# Patient Record
Sex: Male | Born: 1950 | Race: Black or African American | Hispanic: No | Smoking: Never smoker
Health system: Southern US, Community
[De-identification: ages and names within clinical notes are randomized; demographics above are authoritative.]

## PROBLEM LIST (undated history)

## (undated) DIAGNOSIS — M545 Low back pain, unspecified: Secondary | ICD-10-CM

## (undated) DIAGNOSIS — Z1211 Encounter for screening for malignant neoplasm of colon: Secondary | ICD-10-CM

## (undated) DIAGNOSIS — T7840XA Allergy, unspecified, initial encounter: Secondary | ICD-10-CM

## (undated) DIAGNOSIS — N183 Chronic kidney disease, stage 3 unspecified: Secondary | ICD-10-CM

## (undated) DIAGNOSIS — C182 Malignant neoplasm of ascending colon: Secondary | ICD-10-CM

## (undated) DIAGNOSIS — I639 Cerebral infarction, unspecified: Secondary | ICD-10-CM

## (undated) DIAGNOSIS — M169 Osteoarthritis of hip, unspecified: Secondary | ICD-10-CM

## (undated) DIAGNOSIS — I10 Essential (primary) hypertension: Secondary | ICD-10-CM

## (undated) DIAGNOSIS — A6000 Herpesviral infection of urogenital system, unspecified: Secondary | ICD-10-CM

## (undated) DIAGNOSIS — I129 Hypertensive chronic kidney disease with stage 1 through stage 4 chronic kidney disease, or unspecified chronic kidney disease: Secondary | ICD-10-CM

## (undated) DIAGNOSIS — E785 Hyperlipidemia, unspecified: Secondary | ICD-10-CM

## (undated) DIAGNOSIS — R1031 Right lower quadrant pain: Secondary | ICD-10-CM

## (undated) DIAGNOSIS — M1990 Unspecified osteoarthritis, unspecified site: Secondary | ICD-10-CM

## (undated) DIAGNOSIS — G8929 Other chronic pain: Secondary | ICD-10-CM

## (undated) HISTORY — DX: Low back pain: M54.5

## (undated) HISTORY — DX: Herpesviral infection of urogenital system, unspecified: A60.00

## (undated) HISTORY — DX: Chronic kidney disease, stage 3 unspecified: N18.30

## (undated) HISTORY — DX: Right lower quadrant pain: R10.31

## (undated) HISTORY — DX: Unspecified osteoarthritis, unspecified site: M19.90

## (undated) HISTORY — PX: COLONOSCOPY: SHX174

## (undated) HISTORY — DX: Hyperlipidemia, unspecified: E78.5

## (undated) HISTORY — DX: Allergy, unspecified, initial encounter: T78.40XA

## (undated) HISTORY — DX: Encounter for screening for malignant neoplasm of colon: Z12.11

## (undated) HISTORY — DX: Low back pain, unspecified: M54.50

## (undated) HISTORY — DX: Other chronic pain: G89.29

## (undated) HISTORY — DX: Essential (primary) hypertension: I10

## (undated) HISTORY — DX: Malignant neoplasm of ascending colon: C18.2

## (undated) HISTORY — DX: Hypertensive chronic kidney disease with stage 1 through stage 4 chronic kidney disease, or unspecified chronic kidney disease: I12.9

## (undated) HISTORY — DX: Osteoarthritis of hip, unspecified: M16.9

---

## 1993-08-28 DIAGNOSIS — K219 Gastro-esophageal reflux disease without esophagitis: Secondary | ICD-10-CM

## 1993-08-28 HISTORY — DX: Gastro-esophageal reflux disease without esophagitis: K21.9

## 2007-06-05 ENCOUNTER — Ambulatory Visit: Payer: Self-pay | Admitting: Gastroenterology

## 2007-06-19 ENCOUNTER — Ambulatory Visit: Payer: Self-pay | Admitting: General Surgery

## 2007-06-26 ENCOUNTER — Inpatient Hospital Stay: Payer: Self-pay | Admitting: General Surgery

## 2007-06-26 HISTORY — PX: COLON SURGERY: SHX602

## 2007-08-02 ENCOUNTER — Emergency Department: Payer: Self-pay | Admitting: Emergency Medicine

## 2009-08-28 DIAGNOSIS — Z1211 Encounter for screening for malignant neoplasm of colon: Secondary | ICD-10-CM

## 2009-08-28 DIAGNOSIS — C182 Malignant neoplasm of ascending colon: Secondary | ICD-10-CM

## 2009-08-28 HISTORY — DX: Malignant neoplasm of ascending colon: C18.2

## 2009-08-28 HISTORY — DX: Encounter for screening for malignant neoplasm of colon: Z12.11

## 2010-05-17 ENCOUNTER — Ambulatory Visit: Payer: Self-pay | Admitting: General Surgery

## 2010-05-20 LAB — PATHOLOGY REPORT

## 2011-02-03 ENCOUNTER — Ambulatory Visit: Payer: Self-pay | Admitting: Family Medicine

## 2013-03-10 ENCOUNTER — Encounter: Payer: Self-pay | Admitting: *Deleted

## 2013-03-10 DIAGNOSIS — C182 Malignant neoplasm of ascending colon: Secondary | ICD-10-CM | POA: Insufficient documentation

## 2014-01-06 ENCOUNTER — Ambulatory Visit: Payer: Self-pay | Admitting: Family Medicine

## 2015-01-29 ENCOUNTER — Other Ambulatory Visit: Payer: Self-pay | Admitting: Family Medicine

## 2015-02-20 ENCOUNTER — Other Ambulatory Visit: Payer: Self-pay | Admitting: Family Medicine

## 2015-02-23 ENCOUNTER — Other Ambulatory Visit: Payer: Self-pay | Admitting: Family Medicine

## 2015-03-09 ENCOUNTER — Telehealth: Payer: Self-pay | Admitting: Family Medicine

## 2015-03-09 NOTE — Telephone Encounter (Signed)
Pt is needing RX for his RamIpril (BLOOD PRESSURE). He said that he did have a prescription put accidentally thrown  it away. Pharm is [facility] [place]

## 2015-03-09 NOTE — Telephone Encounter (Signed)
[facility] to [facility]/15 with 1 refill. Spoke with pharmacist and she said that he has a refill on it. Please inform pt,thank you.

## 2015-03-09 NOTE — Telephone Encounter (Signed)
PT NOTIFIED OF MESSAGE BUT SAID SINCE IT WOULD BE TO EARLY TO REFILL DID NOT KNOW IF HE COULD OR NOT. GOING TO PHARM

## 2015-05-03 ENCOUNTER — Other Ambulatory Visit: Payer: Self-pay | Admitting: Family Medicine

## 2015-05-05 ENCOUNTER — Other Ambulatory Visit: Payer: Self-pay | Admitting: Family Medicine

## 2015-05-12 ENCOUNTER — Telehealth: Payer: Self-pay | Admitting: Family Medicine

## 2015-05-12 NOTE — Telephone Encounter (Signed)
Last seen: 12-11-14 and new appointment is for next week. He will be completely out of Temazepam by the weekend and is requesting that a refill be sent to [facility].

## 2015-05-13 ENCOUNTER — Telehealth: Payer: Self-pay | Admitting: Emergency Medicine

## 2015-05-13 MED ORDER — TEMAZEPAM 30 MG PO CAPS: ORAL_CAPSULE | ORAL | Status: DC

## 2015-05-13 NOTE — Telephone Encounter (Signed)
Script faxed to pharmacy for restoril

## 2015-05-17 ENCOUNTER — Encounter: Payer: Self-pay | Admitting: Family Medicine

## 2015-05-20 ENCOUNTER — Encounter: Payer: Self-pay | Admitting: Family Medicine

## 2015-06-08 ENCOUNTER — Encounter: Payer: Self-pay | Admitting: General Surgery

## 2015-06-09 ENCOUNTER — Encounter: Payer: Self-pay | Admitting: General Surgery

## 2015-06-09 ENCOUNTER — Ambulatory Visit (INDEPENDENT_AMBULATORY_CARE_PROVIDER_SITE_OTHER): Payer: Commercial Indemnity | Admitting: General Surgery

## 2015-06-09 VITALS — BP 121/68 | HR 62 | Resp 12 | Ht 71.0 in | Wt 189.0 lb

## 2015-06-09 DIAGNOSIS — Z85038 Personal history of other malignant neoplasm of large intestine: Secondary | ICD-10-CM | POA: Diagnosis not present

## 2015-06-09 MED ORDER — POLYETHYLENE GLYCOL 3350 17 GM/SCOOP PO POWD: ORAL | Status: DC

## 2015-06-09 NOTE — Patient Instructions (Addendum)
Colonoscopy A colonoscopy is an exam to look at the entire large intestine (colon). This exam can help find problems such as tumors, polyps, inflammation, and areas of bleeding. The exam takes about 1 hour.  LET [facility] CARE PROVIDER KNOW ABOUT:   Any allergies you have.  All medicines you are taking, including vitamins, herbs, eye drops, creams, and over-the-counter medicines.  Previous problems you or members of your family have had with the use of anesthetics.  Any blood disorders you have.  Previous surgeries you have had.  Medical conditions you have. RISKS AND COMPLICATIONS  Generally, this is a safe procedure. However, as with any procedure, complications can occur. Possible complications include:  Bleeding.  Tearing or rupture of the colon wall.  Reaction to medicines given during the exam.  Infection (rare). BEFORE THE PROCEDURE   Ask your health care provider about changing or stopping your regular medicines.  You may be prescribed an oral bowel prep. This involves drinking a large amount of medicated liquid, starting the day before your procedure. The liquid will cause you to have multiple loose stools until your stool is almost clear or light green. This cleans out your colon in preparation for the procedure.  Do not eat or drink anything else once you have started the bowel prep, unless your health care provider tells you it is safe to do so.  Arrange for someone to drive you home after the procedure. PROCEDURE   You will be given medicine to help you relax (sedative).  You will lie on your side with your knees bent.  A long, flexible tube with a light and camera on the end (colonoscope) will be inserted through the rectum and into the colon. The camera sends video back to a computer screen as it moves through the colon. The colonoscope also releases carbon dioxide gas to inflate the colon. This helps your health care provider see the area better.  During  the exam, your health care provider may take a small tissue sample (biopsy) to be examined under a microscope if any abnormalities are found.  The exam is finished when the entire colon has been viewed. AFTER THE PROCEDURE   Do not drive for 24 hours after the exam.  You may have a small amount of blood in your stool.  You may pass moderate amounts of gas and have mild abdominal cramping or bloating. This is caused by the gas used to inflate your colon during the exam.  Ask when your test results will be ready and how you will get your results. Make sure you get your test results.   This information is not intended to replace advice given to you by your health care provider. Make sure you discuss any questions you have with your health care provider.   Document Released: 08/11/2000 Document Revised: 06/04/2013 Document Reviewed: 04/21/2013 Elsevier Interactive Patient Education [employer].  Patient has been scheduled for a colonoscopy on 08-04-15 at [facility].

## 2015-06-09 NOTE — Progress Notes (Signed)
Patient ID: Sundiata Ferrick, male   DOB: 17-Mar-1951, 64 y.o.   MRN: [id number]  Chief Complaint  Patient presents with  . Other    colonoscopy    HPI Kingson Lohmeyer is a 64 y.o. male here today for a evaluation of a colonoscopy. Last colonoscopy was done in 05/17/2010. He states he has been having some right side pain for about 2 weeks started 05/23/15, the pain stopped last week. Nothing triggered the pain, he noticed when he pushed on it was tender. The patient does notice after eating spicy foods or choloclate he will have acid reflux. He moves his bowels daily, he reports once every 6 weeks he will have 2 or more loose stools. This pattern has been present for a couple of years without progression.  HPI  Past Medical History  Diagnosis Date  . Hypertension   . Arthritis   . Abdominal pain, right lower quadrant   . Malignant neoplasm of ascending colon ([place]) 2011  . Special screening for malignant neoplasms, colon 2011  . Degenerative joint disease (DJD) of hip     Past Surgical History  Procedure Laterality Date  . Colonoscopy  0175,1025  . Colon surgery  06/26/2007    right hemicolectomy-T2N0 carcinoma of right colon. The tumor was 2.6 cm,low grade with zero of 14 nodes positive. No evidence of venous or lymphatic invasion    Family History  Problem Relation Age of Onset  . Cancer Brother     cancer of the thymus  . Cancer Maternal Aunt     breast cancer    Social History Social History  Substance Use Topics  . Smoking status: Never Smoker   . Smokeless tobacco: None  . Alcohol Use: 0.6 - 1.2 oz/week    1-2 Standard drinks or equivalent per week     Comment: drinks rarely    Allergies  Allergen Reactions  . Ciprofloxacin     Tendon damage    Current Outpatient Prescriptions  Medication Sig Dispense Refill  . amLODipine-benazepril (LOTREL) 10-20 MG capsule Take 1 capsule by mouth daily.    . ramipril (ALTACE) 10 MG capsule TAKE ONE CAPSULE BY MOUTH  TWICE A DAY 90 capsule 1  . temazepam (RESTORIL) 30 MG capsule TAKE 1 TABLET BY MOUTH AT BEDTIME 30 capsule 0  . valACYclovir (VALTREX) 1000 MG tablet Take 1,000 mg by mouth daily.  1  . polyethylene glycol powder (GLYCOLAX/MIRALAX) powder 255 grams one bottle for colonoscopy prep 255 g 0   No current facility-administered medications for this visit.    Review of Systems Review of Systems  Constitutional: Negative.   Respiratory: Negative.   Cardiovascular: Negative.   Gastrointestinal: Negative.     Blood pressure 121/68, pulse 62, resp. rate 12, height 5\' 11"  (1.803 m), weight 189 lb (85.73 kg).  Physical Exam Physical Exam  Constitutional: He is oriented to person, place, and time. He appears well-developed and well-nourished.  HENT:  Mouth/Throat: Oropharynx is clear and moist.  Eyes: Conjunctivae are normal. No scleral icterus.  Cardiovascular: Normal rate and regular rhythm.   Pulmonary/Chest: Effort normal and breath sounds normal.  Abdominal: Soft. Bowel sounds are normal. There is tenderness (minimal right lower quadrant).  Thrombosed vein right anterior abdominal wall.  Neurological: He is alert and oriented to person, place, and time.  Skin: Skin is warm and dry.    Data Reviewed 2011 colonoscopy.  Assessment    Candidate for follow-up colonoscopy. History intermittent diarrhea.    Plan  Biopsies of the colon will be obtained to assess for colitis, although it's difficult to explain 6 week intervals between episodes.     Colonoscopy with possible biopsy/polypectomy prn: Information regarding the procedure, including its potential risks and complications (including but not limited to perforation of the bowel, which may require emergency surgery to repair, and bleeding) was verbally given to the patient. Educational information regarding lower intestinal endoscopy was given to the patient. Written instructions for how to complete the bowel prep using Miralax  were provided. The importance of drinking ample fluids to avoid dehydration as a result of the prep emphasized.  Patient has been scheduled for a colonoscopy on 08-04-15 at [facility].   PCP:  Mamie Laurel 06/10/2015, 6:17 AM

## 2015-06-10 ENCOUNTER — Encounter: Payer: Self-pay | Admitting: General Surgery

## 2015-06-10 DIAGNOSIS — Z85038 Personal history of other malignant neoplasm of large intestine: Secondary | ICD-10-CM | POA: Insufficient documentation

## 2015-06-11 ENCOUNTER — Other Ambulatory Visit: Payer: Self-pay | Admitting: Family Medicine

## 2015-06-15 ENCOUNTER — Other Ambulatory Visit: Payer: Self-pay | Admitting: Family Medicine

## 2015-06-16 NOTE — Telephone Encounter (Signed)
Left patient a voice message to return call to schedule appointment

## 2015-06-18 ENCOUNTER — Ambulatory Visit (INDEPENDENT_AMBULATORY_CARE_PROVIDER_SITE_OTHER): Payer: Managed Care, Other (non HMO) | Admitting: Family Medicine

## 2015-06-18 ENCOUNTER — Encounter: Payer: Self-pay | Admitting: Family Medicine

## 2015-06-18 VITALS — BP 118/68 | HR 62 | Temp 97.8°F | Resp 16 | Ht 71.0 in | Wt 189.1 lb

## 2015-06-18 DIAGNOSIS — Z1211 Encounter for screening for malignant neoplasm of colon: Secondary | ICD-10-CM | POA: Diagnosis not present

## 2015-06-18 DIAGNOSIS — Z23 Encounter for immunization: Secondary | ICD-10-CM | POA: Diagnosis not present

## 2015-06-18 DIAGNOSIS — Z0000 Encounter for general adult medical examination without abnormal findings: Secondary | ICD-10-CM

## 2015-06-18 MED ORDER — TEMAZEPAM 30 MG PO CAPS: 30.0000 mg | ORAL_CAPSULE | Freq: Every day | ORAL | Status: DC

## 2015-06-18 NOTE — Patient Instructions (Signed)
Irritable Bowel Syndrome, Adult Irritable bowel syndrome (IBS) is not one specific disease. It is a group of symptoms that affects the organs responsible for digestion (gastrointestinal or GI tract).  To regulate how your GI tract works, your body sends signals back and forth between your intestines and your brain. If you have IBS, there may be a problem with these signals. As a result, your GI tract does not function normally. Your intestines may become more sensitive and overreact to certain things. This is especially true when you eat certain foods or when you are under stress.  There are four types of IBS. These may be determined based on the consistency of your stool:   IBS with diarrhea.   IBS with constipation.   Mixed IBS.   Unsubtyped IBS.  It is important to know which type of IBS you have. Some treatments are more likely to be helpful for certain types of IBS.  CAUSES  The exact cause of IBS is not known. RISK FACTORS You may have a higher risk of IBS if:  You are a woman.  You are younger than 63 years old.  You have a family history of IBS.  You have mental health problems.  You have had bacterial infection of your GI tract. SIGNS AND SYMPTOMS  Symptoms of IBS vary from person to person. The main symptom is abdominal pain or discomfort. Additional symptoms usually include one or more of the following:   Diarrhea, constipation, or both.   Abdominal swelling or bloating.   Feeling full or sick after eating a small or regular-size meal.   Frequent gas.   Mucus in the stool.   A feeling of having more stool left after a bowel movement.  Symptoms tend to come and go. They may be associated with stress, psychiatric conditions, or nothing at all.  DIAGNOSIS  There is no specific test to diagnose IBS. Your health care provider will make a diagnosis based on a physical exam, medical history, and your symptoms. You may have other tests to rule out other  conditions that may be causing your symptoms. These may include:   Blood tests.   X-rays.   CT scan.  Endoscopy and colonoscopy. This is a test in which your GI tract is viewed with a long, thin, flexible tube. TREATMENT There is no cure for IBS, but treatment can help relieve symptoms. IBS treatment often includes:   Changes to your diet, such as:  Eating more fiber.  Avoiding foods that cause symptoms.  Drinking more water.  Eating regular, medium-sized portioned meals.  Medicines. These may include:  Fiber supplements if you have constipation.  Medicine to control diarrhea (antidiarrheal medicines).  Medicine to help control muscle spasms in your GI tract (antispasmodic medicines).  Medicines to help with any mental health issues, such as antidepressants or tranquilizers.  Therapy.  Talk therapy may help with anxiety, depression, or other mental health issues that can make IBS symptoms worse.  Stress reduction.  Managing your stress can help keep symptoms under control. HOME CARE INSTRUCTIONS   Take medicines only as directed by your health care provider.  Eat a healthy diet.  Avoid foods and drinks with added sugar.  Include more whole grains, fruits, and vegetables gradually into your diet. This may be especially helpful if you have IBS with constipation.  Avoid any foods and drinks that make your symptoms worse. These may include dairy products and caffeinated or carbonated drinks.  Do not eat large meals.    Drink enough fluid to keep your urine clear or pale yellow.  Exercise regularly. Ask your health care provider for recommendations of good activities for you.  Keep all follow-up visits as directed by your health care provider. This is important. SEEK MEDICAL CARE IF:   You have constant pain.  You have trouble or pain with swallowing.  You have worsening diarrhea. SEEK IMMEDIATE MEDICAL CARE IF:   You have severe and worsening abdominal  pain.   You have diarrhea and:   You have a rash, stiff neck, or severe headache.   You are irritable, sleepy, or difficult to awaken.   You are weak, dizzy, or extremely thirsty.   You have bright red blood in your stool or you have black tarry stools.   You have unusual abdominal swelling that is painful.   You vomit continuously.   You vomit blood (hematemesis).   You have both abdominal pain and a fever.    This information is not intended to replace advice given to you by your health care provider. Make sure you discuss any questions you have with your health care provider.   Document Released: 08/14/2005 Document Revised: 09/04/2014 Document Reviewed: 05/01/2014 Elsevier Interactive Patient Education 2016 Elsevier Inc.  

## 2015-06-18 NOTE — Addendum Note (Signed)
Addended by: Ashok Norris on: 06/18/2015 10:24 AM   Modules accepted: Orders, SmartSet

## 2015-06-18 NOTE — Progress Notes (Signed)
Name: Frederick Nelson   MRN: [id number]    DOB: 1951/01/07   Date:06/18/2015       Progress Note  Subjective  Chief Complaint  Chief Complaint  Patient presents with  . Annual Exam    HPI   Patient here for  annualH&P.    Baseline problems are stable.  Past Medical History  Diagnosis Date  . Hypertension   . Arthritis   . Abdominal pain, right lower quadrant   . Malignant neoplasm of ascending colon ([place]) 2011  . Special screening for malignant neoplasms, colon 2011  . Degenerative joint disease (DJD) of hip     Social History  Substance Use Topics  . Smoking status: Never Smoker   . Smokeless tobacco: Not on file  . Alcohol Use: 0.6 - 1.2 oz/week    1-2 Standard drinks or equivalent per week     Comment: drinks rarely     Current outpatient prescriptions:  .  amLODipine (NORVASC) 5 MG tablet, TAKE 1 TABLET BY MOUTH DAILY, Disp: 30 tablet, Rfl: 7 .  temazepam (RESTORIL) 30 MG capsule, TAKE 1 CAPSULE BY MOUTH AT BEDTIME, Disp: 30 capsule, Rfl: 0 .  valACYclovir (VALTREX) 1000 MG tablet, TAKE 1 TABLET BY MOUTH EVERY DAY, Disp: 90 tablet, Rfl: 0 .  amLODipine-benazepril (LOTREL) 10-20 MG capsule, Take 1 capsule by mouth daily., Disp: , Rfl:  .  polyethylene glycol powder (GLYCOLAX/MIRALAX) powder, 255 grams one bottle for colonoscopy prep, Disp: 255 g, Rfl: 0 .  ramipril (ALTACE) 10 MG capsule, TAKE ONE CAPSULE BY MOUTH TWICE A DAY, Disp: 90 capsule, Rfl: 1  Allergies  Allergen Reactions  . Ciprofloxacin     Tendon damage    Review of Systems  Constitutional: Negative for fever, chills and weight loss.  HENT: Negative for congestion, hearing loss, sore throat and tinnitus.   Eyes: Negative for blurred vision, double vision and redness.  Respiratory: Negative for cough, hemoptysis and shortness of breath.   Cardiovascular: Negative for chest pain, palpitations, orthopnea, claudication and leg swelling.  Gastrointestinal: Negative for heartburn, nausea,  vomiting, diarrhea, constipation and blood in stool.  Genitourinary: Negative for dysuria, urgency, frequency and hematuria.       ED  Musculoskeletal: Negative for myalgias, back pain, joint pain, falls and neck pain.  Skin: Negative for itching.  Neurological: Negative for dizziness, tingling, tremors, focal weakness, seizures, loss of consciousness, weakness and headaches.  Endo/Heme/Allergies: Does not bruise/bleed easily.  Psychiatric/Behavioral: Negative for depression and substance abuse. The patient has insomnia. The patient is not nervous/anxious.      Objective  Filed Vitals:   06/18/15 0831  BP: 118/68  Pulse: 62  Temp: 97.8 F (36.6 C)  Resp: 16  Height: 5\' 11"  (1.803 m)  Weight: 189 lb 1 oz (85.758 kg)  SpO2: 99%     Physical Exam  Constitutional: He is oriented to person, place, and time and well-developed, well-nourished, and in no distress.  HENT:  Head: Normocephalic.  Eyes: EOM are normal. Pupils are equal, round, and reactive to light.  Neck: Normal range of motion. Neck supple. No thyromegaly present.  Cardiovascular: Normal rate, regular rhythm and normal heart sounds.   No murmur heard. Pulmonary/Chest: Effort normal and breath sounds normal. No respiratory distress. He has no wheezes.  Abdominal: Soft. Bowel sounds are normal.  Genitourinary: Rectum normal, prostate normal and penis normal. Guaiac negative stool. No discharge found.  Musculoskeletal: Normal range of motion. He exhibits no edema.  Lymphadenopathy:  He has no cervical adenopathy.  Neurological: He is alert and oriented to person, place, and time. No cranial nerve deficit. Gait normal. Coordination normal.  Skin: Skin is warm and dry. No rash noted.  Psychiatric: Affect and judgment normal.      Assessment & Plan  1. Annual physical exam  - Comprehensive Metabolic Panel (CMET) - CBC - Lipid Profile - PSA - TSH  2. Need for influenza vaccination Given - Flu Vaccine QUAD  36+ mos PF IM (Fluarix & Fluzone Quad PF)

## 2015-06-19 LAB — CBC
Hematocrit: 44.5 % (ref 37.5–51.0)
Hemoglobin: 15.1 g/dL (ref 12.6–17.7)
MCH: 31.6 pg (ref 26.6–33.0)
MCHC: 33.9 g/dL (ref 31.5–35.7)
MCV: 93 fL (ref 79–97)
PLATELETS: 168 10*3/uL (ref 150–379)
RBC: 4.78 x10E6/uL (ref 4.14–5.80)
RDW: 13 % (ref 12.3–15.4)
WBC: 4.3 10*3/uL (ref 3.4–10.8)

## 2015-06-19 LAB — COMPREHENSIVE METABOLIC PANEL
A/G RATIO: 1.8 (ref 1.1–2.5)
ALBUMIN: 4.4 g/dL (ref 3.6–4.8)
ALK PHOS: 60 IU/L (ref 39–117)
ALT: 20 IU/L (ref 0–44)
AST: 20 IU/L (ref 0–40)
BILIRUBIN TOTAL: 0.5 mg/dL (ref 0.0–1.2)
BUN / CREAT RATIO: 11 (ref 10–22)
BUN: 17 mg/dL (ref 8–27)
CHLORIDE: 103 mmol/L (ref 97–106)
CO2: 26 mmol/L (ref 18–29)
CREATININE: 1.56 mg/dL — AB (ref 0.76–1.27)
Calcium: 9.3 mg/dL (ref 8.6–10.2)
GFR calc Af Amer: 54 mL/min/{1.73_m2} — ABNORMAL LOW (ref >59)
GFR calc non Af Amer: 47 mL/min/{1.73_m2} — ABNORMAL LOW (ref >59)
GLOBULIN, TOTAL: 2.4 g/dL (ref 1.5–4.5)
Glucose: 90 mg/dL (ref 65–99)
POTASSIUM: 4.7 mmol/L (ref 3.5–5.2)
SODIUM: 142 mmol/L (ref 136–144)
Total Protein: 6.8 g/dL (ref 6.0–8.5)

## 2015-06-19 LAB — LIPID PANEL
CHOLESTEROL TOTAL: 173 mg/dL (ref 100–199)
Chol/HDL Ratio: 2.8 ratio units (ref 0.0–5.0)
HDL: 62 mg/dL (ref >39)
LDL Calculated: 99 mg/dL (ref 0–99)
Triglycerides: 59 mg/dL (ref 0–149)
VLDL Cholesterol Cal: 12 mg/dL (ref 5–40)

## 2015-06-19 LAB — TSH: TSH: 1.91 u[IU]/mL (ref 0.450–4.500)

## 2015-06-19 LAB — PSA: PROSTATE SPECIFIC AG, SERUM: 1.1 ng/mL (ref 0.0–4.0)

## 2015-07-09 ENCOUNTER — Other Ambulatory Visit: Payer: Self-pay | Admitting: Family Medicine

## 2015-07-10 ENCOUNTER — Other Ambulatory Visit: Payer: Self-pay | Admitting: Family Medicine

## 2015-07-28 ENCOUNTER — Telehealth: Payer: Self-pay | Admitting: *Deleted

## 2015-07-28 NOTE — Telephone Encounter (Signed)
Patient was contacted today to confirm medications. Medication list was updated accordingly per patient.  He states he has yet to pick up Miralax but was instructed to do so.   We will proceed with colonoscopy as scheduled for 08-04-15 at [facility].  Patient was instructed to call the office if he has further questions.

## 2015-08-03 ENCOUNTER — Encounter: Payer: Self-pay | Admitting: *Deleted

## 2015-08-04 ENCOUNTER — Encounter: Payer: Self-pay | Admitting: Anesthesiology

## 2015-08-04 ENCOUNTER — Encounter
Admission: RE | Disposition: A | Discharge status: Home / Self Care | Payer: Self-pay | Source: Ambulatory Visit | Attending: General Surgery

## 2015-08-04 ENCOUNTER — Ambulatory Visit: Payer: Managed Care, Other (non HMO) | Admitting: Anesthesiology

## 2015-08-04 ENCOUNTER — Ambulatory Visit
Admission: RE | Admit: 2015-08-04 | Discharge: 2015-08-04 | Disposition: A | Discharge status: Home / Self Care | Payer: Managed Care, Other (non HMO) | Source: Ambulatory Visit | Attending: General Surgery | Admitting: General Surgery

## 2015-08-04 DIAGNOSIS — M1990 Unspecified osteoarthritis, unspecified site: Secondary | ICD-10-CM | POA: Diagnosis not present

## 2015-08-04 DIAGNOSIS — I10 Essential (primary) hypertension: Secondary | ICD-10-CM | POA: Diagnosis not present

## 2015-08-04 DIAGNOSIS — Z79899 Other long term (current) drug therapy: Secondary | ICD-10-CM | POA: Diagnosis not present

## 2015-08-04 DIAGNOSIS — Z85038 Personal history of other malignant neoplasm of large intestine: Secondary | ICD-10-CM | POA: Insufficient documentation

## 2015-08-04 DIAGNOSIS — M1610 Unilateral primary osteoarthritis, unspecified hip: Secondary | ICD-10-CM | POA: Diagnosis not present

## 2015-08-04 DIAGNOSIS — Z9049 Acquired absence of other specified parts of digestive tract: Secondary | ICD-10-CM | POA: Insufficient documentation

## 2015-08-04 DIAGNOSIS — Z881 Allergy status to other antibiotic agents status: Secondary | ICD-10-CM | POA: Diagnosis not present

## 2015-08-04 HISTORY — PX: COLONOSCOPY WITH PROPOFOL: SHX5780

## 2015-08-04 SURGERY — COLONOSCOPY WITH PROPOFOL
Anesthesia: General

## 2015-08-04 MED ORDER — SODIUM CHLORIDE 0.9 % IV SOLN
INTRAVENOUS | Status: DC
  Administered 2015-08-04: 10:00:00 via INTRAVENOUS

## 2015-08-04 MED ORDER — FENTANYL CITRATE (PF) 100 MCG/2ML IJ SOLN
INTRAMUSCULAR | Status: DC | PRN
  Administered 2015-08-04: 50 ug via INTRAVENOUS

## 2015-08-04 MED ORDER — MIDAZOLAM HCL 2 MG/2ML IJ SOLN
INTRAMUSCULAR | Status: DC | PRN
  Administered 2015-08-04: 1 mg via INTRAVENOUS

## 2015-08-04 MED ORDER — PROPOFOL 500 MG/50ML IV EMUL
INTRAVENOUS | Status: DC | PRN
  Administered 2015-08-04: 120 ug/kg/min via INTRAVENOUS

## 2015-08-04 NOTE — Anesthesia Procedure Notes (Signed)
Performed by: COOK-MARTIN, Shizue Kaseman Pre-anesthesia Checklist: Patient identified, Emergency Drugs available, Suction available, Patient being monitored and Timeout performed Patient Re-evaluated:Patient Re-evaluated prior to inductionOxygen Delivery Method: Nasal cannula Preoxygenation: Pre-oxygenation with 100% oxygen Intubation Type: IV induction Placement Confirmation: positive ETCO2 and CO2 detector       

## 2015-08-04 NOTE — H&P (Signed)
Frederick Nelson [id number] [birth year]     HPI: Prior right hemicolectomy for cancer. Followup exam.   Prescriptions prior to admission  Medication Sig Dispense Refill Last Dose  . amLODipine (NORVASC) 5 MG tablet TAKE 1 TABLET BY MOUTH DAILY 30 tablet 7 08/04/2015 at 0530  . ramipril (ALTACE) 10 MG capsule TAKE ONE CAPSULE BY MOUTH TWICE A DAY 90 capsule 0 08/04/2015 at [place] time  . CIALIS 20 MG tablet TAKE 1 TABLET BY MOUTH AS NEEDED 90 tablet 0   . polyethylene glycol powder (GLYCOLAX/MIRALAX) powder 255 grams one bottle for colonoscopy prep 255 g 0   . temazepam (RESTORIL) 30 MG capsule Take 1 capsule (30 mg total) by mouth at bedtime. 30 capsule 5   . valACYclovir (VALTREX) 1000 MG tablet TAKE 1 TABLET BY MOUTH EVERY DAY 90 tablet 0 Taking   Allergies  Allergen Reactions  . Ciprofloxacin     Tendon damage   Past Medical History  Diagnosis Date  . Hypertension   . Arthritis   . Abdominal pain, right lower quadrant   . Malignant neoplasm of ascending colon ([place]) 2011  . Special screening for malignant neoplasms, colon 2011  . Degenerative joint disease (DJD) of hip    Past Surgical History  Procedure Laterality Date  . Colonoscopy  [id number]  . Colon surgery  06/26/2007    right hemicolectomy-T2N0 carcinoma of right colon. The tumor was 2.6 cm,low grade with zero of 14 nodes positive. No evidence of venous or lymphatic invasion   Social History   Social History  . Marital Status: Divorced    Spouse Name: N/A  . Number of Children: N/A  . Years of Education: N/A   Occupational History  . Not on file.   Social History Main Topics  . Smoking status: Never Smoker   . Smokeless tobacco: Never Used  . Alcohol Use: 0.6 - 1.2 oz/week    1-2 Standard drinks or equivalent per week     Comment: drinks rarely  . Drug Use: No  . Sexual Activity: Not on file   Other Topics Concern  . Not on file   Social History Narrative   Social History   Social History  Narrative     ROS: Negative.     PE: HEENT: Negative. Lungs: Clear. Cardio: RR. [name] 08/04/2015   Assessment/Plan:  Proceed with planned endoscopy.

## 2015-08-04 NOTE — Op Note (Signed)
[facility] Patient Name: Frederick Nelson Procedure Date: 08/04/2015 10:26 AM MRN: [id number] Account #: [id number] Date of Birth: 11-27-1950 Admit Type: Outpatient Age: 64 Room: [facility] -  ENDO ROOM 1 Gender: Male Note Status: Finalized Procedure:         Colonoscopy Indications:       High risk colon cancer surveillance: Personal history of                     colon cancer Providers:         [name], MD Referring MD:      Ashok Norris, MD (Referring MD) Medicines:         Monitored Anesthesia Care Complications:     No immediate complications. Procedure:         Pre-Anesthesia Assessment:                    - Prior to the procedure, a History and Physical was                     performed, and patient medications, allergies and                     sensitivities were reviewed. The patient's tolerance of                     previous anesthesia was reviewed.                    - The risks and benefits of the procedure and the sedation                     options and risks were discussed with the patient. All                     questions were answered and informed consent was obtained.                    After obtaining informed consent, the colonoscope was                     passed under direct vision. Throughout the procedure, the                     patient's blood pressure, pulse, and oxygen saturations                     were monitored continuously. The Olympus CF-H180AL                     colonoscope ( S#: [id number] ) was introduced through the                     anus and advanced to the the ileocolonic anastomosis. The                     colonoscopy was performed without difficulty. The patient                     tolerated the procedure well. The quality of the bowel                     preparation was excellent. Findings:      The entire examined colon appeared normal on direct and retroflexion  views. Impression:         - The entire examined colon is normal on direct and                     retroflexion views.                    - No specimens collected. Recommendation:    - Repeat colonoscopy in 5 years for surveillance. Procedure Code(s): --- Professional ---                    [phone], Colonoscopy, flexible; diagnostic, including                     collection of specimen(s) by brushing or washing, when                     performed (separate procedure) Diagnosis Code(s): --- Professional ---                    [id number], Personal history of other malignant neoplasm of                     large intestine CPT copyright 2014 American Medical Association. All rights reserved. The codes documented in this report are preliminary and upon coder review may  be revised to meet current compliance requirements. [name], MD 08/04/2015 10:53:48 AM This report has been signed electronically. Number of Addenda: 0 Note Initiated On: 08/04/2015 10:26 AM Scope Withdrawal Time: 0 hours 7 minutes 22 seconds  Total Procedure Duration: 0 hours 11 minutes 28 seconds       [facility]

## 2015-08-04 NOTE — Anesthesia Postprocedure Evaluation (Signed)
Anesthesia Post Note  Patient: Frederick Nelson  Procedure(s) Performed: Procedure(s) (LRB): COLONOSCOPY WITH PROPOFOL (N/A)  Patient location during evaluation: Endoscopy Anesthesia Type: General Level of consciousness: awake and alert Pain management: pain level controlled Vital Signs Assessment: post-procedure vital signs reviewed and stable Respiratory status: spontaneous breathing, nonlabored ventilation, respiratory function stable and patient connected to nasal cannula oxygen Cardiovascular status: blood pressure returned to baseline and stable Postop Assessment: no signs of nausea or vomiting Anesthetic complications: no    Last Vitals:  Filed Vitals:   08/04/15 1115 08/04/15 1125  BP: 140/74 147/92  Pulse: 55 50  Temp:    Resp: 13 15    Last Pain: There were no vitals filed for this visit.               Precious Haws Roshana Shuffield

## 2015-08-04 NOTE — Anesthesia Preprocedure Evaluation (Signed)
Anesthesia Evaluation  Patient identified by MRN, date of birth, ID band Patient awake    Reviewed: Allergy & Precautions, H&P , NPO status , Patient's Chart, lab work & pertinent test results  History of Anesthesia Complications Negative for: history of anesthetic complications  Airway Mallampati: III  TM Distance: >3 FB Neck ROM: full    Dental no notable dental hx. (+) Teeth Intact   Pulmonary neg pulmonary ROS, neg shortness of breath,    Pulmonary exam normal breath sounds clear to auscultation       Cardiovascular Exercise Tolerance: Good hypertension, (-) angina(-) Past MI and (-) DOE Normal cardiovascular exam Rhythm:regular Rate:Normal     Neuro/Psych negative neurological ROS  negative psych ROS   GI/Hepatic Neg liver ROS, GERD  Controlled,  Endo/Other  negative endocrine ROS  Renal/GU negative Renal ROS  negative genitourinary   Musculoskeletal   Abdominal   Peds  Hematology negative hematology ROS (+)   Anesthesia Other Findings Past Medical History:   Hypertension                                                 Arthritis                                                    Abdominal pain, right lower quadrant                         Malignant neoplasm of ascending colon ([place])     2011         Special screening for malignant neoplasms, col* 2011         Degenerative joint disease (DJD) of hip                     Past Surgical History:   COLONOSCOPY                                      2008,2011    COLON SURGERY                                    06/26/2007     Comment:right hemicolectomy-T2N0 carcinoma of right               colon. The tumor was 2.6 cm,low grade with zero              of 14 nodes positive. No evidence of venous or               lymphatic invasion     Reproductive/Obstetrics negative OB ROS                             Anesthesia Physical Anesthesia  Plan  ASA: III  Anesthesia Plan: General   Post-op Pain Management:    Induction:   Airway Management Planned:   Additional Equipment:   Intra-op Plan:   Post-operative Plan:   Informed Consent: I have reviewed the patients  History and Physical, chart, labs and discussed the procedure including the risks, benefits and alternatives for the proposed anesthesia with the patient or authorized representative who has indicated his/her understanding and acceptance.   Dental Advisory Given  Plan Discussed with: Anesthesiologist, CRNA and Surgeon  Anesthesia Plan Comments:         Anesthesia Quick Evaluation

## 2015-08-04 NOTE — Transfer of Care (Signed)
Immediate Anesthesia Transfer of Care Note  Patient: Frederick Nelson  Procedure(s) Performed: Procedure(s): COLONOSCOPY WITH PROPOFOL (N/A)  Patient Location: PACU  Anesthesia Type:General  Level of Consciousness: awake and sedated  Airway & Oxygen Therapy: Patient Spontanous Breathing and Patient connected to nasal cannula oxygen  Post-op Assessment: Report given to RN and Post -op Vital signs reviewed and stable  Post vital signs: Reviewed and stable  Last Vitals:  Filed Vitals:   08/04/15 1012  BP: 130/79  Pulse: 53  Temp: 36 C  Resp: 18    Complications: No apparent anesthesia complications

## 2015-08-15 ENCOUNTER — Other Ambulatory Visit: Payer: Self-pay | Admitting: Family Medicine

## 2015-09-27 ENCOUNTER — Other Ambulatory Visit: Payer: Self-pay | Admitting: Family Medicine

## 2015-09-28 NOTE — Telephone Encounter (Signed)
Patient requesting refill. 

## 2015-10-16 ENCOUNTER — Other Ambulatory Visit: Payer: Self-pay | Admitting: Family Medicine

## 2015-10-21 ENCOUNTER — Other Ambulatory Visit: Payer: Self-pay

## 2015-10-21 MED ORDER — RAMIPRIL 10 MG PO CAPS: 10.0000 mg | ORAL_CAPSULE | Freq: Two times a day (BID) | ORAL | Status: DC

## 2015-12-20 ENCOUNTER — Ambulatory Visit: Payer: Managed Care, Other (non HMO) | Admitting: Family Medicine

## 2015-12-27 ENCOUNTER — Other Ambulatory Visit: Payer: Self-pay | Admitting: Family Medicine

## 2015-12-27 ENCOUNTER — Ambulatory Visit: Payer: Managed Care, Other (non HMO) | Admitting: Family Medicine

## 2015-12-28 ENCOUNTER — Other Ambulatory Visit: Payer: Self-pay | Admitting: Family Medicine

## 2016-01-02 ENCOUNTER — Other Ambulatory Visit: Payer: Self-pay | Admitting: Family Medicine

## 2016-01-05 ENCOUNTER — Telehealth: Payer: Self-pay | Admitting: Family Medicine

## 2016-01-05 NOTE — Telephone Encounter (Signed)
Dan from [facility] requesting refill on Temazepam. Patient is completely out.

## 2016-01-15 ENCOUNTER — Other Ambulatory Visit: Payer: Self-pay | Admitting: Family Medicine

## 2016-01-26 ENCOUNTER — Encounter: Payer: Self-pay | Admitting: Family Medicine

## 2016-01-26 ENCOUNTER — Ambulatory Visit (INDEPENDENT_AMBULATORY_CARE_PROVIDER_SITE_OTHER): Payer: Managed Care, Other (non HMO) | Admitting: Family Medicine

## 2016-01-26 VITALS — BP 140/76 | HR 60 | Temp 98.1°F | Resp 16 | Ht 71.0 in | Wt 186.3 lb

## 2016-01-26 DIAGNOSIS — N183 Chronic kidney disease, stage 3 unspecified: Secondary | ICD-10-CM | POA: Insufficient documentation

## 2016-01-26 DIAGNOSIS — I10 Essential (primary) hypertension: Secondary | ICD-10-CM | POA: Diagnosis not present

## 2016-01-26 DIAGNOSIS — G4700 Insomnia, unspecified: Secondary | ICD-10-CM

## 2016-01-26 DIAGNOSIS — R600 Localized edema: Secondary | ICD-10-CM | POA: Diagnosis not present

## 2016-01-26 DIAGNOSIS — F5104 Psychophysiologic insomnia: Secondary | ICD-10-CM | POA: Insufficient documentation

## 2016-01-26 DIAGNOSIS — I129 Hypertensive chronic kidney disease with stage 1 through stage 4 chronic kidney disease, or unspecified chronic kidney disease: Secondary | ICD-10-CM | POA: Insufficient documentation

## 2016-01-26 MED ORDER — RAMIPRIL 10 MG PO CAPS: 10.0000 mg | ORAL_CAPSULE | Freq: Two times a day (BID) | ORAL | Status: DC

## 2016-01-26 MED ORDER — TEMAZEPAM 30 MG PO CAPS: 30.0000 mg | ORAL_CAPSULE | Freq: Every day | ORAL | Status: DC

## 2016-01-26 NOTE — Progress Notes (Signed)
Name: Frederick Nelson   MRN: [id number]    DOB: Sep 07, 1950   Date:01/26/2016       Progress Note  Subjective  Chief Complaint  Chief Complaint  Patient presents with  . Hypertension    medication refill follow up  . Insomnia    med ication refill    Hypertension This is a chronic problem. The problem is unchanged. The problem is controlled. Pertinent negatives include no chest pain, headaches or palpitations. Past treatments include calcium channel blockers and ACE inhibitors.  Insomnia Primary symptoms: sleep disturbance, difficulty falling asleep, frequent awakening.  The problem occurs nightly. The symptoms are aggravated by anxiety and work stress. Past treatments include medication. Typical bedtime:  11-12 P.M..  How long after going to bed to you fall asleep: 30-60 minutes.   PMH includes: hypertension, work related stressors.     Past Medical History  Diagnosis Date  . Hypertension   . Arthritis   . Abdominal pain, right lower quadrant   . Malignant neoplasm of ascending colon ([place]) 2011  . Special screening for malignant neoplasms, colon 2011  . Degenerative joint disease (DJD) of hip     Past Surgical History  Procedure Laterality Date  . Colonoscopy  [id number]  . Colon surgery  06/26/2007    right hemicolectomy-T2N0 carcinoma of right colon. The tumor was 2.6 cm,low grade with zero of 14 nodes positive. No evidence of venous or lymphatic invasion  . Colonoscopy with propofol N/A 08/04/2015    Procedure: COLONOSCOPY WITH PROPOFOL;  Surgeon: [name], MD;  Location: [facility] ENDOSCOPY;  Service: Endoscopy;  Laterality: N/A;    Family History  Problem Relation Age of Onset  . Cancer Brother     cancer of the thymus  . Cancer Maternal Aunt     breast cancer    Social History   Social History  . Marital Status: Divorced    Spouse Name: N/A  . Number of Children: N/A  . Years of Education: N/A   Occupational History  . Not on file.   Social History  Main Topics  . Smoking status: Never Smoker   . Smokeless tobacco: Never Used  . Alcohol Use: 0.6 - 1.2 oz/week    1-2 Standard drinks or equivalent per week     Comment: drinks rarely  . Drug Use: No  . Sexual Activity: Not on file   Other Topics Concern  . Not on file   Social History Narrative     Current outpatient prescriptions:  .  amLODipine (NORVASC) 5 MG tablet, TAKE 1 TABLET BY MOUTH DAILY, Disp: 30 tablet, Rfl: 7 .  CIALIS 20 MG tablet, TAKE 1 TABLET BY MOUTH AS NEEDED, Disp: 90 tablet, Rfl: 0 .  ramipril (ALTACE) 10 MG capsule, Take 1 capsule (10 mg total) by mouth 2 (two) times daily., Disp: 180 capsule, Rfl: 0 .  temazepam (RESTORIL) 30 MG capsule, TAKE ONE CAPSULE BY MOUTH AT BEDTIME, Disp: 30 capsule, Rfl: 0 .  valACYclovir (VALTREX) 1000 MG tablet, TAKE 1 TABLET BY MOUTH EVERY DAY, Disp: 90 tablet, Rfl: 0  Allergies  Allergen Reactions  . Ciprofloxacin     Tendon damage     Review of Systems  Cardiovascular: Negative for chest pain and palpitations.  Neurological: Negative for headaches.  Psychiatric/Behavioral: Positive for sleep disturbance. The patient has insomnia.      Objective  Filed Vitals:   01/26/16 1009  BP: 140/76  Pulse: 60  Temp: 98.1 F (36.7 C)  TempSrc: Oral  Resp: 16  Height: 5\' 11"  (1.803 m)  Weight: 186 lb 4.8 oz (84.505 kg)  SpO2: 98%    Physical Exam  Constitutional: He is oriented to person, place, and time and well-developed, well-nourished, and in no distress.  HENT:  Head: Normocephalic and atraumatic.  Cardiovascular: Normal rate and regular rhythm.   Pulmonary/Chest: Effort normal and breath sounds normal.  Musculoskeletal:       Right ankle: He exhibits swelling.       Left ankle: He exhibits swelling.  1+ pitting edema bilateral LE  Neurological: He is alert and oriented to person, place, and time.  Psychiatric: Mood, memory, affect and judgment normal.  Nursing note and vitals  reviewed.      Assessment & Plan  1. Essential hypertension Pressures stable and at goal, continue present therapy - ramipril (ALTACE) 10 MG capsule; Take 1 capsule (10 mg total) by mouth 2 (two) times daily.  Dispense: 180 capsule; Refill: 0  2. Chronic insomnia Refill for temazepam is provided, continue as directed. - temazepam (RESTORIL) 30 MG capsule; Take 1 capsule (30 mg total) by mouth at bedtime.  Dispense: 30 capsule; Refill: 2  3. Bilateral leg edema Bilateral lower extremity swelling could be a side effect of amlodipine as patient reportedly took double the usual dose of amlodipine last night. We'll obtain CMP for assessment of liver and kidney function  - Comprehensive Metabolic Panel (CMET)   Aryianna Earwood [facility] 01/26/2016 10:23 AM

## 2016-01-27 LAB — COMPREHENSIVE METABOLIC PANEL
ALK PHOS: 71 IU/L (ref 39–117)
ALT: 20 IU/L (ref 0–44)
AST: 23 IU/L (ref 0–40)
Albumin/Globulin Ratio: 1.5 (ref 1.2–2.2)
Albumin: 4.8 g/dL (ref 3.6–4.8)
BUN/Creatinine Ratio: 13 (ref 10–24)
BUN: 19 mg/dL (ref 8–27)
Bilirubin Total: 0.5 mg/dL (ref 0.0–1.2)
CO2: 24 mmol/L (ref 18–29)
CREATININE: 1.49 mg/dL — AB (ref 0.76–1.27)
Calcium: 10 mg/dL (ref 8.6–10.2)
Chloride: 101 mmol/L (ref 96–106)
GFR calc Af Amer: 57 mL/min/{1.73_m2} — ABNORMAL LOW (ref >59)
GFR calc non Af Amer: 49 mL/min/{1.73_m2} — ABNORMAL LOW (ref >59)
GLUCOSE: 91 mg/dL (ref 65–99)
Globulin, Total: 3.2 g/dL (ref 1.5–4.5)
Potassium: 4.6 mmol/L (ref 3.5–5.2)
Sodium: 142 mmol/L (ref 134–144)
Total Protein: 8 g/dL (ref 6.0–8.5)

## 2016-02-08 ENCOUNTER — Other Ambulatory Visit: Payer: Self-pay | Admitting: Family Medicine

## 2016-02-11 NOTE — Telephone Encounter (Signed)
Patient verbally informed prescription has been sent to pharmacy.

## 2016-02-11 NOTE — Telephone Encounter (Signed)
Last seen on 01-26-16 and the amlodipine was not refilled at last visit. Please send refill to [facility]. Only have a couple of days left.

## 2016-03-27 ENCOUNTER — Other Ambulatory Visit: Payer: Self-pay | Admitting: Family Medicine

## 2016-04-03 ENCOUNTER — Telehealth: Payer: Self-pay | Admitting: Family Medicine

## 2016-04-03 NOTE — Telephone Encounter (Signed)
Patient was prescribed temazepam 30 day supply with 2 refills on 01/26/2016. Please schedule for an appointment for medication refill.

## 2016-04-04 NOTE — Telephone Encounter (Signed)
PT NOTIFIED AND SAID THAT HE FOUND OUT HE HAS A REFILL AND WILL KEEP HIS APPT FOR 04-27-16

## 2016-04-22 ENCOUNTER — Other Ambulatory Visit: Payer: Self-pay | Admitting: Family Medicine

## 2016-04-22 DIAGNOSIS — I10 Essential (primary) hypertension: Secondary | ICD-10-CM

## 2016-04-27 ENCOUNTER — Encounter: Payer: Self-pay | Admitting: Family Medicine

## 2016-04-27 ENCOUNTER — Ambulatory Visit
Admission: RE | Admit: 2016-04-27 | Discharge: 2016-04-27 | Disposition: A | Discharge status: Home / Self Care | Payer: Managed Care, Other (non HMO) | Source: Ambulatory Visit | Attending: Family Medicine | Admitting: Family Medicine

## 2016-04-27 ENCOUNTER — Ambulatory Visit (INDEPENDENT_AMBULATORY_CARE_PROVIDER_SITE_OTHER): Payer: Managed Care, Other (non HMO) | Admitting: Family Medicine

## 2016-04-27 VITALS — BP 134/69 | HR 60 | Temp 98.6°F | Resp 17 | Ht 71.0 in | Wt 190.8 lb

## 2016-04-27 DIAGNOSIS — M160 Bilateral primary osteoarthritis of hip: Secondary | ICD-10-CM | POA: Diagnosis not present

## 2016-04-27 DIAGNOSIS — M169 Osteoarthritis of hip, unspecified: Secondary | ICD-10-CM | POA: Insufficient documentation

## 2016-04-27 DIAGNOSIS — F5104 Psychophysiologic insomnia: Secondary | ICD-10-CM

## 2016-04-27 DIAGNOSIS — H538 Other visual disturbances: Secondary | ICD-10-CM | POA: Diagnosis not present

## 2016-04-27 DIAGNOSIS — I10 Essential (primary) hypertension: Secondary | ICD-10-CM

## 2016-04-27 DIAGNOSIS — G4700 Insomnia, unspecified: Secondary | ICD-10-CM

## 2016-04-27 MED ORDER — RAMIPRIL 10 MG PO CAPS: 10.0000 mg | ORAL_CAPSULE | Freq: Two times a day (BID) | ORAL | 1 refills | Status: DC

## 2016-04-27 MED ORDER — AMLODIPINE BESYLATE 5 MG PO TABS: 5.0000 mg | ORAL_TABLET | Freq: Every day | ORAL | 1 refills | Status: DC

## 2016-04-27 MED ORDER — CYCLOBENZAPRINE HCL 5 MG PO TABS: 5.0000 mg | ORAL_TABLET | Freq: Three times a day (TID) | ORAL | 0 refills | Status: DC | PRN

## 2016-04-27 MED ORDER — MELOXICAM 7.5 MG PO TABS: 7.5000 mg | ORAL_TABLET | Freq: Every day | ORAL | 0 refills | Status: DC

## 2016-04-27 NOTE — Progress Notes (Signed)
Name: Frederick Nelson   MRN: [id number]    DOB: 1950/12/07   Date:04/27/2016       Progress Note  Subjective  Chief Complaint  Chief Complaint  Patient presents with  . Follow-up    3 mo  . Medication Refill    Hypertension  This is a chronic problem. The problem is unchanged. The problem is controlled. Associated symptoms include blurred vision (in last 2 weeks, he had two incidents of blurred vision, resolved spontaneously, ) and headaches. Pertinent negatives include no chest pain or palpitations. Past treatments include ACE inhibitors and calcium channel blockers. There is no history of kidney disease, CAD/MI or CVA.   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 He starts getting pain in the right buttock area with any strenuous activity, which then progresses to lower back area. In the past, he has been prescribed Flexeril and Meloxicam to use as needed and he is requesting a refill today.   Past Medical History:  Diagnosis Date  . Abdominal pain, right lower quadrant   . Arthritis   . Degenerative joint disease (DJD) of hip   . Hypertension   . Malignant neoplasm of ascending colon ([place]) 2011  . Special screening for malignant neoplasms, colon 2011    Past Surgical History:  Procedure Laterality Date  . COLON SURGERY  06/26/2007   right hemicolectomy-T2N0 carcinoma of right colon. The tumor was 2.6 cm,low grade with zero of 14 nodes positive. No evidence of venous or lymphatic invasion  . COLONOSCOPY  [id number]  . COLONOSCOPY WITH PROPOFOL N/A 08/04/2015   Procedure: COLONOSCOPY WITH PROPOFOL;  Surgeon: [name], MD;  Location: [facility] ENDOSCOPY;  Service: Endoscopy;  Laterality: N/A;    Family History  Problem Relation Age of Onset  . Cancer Brother     cancer of the thymus  . Cancer Maternal Aunt     breast cancer    Social History   Social History  . Marital status: Divorced     Spouse name: N/A  . Number of children: N/A  . Years of education: N/A   Occupational History  . Not on file.   Social History Main Topics  . Smoking status: Never Smoker  . Smokeless tobacco: Never Used  . Alcohol use 0.6 - 1.2 oz/week    1 - 2 Standard drinks or equivalent per week     Comment: drinks rarely  . Drug use: No  . Sexual activity: Not on file   Other Topics Concern  . Not on file   Social History Narrative  . No narrative on file     Current Outpatient Prescriptions:  .  amLODipine (NORVASC) 5 MG tablet, TAKE 1 TABLET BY MOUTH DAILY, Disp: 30 tablet, Rfl: 7 .  CIALIS 20 MG tablet, TAKE 1 TABLET BY MOUTH AS NEEDED, Disp: 90 tablet, Rfl: 0 .  ramipril (ALTACE) 10 MG capsule, Take 1 capsule (10 mg total) by mouth 2 (two) times daily., Disp: 180 capsule, Rfl: 0 .  temazepam (RESTORIL) 30 MG capsule, Take 1 capsule (30 mg total) by mouth at bedtime., Disp: 30 capsule, Rfl: 2 .  valACYclovir (VALTREX) 1000 MG tablet, TAKE 1 TABLET BY MOUTH EVERY DAY, Disp: 90 tablet, Rfl: 0  Allergies  Allergen Reactions  . Ciprofloxacin     Tendon damage    Review of Systems  Eyes: Positive for blurred vision (in last 2 weeks,  he had two incidents of blurred vision, resolved spontaneously, ).  Cardiovascular: Negative for chest pain, palpitations and leg swelling.  Neurological: Positive for headaches.    Objective  Vitals:   04/27/16 0939  BP: 134/69  Pulse: 60  Resp: 17  Temp: 98.6 F (37 C)  TempSrc: Oral  SpO2: 98%  Weight: 190 lb 12.8 oz (86.5 kg)  Height: 5\' 11"  (1.803 m)    Physical Exam  Constitutional: He is oriented to person, place, and time and well-developed, well-nourished, and in no distress.  Eyes: Conjunctivae are normal. Pupils are equal, round, and reactive to light.  Cardiovascular: Normal rate, regular rhythm, S1 normal and S2 normal.   No murmur heard. Pulmonary/Chest: Breath sounds normal. He has no wheezes. He has no rhonchi.   Abdominal: Soft. There is no tenderness.  Musculoskeletal:       Right hip: Normal. He exhibits normal strength, no tenderness and no deformity.       Left hip: Normal. He exhibits no tenderness and no swelling.       Right ankle: He exhibits no swelling.       Left ankle: He exhibits no swelling.       Lumbar back: Normal. He exhibits no tenderness, no pain and no spasm.  Neurological: He is alert and oriented to person, place, and time.  Psychiatric: Mood, memory, affect and judgment normal.  Nursing note and vitals reviewed.   Assessment & Plan  1. Essential hypertension  - ramipril (ALTACE) 10 MG capsule; Take 1 capsule (10 mg total) by mouth 2 (two) times daily.  Dispense: 180 capsule; Refill: 1 - amLODipine (NORVASC) 5 MG tablet; Take 1 tablet (5 mg total) by mouth daily.  Dispense: 90 tablet; Refill: 1  2. Blurred vision, bilateral Recommended that he follow up with an ophthalmologist for a detailed eye exam  3. Primary osteoarthritis of both hips No recent x-rays available, we will obtain an updated x-rays of both hips, prescription for meloxicam and cyclobenzaprine provided for use on an as-needed basis - cyclobenzaprine (FLEXERIL) 5 MG tablet; Take 1 tablet (5 mg total) by mouth 3 (three) times daily as needed for muscle spasms.  Dispense: 90 tablet; Refill: 0 - meloxicam (MOBIC) 7.5 MG tablet; Take 1 tablet (7.5 mg total) by mouth daily.  Dispense: 30 tablet; Refill: 0 - DG HIPS BILAT W OR W/O PELVIS 3-4 VIEWS; Future  4. Chronic insomnia Patient wants to discontinue taking temazepam and he will make an appointment to talk about tapering off and replacing with a different sleep agent.   Sharvil Hoey [facility] 04/27/2016 10:01 AM

## 2016-05-04 ENCOUNTER — Other Ambulatory Visit: Payer: Self-pay | Admitting: Family Medicine

## 2016-05-04 DIAGNOSIS — F5104 Psychophysiologic insomnia: Secondary | ICD-10-CM

## 2016-05-11 ENCOUNTER — Ambulatory Visit (INDEPENDENT_AMBULATORY_CARE_PROVIDER_SITE_OTHER): Payer: Managed Care, Other (non HMO) | Admitting: Family Medicine

## 2016-05-11 ENCOUNTER — Encounter: Payer: Self-pay | Admitting: Family Medicine

## 2016-05-11 VITALS — BP 131/63 | HR 65 | Temp 98.2°F | Resp 15 | Ht 71.0 in | Wt 196.5 lb

## 2016-05-11 DIAGNOSIS — Z23 Encounter for immunization: Secondary | ICD-10-CM

## 2016-05-11 DIAGNOSIS — Z0000 Encounter for general adult medical examination without abnormal findings: Secondary | ICD-10-CM | POA: Diagnosis not present

## 2016-05-11 LAB — LIPID PANEL
CHOL/HDL RATIO: 2.7 ratio (ref <=5.0)
Cholesterol: 178 mg/dL (ref 125–200)
HDL: 65 mg/dL (ref >=40)
LDL CALC: 93 mg/dL (ref <130)
TRIGLYCERIDES: 99 mg/dL (ref <150)
VLDL: 20 mg/dL (ref <30)

## 2016-05-11 LAB — COMPREHENSIVE METABOLIC PANEL
ALT: 19 U/L (ref 9–46)
AST: 18 U/L (ref 10–35)
Albumin: 4.3 g/dL (ref 3.6–5.1)
Alkaline Phosphatase: 58 U/L (ref 40–115)
BUN: 21 mg/dL (ref 7–25)
CHLORIDE: 108 mmol/L (ref 98–110)
CO2: 25 mmol/L (ref 20–31)
CREATININE: 1.55 mg/dL — AB (ref 0.70–1.25)
Calcium: 9.4 mg/dL (ref 8.6–10.3)
Glucose, Bld: 79 mg/dL (ref 65–99)
POTASSIUM: 4.8 mmol/L (ref 3.5–5.3)
SODIUM: 144 mmol/L (ref 135–146)
TOTAL PROTEIN: 6.9 g/dL (ref 6.1–8.1)
Total Bilirubin: 0.4 mg/dL (ref 0.2–1.2)

## 2016-05-11 LAB — TSH: TSH: 1.68 m[IU]/L (ref 0.40–4.50)

## 2016-05-11 LAB — PSA: PSA: 1 ng/mL (ref <=4.0)

## 2016-05-11 NOTE — Progress Notes (Signed)
Name: Frederick Nelson   MRN: [id number]    DOB: [birth year]   Date:05/11/2016       Progress Note  Subjective  Chief Complaint  Chief Complaint  Patient presents with  . Annual Exam    CPE    HPI  Pt. Presents for a Complete Physical Exam. He is doing well Has history of colon cancer and last colonoscopy in December 2016, repeat in 2021. PSA done in October 2016 was 1.1, no personal history of prostate cancer.  Past Medical History:  Diagnosis Date  . Abdominal pain, right lower quadrant   . Arthritis   . Degenerative joint disease (DJD) of hip   . Hypertension   . Malignant neoplasm of ascending colon ([place]) 2011  . Special screening for malignant neoplasms, colon 2011    Past Surgical History:  Procedure Laterality Date  . COLON SURGERY  06/26/2007   right hemicolectomy-T2N0 carcinoma of right colon. The tumor was 2.6 cm,low grade with zero of 14 nodes positive. No evidence of venous or lymphatic invasion  . COLONOSCOPY  [id number]  . COLONOSCOPY WITH PROPOFOL N/A 08/04/2015   Procedure: COLONOSCOPY WITH PROPOFOL;  Surgeon: [name], MD;  Location: [facility] ENDOSCOPY;  Service: Endoscopy;  Laterality: N/A;    Family History  Problem Relation Age of Onset  . Cancer Brother     cancer of the thymus  . Cancer Maternal Aunt     breast cancer    Social History   Social History  . Marital status: Divorced    Spouse name: N/A  . Number of children: N/A  . Years of education: N/A   Occupational History  . Not on file.   Social History Main Topics  . Smoking status: Never Smoker  . Smokeless tobacco: Never Used  . Alcohol use 0.6 - 1.2 oz/week    1 - 2 Standard drinks or equivalent per week     Comment: drinks rarely  . Drug use: No  . Sexual activity: Not on file   Other Topics Concern  . Not on file   Social History Narrative  . No narrative on file     Current Outpatient Prescriptions:  .  amLODipine (NORVASC) 5 MG tablet, Take 1 tablet (5 mg  total) by mouth daily., Disp: 90 tablet, Rfl: 1 .  CIALIS 20 MG tablet, TAKE 1 TABLET BY MOUTH AS NEEDED, Disp: 90 tablet, Rfl: 0 .  cyclobenzaprine (FLEXERIL) 5 MG tablet, Take 1 tablet (5 mg total) by mouth 3 (three) times daily as needed for muscle spasms., Disp: 90 tablet, Rfl: 0 .  meloxicam (MOBIC) 7.5 MG tablet, Take 1 tablet (7.5 mg total) by mouth daily., Disp: 30 tablet, Rfl: 0 .  ramipril (ALTACE) 10 MG capsule, Take 1 capsule (10 mg total) by mouth 2 (two) times daily., Disp: 180 capsule, Rfl: 1 .  temazepam (RESTORIL) 30 MG capsule, TAKE ONE CAPSULE BY MOUTH AT BEDTIME, Disp: 30 capsule, Rfl: 2 .  valACYclovir (VALTREX) 1000 MG tablet, TAKE 1 TABLET BY MOUTH EVERY DAY, Disp: 90 tablet, Rfl: 0  Allergies  Allergen Reactions  . Ciprofloxacin     Tendon damage     Review of Systems  Constitutional: Negative for chills, diaphoresis, fever and malaise/fatigue.  HENT: Negative for sore throat.   Eyes: Negative for blurred vision and double vision.  Respiratory: Negative for cough and shortness of breath.   Cardiovascular: Negative for chest pain, palpitations and leg swelling.  Gastrointestinal: Positive for heartburn (intermittent heartburn  with chocolate, tomatoes etc.). Negative for blood in stool, nausea and vomiting.  Genitourinary: Negative for dysuria and hematuria.  Musculoskeletal: Negative for back pain, joint pain and myalgias.  Neurological: Negative for dizziness and headaches.  Psychiatric/Behavioral: Negative for depression. The patient is not nervous/anxious.     Objective  Vitals:   05/11/16 0916  BP: 131/63  Pulse: 65  Resp: 15  Temp: 98.2 F (36.8 C)  TempSrc: Oral  SpO2: 98%  Weight: 196 lb 8 oz (89.1 kg)  Height: 5\' 11"  (1.803 m)    Physical Exam  Constitutional: He is oriented to person, place, and time and well-developed, well-nourished, and in no distress.  HENT:  Head: Normocephalic and atraumatic.  Right Ear: External ear normal.  Neck:  Normal range of motion. Neck supple. No thyromegaly present.  Cardiovascular: Normal rate, regular rhythm and normal heart sounds.   No murmur heard. Pulmonary/Chest: Effort normal and breath sounds normal. He has no wheezes.  Abdominal: Soft. Bowel sounds are normal. There is no tenderness.  Genitourinary: Rectum normal and prostate normal. Prostate is not enlarged and not tender.  Musculoskeletal: He exhibits no edema.  Neurological: He is alert and oriented to person, place, and time.  Skin: Skin is warm and dry.  Psychiatric: Mood, memory, affect and judgment normal.  Nursing note and vitals reviewed.     Assessment & Plan  1. Annual physical exam Obtain age appropriate laboratory screenings, patient up-to-date on surveillance colonoscopy.  - Lipid Profile - Comprehensive Metabolic Panel (CMET) - Vitamin D (25 hydroxy) - TSH - PSA  2. Need for immunization against influenza  - Flu Vaccine QUAD 36+ mos PF IM (Fluarix & Fluzone Quad PF)   Jerek Meulemans [facility] 05/11/2016 9:55 AM

## 2016-05-12 ENCOUNTER — Other Ambulatory Visit: Payer: Self-pay | Admitting: Family Medicine

## 2016-05-12 LAB — LIPID PANEL
CHOL/HDL RATIO: 2.6 ratio (ref <=5.0)
Cholesterol: 162 mg/dL (ref 125–200)
HDL: 63 mg/dL (ref >=40)
LDL CALC: 80 mg/dL (ref <130)
TRIGLYCERIDES: 95 mg/dL (ref <150)
VLDL: 19 mg/dL (ref <30)

## 2016-05-12 LAB — VITAMIN D 25 HYDROXY (VIT D DEFICIENCY, FRACTURES): VIT D 25 HYDROXY: 24 ng/mL — AB (ref 30–100)

## 2016-05-17 ENCOUNTER — Telehealth: Payer: Self-pay

## 2016-05-17 MED ORDER — VITAMIN D (ERGOCALCIFEROL) 1.25 MG (50000 UNIT) PO CAPS: 50000.0000 [IU] | ORAL_CAPSULE | ORAL | 0 refills | Status: DC

## 2016-05-17 NOTE — Telephone Encounter (Signed)
Patient has been notified of lab results and a prescription of Vitamin D3 50,000 units 1 capsule once a week for 12 weeks has been sent to [facility] per Dr. Manuella Ghazi, patient has been notified and verbalized understanding.

## 2016-05-23 ENCOUNTER — Other Ambulatory Visit: Payer: Self-pay | Admitting: Family Medicine

## 2016-05-23 DIAGNOSIS — M160 Bilateral primary osteoarthritis of hip: Secondary | ICD-10-CM

## 2016-06-19 ENCOUNTER — Encounter: Payer: Managed Care, Other (non HMO) | Admitting: Family Medicine

## 2016-06-20 ENCOUNTER — Other Ambulatory Visit: Payer: Self-pay | Admitting: Family Medicine

## 2016-06-20 DIAGNOSIS — M160 Bilateral primary osteoarthritis of hip: Secondary | ICD-10-CM

## 2016-06-21 ENCOUNTER — Other Ambulatory Visit: Payer: Self-pay | Admitting: Family Medicine

## 2016-06-21 DIAGNOSIS — M160 Bilateral primary osteoarthritis of hip: Secondary | ICD-10-CM

## 2016-07-03 ENCOUNTER — Other Ambulatory Visit: Payer: Self-pay | Admitting: Family Medicine

## 2016-08-06 ENCOUNTER — Other Ambulatory Visit: Payer: Self-pay | Admitting: Family Medicine

## 2016-08-07 ENCOUNTER — Other Ambulatory Visit: Payer: Self-pay | Admitting: Family Medicine

## 2016-08-07 DIAGNOSIS — F5104 Psychophysiologic insomnia: Secondary | ICD-10-CM

## 2016-08-09 ENCOUNTER — Encounter: Payer: Self-pay | Admitting: Family Medicine

## 2016-08-09 ENCOUNTER — Ambulatory Visit (INDEPENDENT_AMBULATORY_CARE_PROVIDER_SITE_OTHER): Payer: Managed Care, Other (non HMO) | Admitting: Family Medicine

## 2016-08-09 VITALS — BP 138/78 | HR 68 | Temp 97.9°F | Resp 16 | Ht 71.0 in | Wt 195.6 lb

## 2016-08-09 DIAGNOSIS — I10 Essential (primary) hypertension: Secondary | ICD-10-CM | POA: Diagnosis not present

## 2016-08-09 DIAGNOSIS — F5104 Psychophysiologic insomnia: Secondary | ICD-10-CM | POA: Diagnosis not present

## 2016-08-09 DIAGNOSIS — N529 Male erectile dysfunction, unspecified: Secondary | ICD-10-CM | POA: Insufficient documentation

## 2016-08-09 DIAGNOSIS — E559 Vitamin D deficiency, unspecified: Secondary | ICD-10-CM

## 2016-08-09 MED ORDER — TEMAZEPAM 30 MG PO CAPS: 30.0000 mg | ORAL_CAPSULE | Freq: Every day | ORAL | 2 refills | Status: DC

## 2016-08-09 MED ORDER — TADALAFIL 20 MG PO TABS: 20.0000 mg | ORAL_TABLET | ORAL | 1 refills | Status: DC | PRN

## 2016-08-09 NOTE — Progress Notes (Signed)
Name: Frederick Nelson   MRN: [id number]    DOB: [birth year]   Date:08/09/2016       Progress Note  Subjective  Chief Complaint  Chief Complaint  Patient presents with  . Hypertension  . Follow-up    medication refills    Hypertension  This is a chronic problem. The problem is unchanged. The problem is controlled. Pertinent negatives include no blurred vision, chest pain, headaches, orthopnea, palpitations or shortness of breath. Past treatments include ACE inhibitors and calcium channel blockers. There is no history of kidney disease, CAD/MI or CVA.  Insomnia  Primary symptoms: sleep disturbance, frequent awakening.  Past treatments include medication. Typical bedtime:  11-12 P.M..  How long after going to bed to you fall asleep: 15-30 minutes.   PMH includes: no hypertension, no depression, no family stress or anxiety, no chronic pain.      Past Medical History:  Diagnosis Date  . Abdominal pain, right lower quadrant   . Arthritis   . Degenerative joint disease (DJD) of hip   . Hypertension   . Malignant neoplasm of ascending colon ([place]) 2011  . Special screening for malignant neoplasms, colon 2011    Past Surgical History:  Procedure Laterality Date  . COLON SURGERY  06/26/2007   right hemicolectomy-T2N0 carcinoma of right colon. The tumor was 2.6 cm,low grade with zero of 14 nodes positive. No evidence of venous or lymphatic invasion  . COLONOSCOPY  [id number]  . COLONOSCOPY WITH PROPOFOL N/A 08/04/2015   Procedure: COLONOSCOPY WITH PROPOFOL;  Surgeon: [name], MD;  Location: [facility] ENDOSCOPY;  Service: Endoscopy;  Laterality: N/A;    Family History  Problem Relation Age of Onset  . Cancer Brother     cancer of the thymus  . Cancer Maternal Aunt     breast cancer    Social History   Social History  . Marital status: Divorced    Spouse name: N/A  . Number of children: N/A  . Years of education: N/A   Occupational History  . Not on file.   Social  History Main Topics  . Smoking status: Never Smoker  . Smokeless tobacco: Never Used  . Alcohol use 0.6 - 1.2 oz/week    1 - 2 Standard drinks or equivalent per week     Comment: drinks rarely  . Drug use: No  . Sexual activity: Not on file   Other Topics Concern  . Not on file   Social History Narrative  . No narrative on file     Current Outpatient Prescriptions:  .  amLODipine (NORVASC) 5 MG tablet, Take 1 tablet (5 mg total) by mouth daily., Disp: 90 tablet, Rfl: 1 .  CIALIS 20 MG tablet, TAKE 1 TABLET BY MOUTH AS NEEDED, Disp: 90 tablet, Rfl: 0 .  cyclobenzaprine (FLEXERIL) 5 MG tablet, TAKE 1 TABLET (5 MG TOTAL) BY MOUTH 3 (THREE) TIMES DAILY AS NEEDED FOR MUSCLE SPASMS., Disp: 90 tablet, Rfl: 0 .  meloxicam (MOBIC) 7.5 MG tablet, TAKE 1 TABLET (7.5 MG TOTAL) BY MOUTH DAILY., Disp: 30 tablet, Rfl: 0 .  ramipril (ALTACE) 10 MG capsule, Take 1 capsule (10 mg total) by mouth 2 (two) times daily., Disp: 180 capsule, Rfl: 1 .  temazepam (RESTORIL) 30 MG capsule, TAKE ONE CAPSULE BY MOUTH AT BEDTIME, Disp: 30 capsule, Rfl: 2 .  valACYclovir (VALTREX) 1000 MG tablet, TAKE 1 TABLET BY MOUTH EVERY DAY, Disp: 90 tablet, Rfl: 0 .  Vitamin D, Ergocalciferol, (DRISDOL) 50000 units CAPS  capsule, Take 1 capsule (50,000 Units total) by mouth once a week. For 12 weeks, Disp: 12 capsule, Rfl: 0  Allergies  Allergen Reactions  . Ciprofloxacin     Tendon damage     Review of Systems  Eyes: Negative for blurred vision.  Respiratory: Negative for shortness of breath.   Cardiovascular: Negative for chest pain, palpitations and orthopnea.  Neurological: Negative for headaches.  Psychiatric/Behavioral: Positive for sleep disturbance. Negative for depression. The patient has insomnia.      Objective  Vitals:   08/09/16 1104  BP: 138/78  Pulse: 68  Resp: 16  Temp: 97.9 F (36.6 C)  TempSrc: Oral  Weight: 195 lb 9.6 oz (88.7 kg)  Height: 5\' 11"  (1.803 m)    Physical Exam   Constitutional: He is oriented to person, place, and time and well-developed, well-nourished, and in no distress.  HENT:  Head: Normocephalic and atraumatic.  Cardiovascular: Normal rate, regular rhythm and normal heart sounds.   No murmur heard. Pulmonary/Chest: Effort normal and breath sounds normal. He has no wheezes.  Abdominal: Soft. Bowel sounds are normal. There is no tenderness.  Neurological: He is alert and oriented to person, place, and time.  Psychiatric: Mood, memory, affect and judgment normal.  Nursing note and vitals reviewed.     Assessment & Plan  1. Chronic insomnia  - temazepam (RESTORIL) 30 MG capsule; Take 1 capsule (30 mg total) by mouth at bedtime.  Dispense: 30 capsule; Refill: 2  2. Essential hypertension BP stable and controlled on present antihypertensive therapy  3. Erectile dysfunction, unspecified erectile dysfunction type  - tadalafil (CIALIS) 20 MG tablet; Take 1 tablet (20 mg total) by mouth as needed.  Dispense: 6 tablet; Refill: 1  4. Vitamin D deficiency  - Vitamin D (25 hydroxy)   Saraann Enneking [facility] 08/09/2016 11:05 AM

## 2016-08-10 LAB — VITAMIN D 25 HYDROXY (VIT D DEFICIENCY, FRACTURES): Vit D, 25-Hydroxy: 28 ng/mL — ABNORMAL LOW (ref 30–100)

## 2016-08-17 ENCOUNTER — Ambulatory Visit: Payer: Managed Care, Other (non HMO) | Admitting: Family Medicine

## 2016-08-31 ENCOUNTER — Ambulatory Visit: Payer: Managed Care, Other (non HMO) | Admitting: Family Medicine

## 2016-10-03 ENCOUNTER — Other Ambulatory Visit: Payer: Self-pay | Admitting: Family Medicine

## 2016-10-09 ENCOUNTER — Other Ambulatory Visit: Payer: Self-pay | Admitting: Family Medicine

## 2016-10-17 ENCOUNTER — Other Ambulatory Visit: Payer: Self-pay | Admitting: Family Medicine

## 2016-10-17 DIAGNOSIS — I10 Essential (primary) hypertension: Secondary | ICD-10-CM

## 2016-10-20 ENCOUNTER — Telehealth: Payer: Self-pay | Admitting: Family Medicine

## 2016-10-20 NOTE — Telephone Encounter (Signed)
Pt checked with pharmacy and they did not have his valacyclovir. He is asking that you please resend to [facility]

## 2016-10-23 ENCOUNTER — Other Ambulatory Visit: Payer: Self-pay | Admitting: Emergency Medicine

## 2016-10-23 MED ORDER — VALACYCLOVIR HCL 1 G PO TABS: 1000.0000 mg | ORAL_TABLET | Freq: Every day | ORAL | 0 refills | Status: DC

## 2016-10-23 NOTE — Telephone Encounter (Signed)
Script sent to pharmacy.

## 2016-12-05 ENCOUNTER — Other Ambulatory Visit: Payer: Self-pay | Admitting: Family Medicine

## 2016-12-05 DIAGNOSIS — N529 Male erectile dysfunction, unspecified: Secondary | ICD-10-CM

## 2016-12-08 ENCOUNTER — Ambulatory Visit (INDEPENDENT_AMBULATORY_CARE_PROVIDER_SITE_OTHER): Payer: Medicare Other | Admitting: Family Medicine

## 2016-12-08 ENCOUNTER — Encounter: Payer: Self-pay | Admitting: Family Medicine

## 2016-12-08 VITALS — BP 136/75 | HR 62 | Temp 97.6°F | Resp 16 | Ht 71.0 in | Wt 192.2 lb

## 2016-12-08 DIAGNOSIS — N529 Male erectile dysfunction, unspecified: Secondary | ICD-10-CM

## 2016-12-08 DIAGNOSIS — I10 Essential (primary) hypertension: Secondary | ICD-10-CM

## 2016-12-08 DIAGNOSIS — F5104 Psychophysiologic insomnia: Secondary | ICD-10-CM

## 2016-12-08 MED ORDER — AMLODIPINE BESYLATE 5 MG PO TABS: 5.0000 mg | ORAL_TABLET | Freq: Every day | ORAL | 1 refills | Status: DC

## 2016-12-08 MED ORDER — TADALAFIL 20 MG PO TABS: 20.0000 mg | ORAL_TABLET | ORAL | 1 refills | Status: DC | PRN

## 2016-12-08 MED ORDER — TRAZODONE HCL 50 MG PO TABS: 25.0000 mg | ORAL_TABLET | Freq: Every evening | ORAL | 2 refills | Status: DC | PRN

## 2016-12-08 NOTE — Progress Notes (Signed)
Name: Frederick Nelson   MRN: [id number]    DOB: [birth year]   Date:12/08/2016       Progress Note  Subjective  Chief Complaint  Chief Complaint  Patient presents with  . Follow-up    3 MO  . Medication Refill    Hypertension  This is a chronic problem. The problem is unchanged. The problem is controlled. Pertinent negatives include no blurred vision (occasional blurred vision.), chest pain, headaches, orthopnea or palpitations. Past treatments include ACE inhibitors and calcium channel blockers. There is no history of kidney disease, CAD/MI or CVA.  Erectile Dysfunction  This is a chronic problem. The nature of his difficulty is achieving erection and maintaining erection. Obstructive symptoms include incomplete emptying. Obstructive symptoms do not include dribbling, a slower stream, straining or a weak stream. Pertinent negatives include no hematuria or hesitancy. Past treatments include tadalafil.  Insomnia  Primary symptoms: no sleep disturbance, difficulty falling asleep, frequent awakening.  The onset quality is gradual. Past treatments include medication. The treatment provided significant (had short term memory impairment with Restoril) relief. PMH includes: hypertension.     Past Medical History:  Diagnosis Date  . Abdominal pain, right lower quadrant   . Arthritis   . Degenerative joint disease (DJD) of hip   . Hypertension   . Malignant neoplasm of ascending colon ([place]) 2011  . Special screening for malignant neoplasms, colon 2011    Past Surgical History:  Procedure Laterality Date  . COLON SURGERY  06/26/2007   right hemicolectomy-T2N0 carcinoma of right colon. The tumor was 2.6 cm,low grade with zero of 14 nodes positive. No evidence of venous or lymphatic invasion  . COLONOSCOPY  [id number]  . COLONOSCOPY WITH PROPOFOL N/A 08/04/2015   Procedure: COLONOSCOPY WITH PROPOFOL;  Surgeon: [name], MD;  Location: [facility] ENDOSCOPY;  Service: Endoscopy;  Laterality:  N/A;    Family History  Problem Relation Age of Onset  . Cancer Brother     cancer of the thymus  . Cancer Maternal Aunt     breast cancer    Social History   Social History  . Marital status: Divorced    Spouse name: N/A  . Number of children: N/A  . Years of education: N/A   Occupational History  . Not on file.   Social History Main Topics  . Smoking status: Never Smoker  . Smokeless tobacco: Never Used  . Alcohol use 0.6 - 1.2 oz/week    1 - 2 Standard drinks or equivalent per week     Comment: drinks rarely  . Drug use: No  . Sexual activity: Not on file   Other Topics Concern  . Not on file   Social History Narrative  . No narrative on file     Current Outpatient Prescriptions:  .  amLODipine (NORVASC) 5 MG tablet, Take 1 tablet (5 mg total) by mouth daily., Disp: 90 tablet, Rfl: 1 .  cyclobenzaprine (FLEXERIL) 5 MG tablet, TAKE 1 TABLET (5 MG TOTAL) BY MOUTH 3 (THREE) TIMES DAILY AS NEEDED FOR MUSCLE SPASMS., Disp: 90 tablet, Rfl: 0 .  meloxicam (MOBIC) 7.5 MG tablet, TAKE 1 TABLET (7.5 MG TOTAL) BY MOUTH DAILY., Disp: 30 tablet, Rfl: 0 .  ramipril (ALTACE) 10 MG capsule, TAKE 1 CAPSULE (10 MG TOTAL) BY MOUTH 2 (TWO) TIMES DAILY., Disp: 180 capsule, Rfl: 1 .  tadalafil (CIALIS) 20 MG tablet, Take 1 tablet (20 mg total) by mouth as needed., Disp: 6 tablet, Rfl: 1 .  temazepam (  RESTORIL) 30 MG capsule, Take 1 capsule (30 mg total) by mouth at bedtime., Disp: 30 capsule, Rfl: 2 .  valACYclovir (VALTREX) 1000 MG tablet, Take 1 tablet (1,000 mg total) by mouth daily., Disp: 90 tablet, Rfl: 0 .  Vitamin D, Ergocalciferol, (DRISDOL) 50000 units CAPS capsule, Take 1 capsule (50,000 Units total) by mouth once a week. For 12 weeks (Patient not taking: Reported on 08/09/2016), Disp: 12 capsule, Rfl: 0  Allergies  Allergen Reactions  . Ciprofloxacin     Tendon damage     Review of Systems  Eyes: Negative for blurred vision (occasional blurred vision.).   Cardiovascular: Negative for chest pain, palpitations and orthopnea.  Genitourinary: Positive for incomplete emptying. Negative for hematuria and hesitancy.  Neurological: Negative for headaches.  Psychiatric/Behavioral: Negative for sleep disturbance. The patient has insomnia.      Objective  Vitals:   12/08/16 0835  BP: 136/75  Pulse: 62  Resp: 16  Temp: 97.6 F (36.4 C)  TempSrc: Oral  SpO2: 97%  Weight: 192 lb 3.2 oz (87.2 kg)  Height: 5\' 11"  (1.803 m)    Physical Exam  Constitutional: He is oriented to person, place, and time and well-developed, well-nourished, and in no distress.  HENT:  Head: Normocephalic and atraumatic.  Cardiovascular: Normal rate, regular rhythm and normal heart sounds.   No murmur heard. Pulmonary/Chest: Effort normal and breath sounds normal. He has no wheezes.  Abdominal: Soft. Bowel sounds are normal. There is no tenderness.  Musculoskeletal: He exhibits edema.  Neurological: He is alert and oriented to person, place, and time.  Psychiatric: Mood, memory, affect and judgment normal.  Nursing note and vitals reviewed.    Assessment & Plan  1. Erectile dysfunction, unspecified erectile dysfunction type  - tadalafil (CIALIS) 20 MG tablet; Take 1 tablet (20 mg total) by mouth as needed.  Dispense: 6 tablet; Refill: 1  2. Chronic insomnia DC Restoril over concerns of memory impairment, started on trazodone 50 mg, prescription sent to pharmacy - traZODone (DESYREL) 50 MG tablet; Take 0.5-1 tablets (25-50 mg total) by mouth at bedtime as needed for sleep.  Dispense: 30 tablet; Refill: 2  3. Essential hypertension BP stable on present antihypertensive therapy, obtain FLP - Lipid panel - COMPLETE METABOLIC PANEL WITH GFR - amLODipine (NORVASC) 5 MG tablet; Take 1 tablet (5 mg total) by mouth daily.  Dispense: 90 tablet; Refill: 1   Kersten Salmons [facility] 12/08/2016 8:45 AM

## 2017-02-20 ENCOUNTER — Other Ambulatory Visit: Payer: Self-pay | Admitting: Family Medicine

## 2017-02-20 DIAGNOSIS — N529 Male erectile dysfunction, unspecified: Secondary | ICD-10-CM

## 2017-02-22 ENCOUNTER — Other Ambulatory Visit: Payer: Self-pay | Admitting: Family Medicine

## 2017-02-23 NOTE — Telephone Encounter (Signed)
Pt needs refill on [facility] [place]. Pt is out and he is going out of town tomorrow.

## 2017-03-07 ENCOUNTER — Ambulatory Visit (INDEPENDENT_AMBULATORY_CARE_PROVIDER_SITE_OTHER): Payer: Medicare Other | Admitting: Family Medicine

## 2017-03-07 ENCOUNTER — Encounter: Payer: Self-pay | Admitting: Family Medicine

## 2017-03-07 VITALS — BP 134/75 | HR 60 | Temp 98.3°F | Resp 16 | Ht 71.0 in | Wt 195.9 lb

## 2017-03-07 DIAGNOSIS — W57XXXA Bitten or stung by nonvenomous insect and other nonvenomous arthropods, initial encounter: Secondary | ICD-10-CM | POA: Diagnosis not present

## 2017-03-07 DIAGNOSIS — I10 Essential (primary) hypertension: Secondary | ICD-10-CM | POA: Diagnosis not present

## 2017-03-07 DIAGNOSIS — S80861A Insect bite (nonvenomous), right lower leg, initial encounter: Secondary | ICD-10-CM | POA: Diagnosis not present

## 2017-03-07 LAB — CBC WITH DIFFERENTIAL/PLATELET
BASOS ABS: 52 {cells}/uL (ref 0–200)
Basophils Relative: 1 %
EOS PCT: 3 %
Eosinophils Absolute: 156 cells/uL (ref 15–500)
HEMATOCRIT: 46.5 % (ref 38.5–50.0)
HEMOGLOBIN: 15.4 g/dL (ref 13.2–17.1)
LYMPHS ABS: 2548 {cells}/uL (ref 850–3900)
Lymphocytes Relative: 49 %
MCH: 31.4 pg (ref 27.0–33.0)
MCHC: 33.1 g/dL (ref 32.0–36.0)
MCV: 94.9 fL (ref 80.0–100.0)
MONO ABS: 364 {cells}/uL (ref 200–950)
MPV: 10.4 fL (ref 7.5–12.5)
Monocytes Relative: 7 %
NEUTROS ABS: 2080 {cells}/uL (ref 1500–7800)
NEUTROS PCT: 40 %
Platelets: 176 10*3/uL (ref 140–400)
RBC: 4.9 MIL/uL (ref 4.20–5.80)
RDW: 13.7 % (ref 11.0–15.0)
WBC: 5.2 10*3/uL (ref 3.8–10.8)

## 2017-03-07 MED ORDER — DOXYCYCLINE HYCLATE 100 MG PO TABS: 100.0000 mg | ORAL_TABLET | Freq: Two times a day (BID) | ORAL | 0 refills | Status: DC

## 2017-03-07 NOTE — Progress Notes (Signed)
Name: Frederick Nelson   MRN: [id number]    DOB: Jan 01, 1951   Date:03/07/2017       Progress Note  Subjective  Chief Complaint  Chief Complaint  Patient presents with  . Tick Removal    x3 weeks    Hypertension  This is a chronic problem. The problem is unchanged. The problem is controlled. Associated symptoms include peripheral edema (stopped taking Amlodipine due to peripheral edema). Pertinent negatives include no blurred vision, chest pain, headaches, orthopnea, palpitations or shortness of breath. Past treatments include ACE inhibitors.    Tick Bite: Patient presents for evaluation of a confirmed tick bite, noticed 3 weeks ago on his right posterior ankle. He tried to remove it and may have left its head in his skin. The following day, he tried to remove the head of the tick by applying hand sanitizer on it. He was able to successfully pull off the head of the tick. Since then, he has not felt well, has been feeling intermittent pain in his hips, right ankle and back, has felt light sensitive, and chills, no fever.  He has no history of tick borne illnesses  Past Medical History:  Diagnosis Date  . Abdominal pain, right lower quadrant   . Arthritis   . Degenerative joint disease (DJD) of hip   . Hypertension   . Malignant neoplasm of ascending colon ([place]) 2011  . Special screening for malignant neoplasms, colon 2011    Past Surgical History:  Procedure Laterality Date  . COLON SURGERY  06/26/2007   right hemicolectomy-T2N0 carcinoma of right colon. The tumor was 2.6 cm,low grade with zero of 14 nodes positive. No evidence of venous or lymphatic invasion  . COLONOSCOPY  [id number]  . COLONOSCOPY WITH PROPOFOL N/A 08/04/2015   Procedure: COLONOSCOPY WITH PROPOFOL;  Surgeon: [name], MD;  Location: [facility] ENDOSCOPY;  Service: Endoscopy;  Laterality: N/A;    Family History  Problem Relation Age of Onset  . Cancer Brother        cancer of the thymus  . Cancer Maternal  Aunt        breast cancer    Social History   Social History  . Marital status: Divorced    Spouse name: N/A  . Number of children: N/A  . Years of education: N/A   Occupational History  . Not on file.   Social History Main Topics  . Smoking status: Never Smoker  . Smokeless tobacco: Never Used  . Alcohol use 0.6 - 1.2 oz/week    1 - 2 Standard drinks or equivalent per week     Comment: drinks rarely  . Drug use: No  . Sexual activity: Not on file   Other Topics Concern  . Not on file   Social History Narrative  . No narrative on file     Current Outpatient Prescriptions:  .  amLODipine (NORVASC) 5 MG tablet, Take 1 tablet (5 mg total) by mouth daily., Disp: 90 tablet, Rfl: 1 .  CIALIS 20 MG tablet, TAKE 1 TABLET (20 MG TOTAL) BY MOUTH AS NEEDED., Disp: 6 tablet, Rfl: 1 .  cyclobenzaprine (FLEXERIL) 5 MG tablet, TAKE 1 TABLET (5 MG TOTAL) BY MOUTH 3 (THREE) TIMES DAILY AS NEEDED FOR MUSCLE SPASMS., Disp: 90 tablet, Rfl: 0 .  meloxicam (MOBIC) 7.5 MG tablet, TAKE 1 TABLET (7.5 MG TOTAL) BY MOUTH DAILY., Disp: 30 tablet, Rfl: 0 .  ramipril (ALTACE) 10 MG capsule, TAKE 1 CAPSULE (10 MG TOTAL) BY MOUTH  2 (TWO) TIMES DAILY., Disp: 180 capsule, Rfl: 1 .  traZODone (DESYREL) 50 MG tablet, Take 0.5-1 tablets (25-50 mg total) by mouth at bedtime as needed for sleep. (Patient not taking: Reported on 03/07/2017), Disp: 30 tablet, Rfl: 2 .  valACYclovir (VALTREX) 1000 MG tablet, TAKE 1 TABLET BY MOUTH EVERY DAY, Disp: 90 tablet, Rfl: 0 .  Vitamin D, Ergocalciferol, (DRISDOL) 50000 units CAPS capsule, Take 1 capsule (50,000 Units total) by mouth once a week. For 12 weeks (Patient not taking: Reported on 08/09/2016), Disp: 12 capsule, Rfl: 0  Allergies  Allergen Reactions  . Ciprofloxacin     Tendon damage     Review of Systems  Eyes: Negative for blurred vision.  Respiratory: Negative for shortness of breath.   Cardiovascular: Negative for chest pain, palpitations and orthopnea.   Neurological: Negative for headaches.    Please see history of present illness for complete description of ROS  Objective  Vitals:   03/07/17 1423  BP: 134/75  Pulse: 60  Resp: 16  Temp: 98.3 F (36.8 C)  TempSrc: Oral  SpO2: 98%  Weight: 195 lb 14.4 oz (88.9 kg)  Height: 5\' 11"  (1.803 m)    Physical Exam  Constitutional: He is oriented to person, place, and time and well-developed, well-nourished, and in no distress.  HENT:  Head: Normocephalic and atraumatic.  Cardiovascular: Normal rate, regular rhythm and normal heart sounds.   No murmur heard. Pulmonary/Chest: Effort normal and breath sounds normal. He has no wheezes.  Musculoskeletal:       Right ankle: He exhibits swelling.       Left ankle: He exhibits swelling.       Feet:  Raised lesion on the posterior aspect of right lower leg, and no surrounding e based on symptoms and confirmed exposure torythema  Neurological: He is alert and oriented to person, place, and time.  Psychiatric: Mood, memory, affect and judgment normal.  Nursing note and vitals reviewed.    Assessment & Plan  1. Tick bite of right lower leg, initial encounter Based on symptoms and confirmed tick exposure, will start on doxycycline, obtain serologies - doxycycline (VIBRA-TABS) 100 MG tablet; Take 1 tablet (100 mg total) by mouth 2 (two) times daily.  Dispense: 20 tablet; Refill: 0 - Rocky mtn spotted fvr ab, IgM-blood - Lyme Disease, IgM, Early Test w/ Rflx - Ehrlichia antibody panel - CBC with Differential/Platelet  2. Essential hypertension DC amlodipine because of peripheral edema, continue on ramipril, obtain BP logs and reviewed in 3 months   Kristen Fromm [facility] 03/07/2017 3:09 PM

## 2017-03-08 LAB — ROCKY MTN SPOTTED FVR ABS PNL(IGG+IGM)
RMSF IGG: NOT DETECTED
RMSF IgM: NOT DETECTED

## 2017-03-10 LAB — EHRLICHIA ANTIBODY PANEL: E chaffeensis (HGE) Ab, IgM: <1:20 {titer}

## 2017-03-16 ENCOUNTER — Encounter: Payer: Self-pay | Admitting: Family Medicine

## 2017-03-16 ENCOUNTER — Ambulatory Visit (INDEPENDENT_AMBULATORY_CARE_PROVIDER_SITE_OTHER): Payer: Medicare Other | Admitting: Family Medicine

## 2017-03-16 NOTE — Progress Notes (Signed)
This encounter was created in error - please disregard.Name: Frederick Nelson   MRN: [id number]    DOB: 1951-07-19   Date:03/16/2017       Progress Note  Subjective  Chief Complaint  Chief Complaint  Patient presents with  . Annual Exam    CPE    HPI    Past Medical History:  Diagnosis Date  . Abdominal pain, right lower quadrant   . Arthritis   . Degenerative joint disease (DJD) of hip   . Hypertension   . Malignant neoplasm of ascending colon ([place]) 2011  . Special screening for malignant neoplasms, colon 2011    Past Surgical History:  Procedure Laterality Date  . COLON SURGERY  06/26/2007   right hemicolectomy-T2N0 carcinoma of right colon. The tumor was 2.6 cm,low grade with zero of 14 nodes positive. No evidence of venous or lymphatic invasion  . COLONOSCOPY  [id number]  . COLONOSCOPY WITH PROPOFOL N/A 08/04/2015   Procedure: COLONOSCOPY WITH PROPOFOL;  Surgeon: [name], MD;  Location: [facility] ENDOSCOPY;  Service: Endoscopy;  Laterality: N/A;    Family History  Problem Relation Age of Onset  . Cancer Brother        cancer of the thymus  . Cancer Maternal Aunt        breast cancer    Social History   Social History  . Marital status: Divorced    Spouse name: N/A  . Number of children: N/A  . Years of education: N/A   Occupational History  . Not on file.   Social History Main Topics  . Smoking status: Never Smoker  . Smokeless tobacco: Never Used  . Alcohol use 0.6 - 1.2 oz/week    1 - 2 Standard drinks or equivalent per week     Comment: drinks rarely  . Drug use: No  . Sexual activity: Not on file   Other Topics Concern  . Not on file   Social History Narrative  . No narrative on file     Current Outpatient Prescriptions:  .  CIALIS 20 MG tablet, TAKE 1 TABLET (20 MG TOTAL) BY MOUTH AS NEEDED., Disp: 6 tablet, Rfl: 1 .  cyclobenzaprine (FLEXERIL) 5 MG tablet, TAKE 1 TABLET (5 MG TOTAL) BY MOUTH 3 (THREE) TIMES DAILY AS NEEDED FOR  MUSCLE SPASMS., Disp: 90 tablet, Rfl: 0 .  doxycycline (VIBRA-TABS) 100 MG tablet, Take 1 tablet (100 mg total) by mouth 2 (two) times daily., Disp: 20 tablet, Rfl: 0 .  meloxicam (MOBIC) 7.5 MG tablet, TAKE 1 TABLET (7.5 MG TOTAL) BY MOUTH DAILY., Disp: 30 tablet, Rfl: 0 .  ramipril (ALTACE) 10 MG capsule, TAKE 1 CAPSULE (10 MG TOTAL) BY MOUTH 2 (TWO) TIMES DAILY., Disp: 180 capsule, Rfl: 1 .  traZODone (DESYREL) 50 MG tablet, Take 0.5-1 tablets (25-50 mg total) by mouth at bedtime as needed for sleep., Disp: 30 tablet, Rfl: 2 .  valACYclovir (VALTREX) 1000 MG tablet, TAKE 1 TABLET BY MOUTH EVERY DAY, Disp: 90 tablet, Rfl: 0 .  Vitamin D, Ergocalciferol, (DRISDOL) 50000 units CAPS capsule, Take 1 capsule (50,000 Units total) by mouth once a week. For 12 weeks, Disp: 12 capsule, Rfl: 0  Allergies  Allergen Reactions  . Ciprofloxacin     Tendon damage     ROS    Objective  Vitals:   03/16/17 0945  BP: 134/76  Pulse: 60  Resp: 16  Temp: 97.8 F (36.6 C)  TempSrc: Oral  SpO2: 99%  Weight: 192 lb  6.4 oz (87.3 kg)  Height: '5\' 11"'$  (1.803 m)    Physical Exam     Recent Results (from the past 7106 hour(s))  Ehrlichia antibody panel     Status: None   Collection Time: 03/07/17  3:40 PM  Result Value Ref Range   E chaffeensis (HGE) Ab, IgG <1:64 <1:64   E chaffeensis (HGE) Ab, IgM <1:20 <1:20   Interpretaton REPORT     Comment: Antibody Not Detected   Comment REPORT     Comment: Ehrlichia chaffeenis has been identified as the causative agent of Human Monocytic Ehrlichiosis (HME). Infected individuals produce specific antibodies to E. chaffeensis that can be detected by an immuno- fluorescent antibody (IFA) test.  Single IgG IFA titers of 1:64 or greater indicate exposure to E. chaffeensis.  A four-fold rise in IgG titers between acute and convalescent samples and/or the presence of IgM antibody against E. chaffeensis suggest recent or current infection. This test was  developed and its analytical performance characteristics have been determined by [employer], [place], [place]. It has not been cleared or approved by the U.S. Food and Drug Administration. This assay has been validated pursuant to the CLIA regulations and is used for clinical purposes.   CBC with Differential/Platelet     Status: None   Collection Time: 03/07/17  3:40 PM  Result Value Ref Range   WBC 5.2 3.8 - 10.8 K/uL   RBC 4.90 4.20 - 5.80 MIL/uL   Hemoglobin 15.4 13.2 - 17.1 g/dL   HCT 46.5 38.5 - 50.0 %   MCV 94.9 80.0 - 100.0 fL   MCH 31.4 27.0 - 33.0 pg   MCHC 33.1 32.0 - 36.0 g/dL   RDW 13.7 11.0 - 15.0 %   Platelets 176 140 - 400 K/uL   MPV 10.4 7.5 - 12.5 fL   Neutro Abs 2,080 1,500 - 7,800 cells/uL   Lymphs Abs 2,548 850 - 3,900 cells/uL   Monocytes Absolute 364 200 - 950 cells/uL   Eosinophils Absolute 156 15 - 500 cells/uL   Basophils Absolute 52 0 - 200 cells/uL   Neutrophils Relative % 40 %   Lymphocytes Relative 49 %   Monocytes Relative 7 %   Eosinophils Relative 3 %   Basophils Relative 1 %   Smear Review Criteria for review not met   Rocky mtn spotted fvr abs pnl(IgG+IgM)     Status: None   Collection Time: 03/07/17  3:40 PM  Result Value Ref Range   RMSF IgG NOT DETECTED NOT DETECTED   RMSF IgM NOT DETECTED NOT DETECTED    Comment: Cross-reactivity between the Spotted Fever and Typhus groups is minor. Significant cross reactivity within the Spotted Fever or Typhus group precludes speciation of the rickettsiae.      Assessment & Plan  There are no diagnoses linked to this encounter.  Darris Staiger [facility] 03/16/2017 10:09 AM

## 2017-04-30 ENCOUNTER — Other Ambulatory Visit: Payer: Self-pay | Admitting: Family Medicine

## 2017-04-30 DIAGNOSIS — I10 Essential (primary) hypertension: Secondary | ICD-10-CM

## 2017-05-07 ENCOUNTER — Ambulatory Visit: Payer: Medicare Other

## 2017-05-14 ENCOUNTER — Encounter: Payer: Medicare Other | Admitting: Family Medicine

## 2017-05-29 ENCOUNTER — Encounter: Payer: Self-pay | Admitting: Family Medicine

## 2017-05-29 ENCOUNTER — Other Ambulatory Visit: Payer: Self-pay

## 2017-05-29 ENCOUNTER — Ambulatory Visit (INDEPENDENT_AMBULATORY_CARE_PROVIDER_SITE_OTHER): Payer: Medicare Other | Admitting: Family Medicine

## 2017-05-29 VITALS — BP 136/82 | HR 54 | Ht 71.0 in | Wt 196.6 lb

## 2017-05-29 DIAGNOSIS — Z0000 Encounter for general adult medical examination without abnormal findings: Secondary | ICD-10-CM | POA: Diagnosis not present

## 2017-05-29 DIAGNOSIS — Z23 Encounter for immunization: Secondary | ICD-10-CM

## 2017-05-29 DIAGNOSIS — Z1159 Encounter for screening for other viral diseases: Secondary | ICD-10-CM

## 2017-05-29 DIAGNOSIS — Z125 Encounter for screening for malignant neoplasm of prostate: Secondary | ICD-10-CM | POA: Diagnosis not present

## 2017-05-29 DIAGNOSIS — F5104 Psychophysiologic insomnia: Secondary | ICD-10-CM

## 2017-05-29 LAB — PSA: PSA: 1.1 ng/mL (ref <=4.0)

## 2017-05-29 NOTE — Progress Notes (Signed)
Name: Frederick Nelson   MRN: [id number]    DOB: [birth year]   Date:05/29/2017       Progress Note  Subjective  Chief Complaint  Chief Complaint  Patient presents with  . Hypertension  . Immunizations    flu shot today, wants to talk about shingles vaccine as well states he had the old vaccine     HPI    Past Medical History:  Diagnosis Date  . Abdominal pain, right lower quadrant   . Arthritis   . Chronic low back pain   . Degenerative joint disease (DJD) of hip   . Genital herpes    Takes Valtrex for prevention  . Hypertension   . Malignant neoplasm of ascending colon ([place]) 2011  . Special screening for malignant neoplasms, colon 2011    Past Surgical History:  Procedure Laterality Date  . COLON SURGERY  06/26/2007   right hemicolectomy-T2N0 carcinoma of right colon. The tumor was 2.6 cm,low grade with zero of 14 nodes positive. No evidence of venous or lymphatic invasion  . COLONOSCOPY  [id number]  . COLONOSCOPY WITH PROPOFOL N/A 08/04/2015   Procedure: COLONOSCOPY WITH PROPOFOL;  Surgeon: [name], MD;  Location: [facility] ENDOSCOPY;  Service: Endoscopy;  Laterality: N/A;    Family History  Problem Relation Age of Onset  . Cancer Brother        cancer of the thymus  . Cancer Maternal Aunt        breast cancer    Social History   Social History  . Marital status: Divorced    Spouse name: N/A  . Number of children: N/A  . Years of education: N/A   Occupational History  . Not on file.   Social History Main Topics  . Smoking status: Never Smoker  . Smokeless tobacco: Never Used  . Alcohol use 0.6 - 1.2 oz/week    1 - 2 Standard drinks or equivalent per week     Comment: drinks rarely  . Drug use: No  . Sexual activity: Yes    Birth control/ protection: None   Other Topics Concern  . Not on file   Social History Narrative  . No narrative on file     Current Outpatient Prescriptions:  .  CIALIS 20 MG tablet, TAKE 1 TABLET (20 MG TOTAL) BY  MOUTH AS NEEDED., Disp: 6 tablet, Rfl: 1 .  ramipril (ALTACE) 10 MG capsule, TAKE 1 CAPSULE (10 MG TOTAL) BY MOUTH 2 (TWO) TIMES DAILY., Disp: 180 capsule, Rfl: 1 .  traZODone (DESYREL) 50 MG tablet, Take 0.5-1 tablets (25-50 mg total) by mouth at bedtime as needed for sleep., Disp: 30 tablet, Rfl: 2 .  valACYclovir (VALTREX) 1000 MG tablet, TAKE 1 TABLET BY MOUTH EVERY DAY, Disp: 90 tablet, Rfl: 0  Allergies  Allergen Reactions  . Ciprofloxacin     Tendon damage     Review of Systems  Constitutional: Negative for chills, fever and malaise/fatigue.  HENT: Positive for sinus pain (has sinus congestion.). Negative for congestion, ear pain and sore throat.   Eyes: Negative for blurred vision, double vision and pain.  Respiratory: Negative for cough, shortness of breath and wheezing.   Cardiovascular: Negative for chest pain, palpitations and leg swelling (occasional leg swelling from being on his feet).  Gastrointestinal: Negative for abdominal pain, blood in stool, constipation, diarrhea, nausea and vomiting.  Genitourinary: Negative for hematuria and urgency.  Musculoskeletal: Positive for back pain. Negative for joint pain (hip pain with logging heavy weight)  and neck pain.  Neurological: Negative for dizziness and headaches.  Psychiatric/Behavioral: Negative for depression. The patient has insomnia. The patient is not nervous/anxious.     Objective  Vitals:   05/29/17 1038  BP: 136/82  Pulse: (!) 54  SpO2: 97%  Weight: 196 lb 9.6 oz (89.2 kg)  Height: 5\' 11"  (1.803 m)    Physical Exam  Constitutional: He is oriented to person, place, and time and well-developed, well-nourished, and in no distress.  HENT:  Head: Normocephalic and atraumatic.  Left Ear: Tympanic membrane and ear canal normal.  Mouth/Throat: No posterior oropharyngeal erythema.  right ear canal impacted wit cerumen  Neck: Neck supple.  Cardiovascular: Normal rate, regular rhythm, S1 normal, S2 normal and  normal heart sounds.   Pulmonary/Chest: Effort normal and breath sounds normal. No respiratory distress. He has no decreased breath sounds. He has no wheezes.  Abdominal: Soft. Bowel sounds are normal. There is no tenderness.  Genitourinary: Rectum normal and prostate normal. Prostate is not enlarged and not tender.  Musculoskeletal:       Right ankle: He exhibits swelling. No tenderness.       Left ankle: He exhibits swelling. No tenderness.  Neurological: He is alert and oriented to person, place, and time.  Skin: Skin is warm, dry and intact.  Psychiatric: Mood, memory, affect and judgment normal.  Nursing note and vitals reviewed.      Assessment & Plan  1. Flu vaccine need  - Flu vaccine HIGH DOSE PF (Fluzone High dose)   Frederick Schriever [facility] 05/29/2017 11:22 AM    Subjective:   Frederick Nelson is a 66 y.o. male who presents for an Initial Medicare Annual Wellness Visit.  Review of Systems     Subjective:   Frederick Nelson is a 66 y.o. male who presents for a Welcome to Medicare exam.   Review of Systems:        Objective:    Today's Vitals   05/29/17 1038  BP: 136/82  Pulse: (!) 54  SpO2: 97%  Weight: 196 lb 9.6 oz (89.2 kg)  Height: 5\' 11"  (1.803 m)   Body mass index is 27.42 kg/m.  Medications Outpatient Encounter Prescriptions as of 05/29/2017  Medication Sig  . CIALIS 20 MG tablet TAKE 1 TABLET (20 MG TOTAL) BY MOUTH AS NEEDED.  . ramipril (ALTACE) 10 MG capsule TAKE 1 CAPSULE (10 MG TOTAL) BY MOUTH 2 (TWO) TIMES DAILY.  . traZODone (DESYREL) 50 MG tablet Take 0.5-1 tablets (25-50 mg total) by mouth at bedtime as needed for sleep.  . valACYclovir (VALTREX) 1000 MG tablet TAKE 1 TABLET BY MOUTH EVERY DAY  . [DISCONTINUED] cyclobenzaprine (FLEXERIL) 5 MG tablet TAKE 1 TABLET (5 MG TOTAL) BY MOUTH 3 (THREE) TIMES DAILY AS NEEDED FOR MUSCLE SPASMS.  . [DISCONTINUED] doxycycline (VIBRA-TABS) 100  MG tablet Take 1 tablet (100 mg total) by mouth 2 (two) times daily.  . [DISCONTINUED] meloxicam (MOBIC) 7.5 MG tablet TAKE 1 TABLET (7.5 MG TOTAL) BY MOUTH DAILY.  . [DISCONTINUED] Vitamin D, Ergocalciferol, (DRISDOL) 50000 units CAPS capsule Take 1 capsule (50,000 Units total) by mouth once a week. For 12 weeks   No facility-administered encounter medications on file as of 05/29/2017.      History: Past Medical History:  Diagnosis Date  . Abdominal pain, right lower quadrant   . Arthritis   . Chronic low back pain   . Degenerative joint disease (DJD) of hip   .  Genital herpes    Takes Valtrex for prevention  . Hypertension   . Malignant neoplasm of ascending colon ([place]) 2011  . Special screening for malignant neoplasms, colon 2011   Past Surgical History:  Procedure Laterality Date  . COLON SURGERY  06/26/2007   right hemicolectomy-T2N0 carcinoma of right colon. The tumor was 2.6 cm,low grade with zero of 14 nodes positive. No evidence of venous or lymphatic invasion  . COLONOSCOPY  [id number]  . COLONOSCOPY WITH PROPOFOL N/A 08/04/2015   Procedure: COLONOSCOPY WITH PROPOFOL;  Surgeon: [name], MD;  Location: [facility] ENDOSCOPY;  Service: Endoscopy;  Laterality: N/A;    Family History  Problem Relation Age of Onset  . Cancer Brother        cancer of the thymus  . Cancer Maternal Aunt        breast cancer   Social History   Occupational History  . Not on file.   Social History Main Topics  . Smoking status: Never Smoker  . Smokeless tobacco: Never Used  . Alcohol use 0.6 - 1.2 oz/week    1 - 2 Standard drinks or equivalent per week     Comment: drinks rarely  . Drug use: No  . Sexual activity: Yes    Birth control/ protection: None   Tobacco Counseling Counseling given: Not Answered   Immunizations and Health Maintenance Immunization History  Administered Date(s) Administered  . Influenza, High Dose Seasonal PF 05/29/2017  . Influenza,inj,Quad PF,6+ Mos  06/18/2015, 05/11/2016   Health Maintenance Due  Topic Date Due  . Hepatitis C Screening  02-10-1951  . HIV Screening  12/13/1966  . PNA vac Low Risk Adult (1 of 2 - PCV13) 12/12/2016  . INFLUENZA VACCINE  03/28/2017    Activities of Daily Living In your present state of health, do you have any difficulty performing the following activities: 05/29/2017 03/16/2017  Hearing? N N  Vision? N Y  Comment - glasses  Difficulty concentrating or making decisions? N N  Walking or climbing stairs? N N  Dressing or bathing? N N  Doing errands, shopping? N N  Some recent data might be hidden  some degree of hearing loss. Has to wear glasses for 'close-up stuff'  Physical Exam  (optional), or other factors deemed appropriate based on the beneficiary's medical and social history and current clinical standards.  Advanced Directives:  Pt. does not have any advanced directives at present, he would like more information to review and then communicate his decision.      Assessment:    This is a routine wellness  examination for this patient .   Vision/Hearing screen No exam data present  Dietary issues and exercise activities discussed:  Current Exercise Habits: The patient does not participate in regular exercise at present, Exercise limited by: None identified   Diet: overall balanced diet, eats fruits, vegetables, meats every meal, frequently consume dairy and fried foods.  Exercise: Active physically but no formal exercise routine.    Goals    None      Depression Screen PHQ 2/9 Scores 05/29/2017 05/29/2017 03/16/2017 03/07/2017  PHQ - 2 Score 0 1 0 0     Fall Risk Fall Risk  05/29/2017  Falls in the past year? No    Cognitive Function    6-CIT score is 2, considered normal.  6CIT Screen 05/29/2017  What Year? 0 points  What month? 0 points  What time? 0 points  Count back from 20 0 points  Months  in reverse 0 points  Repeat phrase 2 points  Total Score 2    Patient  Care Team: Roselee Nova, MD as PCP - General (Family Medicine) Bary Castilla, Forest Gleason, MD (General Surgery) Duard Brady (Dentist).    Plan:    I have personally reviewed and noted the following in the patient's chart:   . Medical and social history . Use of alcohol, tobacco or illicit drugs  . Current medications and supplements . Functional ability and status . Nutritional status . Physical activity . Advanced directives . List of other physicians . Hospitalizations, surgeries, and ER visits in previous 12 months . Vitals . Screenings to include cognitive, depression, and falls . Referrals and appointments  In addition, I have reviewed and discussed with patient certain preventive protocols, quality metrics, and best practice recommendations. A written personalized care plan for preventive services as well as general preventive health recommendations were provided to patient.    Frederick Rake, MD 05/29/2017          Objective:    Today's Vitals   05/29/17 1038  BP: 136/82  Pulse: (!) 54  SpO2: 97%  Weight: 196 lb 9.6 oz (89.2 kg)  Height: 5\' 11"  (1.803 m)   Body mass index is 27.42 kg/m.  Current Medications (verified) Outpatient Encounter Prescriptions as of 05/29/2017  Medication Sig  . CIALIS 20 MG tablet TAKE 1 TABLET (20 MG TOTAL) BY MOUTH AS NEEDED.  . ramipril (ALTACE) 10 MG capsule TAKE 1 CAPSULE (10 MG TOTAL) BY MOUTH 2 (TWO) TIMES DAILY.  . traZODone (DESYREL) 50 MG tablet Take 0.5-1 tablets (25-50 mg total) by mouth at bedtime as needed for sleep.  . valACYclovir (VALTREX) 1000 MG tablet TAKE 1 TABLET BY MOUTH EVERY DAY  . [DISCONTINUED] cyclobenzaprine (FLEXERIL) 5 MG tablet TAKE 1 TABLET (5 MG TOTAL) BY MOUTH 3 (THREE) TIMES DAILY AS NEEDED FOR MUSCLE SPASMS.  . [DISCONTINUED] doxycycline (VIBRA-TABS) 100 MG tablet Take 1 tablet (100 mg total) by mouth 2 (two) times daily.  . [DISCONTINUED] meloxicam (MOBIC) 7.5 MG tablet TAKE 1 TABLET (7.5 MG TOTAL)  BY MOUTH DAILY.  . [DISCONTINUED] Vitamin D, Ergocalciferol, (DRISDOL) 50000 units CAPS capsule Take 1 capsule (50,000 Units total) by mouth once a week. For 12 weeks   No facility-administered encounter medications on file as of 05/29/2017.     Allergies (verified) Ciprofloxacin   History: Past Medical History:  Diagnosis Date  . Abdominal pain, right lower quadrant   . Arthritis   . Chronic low back pain   . Degenerative joint disease (DJD) of hip   . Genital herpes    Takes Valtrex for prevention  . Hypertension   . Malignant neoplasm of ascending colon ([place]) 2011  . Special screening for malignant neoplasms, colon 2011   Past Surgical History:  Procedure Laterality Date  . COLON SURGERY  06/26/2007   right hemicolectomy-T2N0 carcinoma of right colon. The tumor was 2.6 cm,low grade with zero of 14 nodes positive. No evidence of venous or lymphatic invasion  . COLONOSCOPY  [id number]  . COLONOSCOPY WITH PROPOFOL N/A 08/04/2015   Procedure: COLONOSCOPY WITH PROPOFOL;  Surgeon: [name], MD;  [facility] END[facility]COPY;  Service: Endoscopy;  Laterality: N/A;   Family History  Problem Relation Age of Onset  . Cancer Brother        cancer of the thymus  . Cancer Maternal Aunt        breast cancer   Social History   Occupational  History  . Not on file.   Social History Main Topics  . Smoking status: Never Smoker  . Smokeless tobacco: Never Used  . Alcohol use 0.6 - 1.2 oz/week    1 - 2 Standard drinks or equivalent per week     Comment: drinks rarely  . Drug use: No  . Sexual activity: Yes    Birth control/ protection: None   Tobacco Counseling Counseling given: Not Answered   Activities of Daily Living In your present state of health, do you have any difficulty performing the following activities: 05/29/2017 03/16/2017  Hearing? N N  Vision? N Y  Comment - glasses  Difficulty concentrating or making decisions? N N  Walking or climbing stairs? N N    Dressing or bathing? N N  Doing errands, shopping? N N  Some recent data might be hidden    Immunizations and Health Maintenance Immunization History  Administered Date(s) Administered  . Influenza, High Dose Seasonal PF 05/29/2017  . Influenza,inj,Quad PF,6+ Mos 06/18/2015, 05/11/2016   Health Maintenance Due  Topic Date Due  . Hepatitis C Screening  1951-01-24  . HIV Screening  12/13/1966  . PNA vac Low Risk Adult (1 of 2 - PCV13) 12/12/2016  . INFLUENZA VACCINE  03/28/2017    Patient Care Team: Roselee Nova, MD as PCP - General (Family Medicine) Bary Castilla Forest Gleason, MD (General Surgery)  Indicate any recent Medical Services you may have received from other than [facility] providers in the past year (date may be approximate).    Assessment:   This is a routine wellness examination for Frederick Nelson.   Hearing/Vision screen No exam data present  Dietary issues and exercise activities discussed: Current Exercise Habits: The patient does not participate in regular exercise at present, Exercise limited by: None identified  Goals    None     Depression Screen PHQ 2/9 Scores 05/29/2017 05/29/2017 03/16/2017 03/07/2017  PHQ - 2 Score 0 1 0 0    Fall Risk Fall Risk  05/29/2017 03/16/2017 03/07/2017 12/08/2016 05/11/2016  Falls in the past year? No No No No No    Cognitive Function:     6CIT Screen 05/29/2017  What Year? 0 points  What month? 0 points  What time? 0 points  Count back from 20 0 points  Months in reverse 0 points  Repeat phrase 2 points  Total Score 2    Screening Tests Health Maintenance  Topic Date Due  . Hepatitis C Screening  [birth year]  . HIV Screening  12/13/1966  . PNA vac Low Risk Adult (1 of 2 - PCV13) 12/12/2016  . INFLUENZA VACCINE  03/28/2017  . TETANUS/TDAP  02/01/2023  . COLONOSCOPY  08/03/2025        Plan:     I have personally reviewed and noted the following in the patient's chart:   . Medical and social history . Use of alcohol,  tobacco or illicit drugs  . Current medications and supplements . Functional ability and status . Nutritional status . Physical activity . Advanced directives . List of other physicians . Hospitalizations, surgeries, and ER visits in previous 12 months . Vitals . Screenings to include cognitive, depression, and falls . Referrals and appointments  In addition, I have reviewed and discussed with patient certain preventive protocols, quality metrics, and best practice recommendations. A written personalized care plan for preventive services as well as general preventive health recommendations were provided to patient.     Frederick Rake, MD   05/29/2017

## 2017-05-29 NOTE — Patient Instructions (Signed)
Personalized Care Plan for Preventive Services:  1. Annual Subsequent exams for medicare. 2. Flu shot every year 3. Colonoscopy screening as identified by Dr. Bary Castilla 4. PSA every year 5. Prevnar 13 in 1 year 6. Routine screening labs including CBC, vitamin D, TSH every year. 7. Continue with healthy dietary and lifestyle choices.

## 2017-05-29 NOTE — Telephone Encounter (Signed)
90 day refill request was sent to Dr. Okey Dupre A. Manuella Ghazi for approval and submission.

## 2017-05-30 ENCOUNTER — Other Ambulatory Visit: Payer: Self-pay | Admitting: Family Medicine

## 2017-05-30 DIAGNOSIS — F5104 Psychophysiologic insomnia: Secondary | ICD-10-CM

## 2017-05-30 LAB — HEPATITIS C ANTIBODY
HEP C AB: NONREACTIVE
SIGNAL TO CUT-OFF: 0.03 (ref <1.00)

## 2017-05-30 LAB — EXTRA LAV TOP TUBE

## 2017-05-30 MED ORDER — TRAZODONE HCL 50 MG PO TABS: 25.0000 mg | ORAL_TABLET | Freq: Every evening | ORAL | 0 refills | Status: DC | PRN

## 2017-05-31 ENCOUNTER — Other Ambulatory Visit: Payer: Self-pay

## 2017-05-31 DIAGNOSIS — F5104 Psychophysiologic insomnia: Secondary | ICD-10-CM

## 2017-05-31 MED ORDER — TRAZODONE HCL 50 MG PO TABS: 25.0000 mg | ORAL_TABLET | Freq: Every evening | ORAL | 0 refills | Status: DC | PRN

## 2017-05-31 NOTE — Telephone Encounter (Signed)
Pt requests 90 day supply

## 2017-06-01 ENCOUNTER — Telehealth: Payer: Self-pay

## 2017-06-01 NOTE — Telephone Encounter (Signed)
Called pt informed him of normal labs. Pt gave verbal understanding

## 2017-06-01 NOTE — Telephone Encounter (Signed)
-----   Message from Roselee Nova, MD sent at 05/30/2017  7:49 PM EDT ----- PSA is within normal range Hep C antibody is negative

## 2017-06-05 ENCOUNTER — Other Ambulatory Visit: Payer: Self-pay | Admitting: Family Medicine

## 2017-06-16 ENCOUNTER — Other Ambulatory Visit: Payer: Self-pay | Admitting: Family Medicine

## 2017-08-25 ENCOUNTER — Other Ambulatory Visit: Payer: Self-pay | Admitting: Family Medicine

## 2017-08-25 DIAGNOSIS — N529 Male erectile dysfunction, unspecified: Secondary | ICD-10-CM

## 2017-08-29 ENCOUNTER — Ambulatory Visit: Payer: Medicare Other | Admitting: Family Medicine

## 2017-09-07 ENCOUNTER — Other Ambulatory Visit: Payer: Self-pay | Admitting: Family Medicine

## 2017-09-07 DIAGNOSIS — F5104 Psychophysiologic insomnia: Secondary | ICD-10-CM

## 2017-10-09 ENCOUNTER — Other Ambulatory Visit: Payer: Self-pay | Admitting: Family Medicine

## 2017-10-30 ENCOUNTER — Other Ambulatory Visit: Payer: Self-pay

## 2017-10-30 DIAGNOSIS — N529 Male erectile dysfunction, unspecified: Secondary | ICD-10-CM

## 2017-10-31 NOTE — Telephone Encounter (Signed)
Called number [phone] @ 11:33. lvm for patient to schedule appt with Dr Ancil Boozer to get refill on medication

## 2017-11-01 ENCOUNTER — Encounter: Payer: Self-pay | Admitting: Family Medicine

## 2017-11-01 ENCOUNTER — Ambulatory Visit (INDEPENDENT_AMBULATORY_CARE_PROVIDER_SITE_OTHER): Payer: PRIVATE HEALTH INSURANCE | Admitting: Family Medicine

## 2017-11-01 VITALS — BP 120/82 | Temp 98.1°F | Resp 18 | Ht 71.0 in | Wt 201.3 lb

## 2017-11-01 DIAGNOSIS — N529 Male erectile dysfunction, unspecified: Secondary | ICD-10-CM

## 2017-11-01 DIAGNOSIS — A6001 Herpesviral infection of penis: Secondary | ICD-10-CM | POA: Insufficient documentation

## 2017-11-01 DIAGNOSIS — J302 Other seasonal allergic rhinitis: Secondary | ICD-10-CM

## 2017-11-01 DIAGNOSIS — I10 Essential (primary) hypertension: Secondary | ICD-10-CM

## 2017-11-01 DIAGNOSIS — Z13220 Encounter for screening for lipoid disorders: Secondary | ICD-10-CM

## 2017-11-01 DIAGNOSIS — F5104 Psychophysiologic insomnia: Secondary | ICD-10-CM

## 2017-11-01 DIAGNOSIS — A6002 Herpesviral infection of other male genital organs: Secondary | ICD-10-CM | POA: Insufficient documentation

## 2017-11-01 DIAGNOSIS — R220 Localized swelling, mass and lump, head: Secondary | ICD-10-CM

## 2017-11-01 LAB — LIPID PANEL
CHOL/HDL RATIO: 3.5 (calc) (ref <5.0)
Cholesterol: 208 mg/dL — ABNORMAL HIGH (ref <200)
HDL: 60 mg/dL (ref >40)
LDL Cholesterol (Calc): 129 mg/dL (calc) — ABNORMAL HIGH
NON-HDL CHOLESTEROL (CALC): 148 mg/dL — AB (ref <130)
Triglycerides: 88 mg/dL (ref <150)

## 2017-11-01 LAB — COMPLETE METABOLIC PANEL WITH GFR
AG Ratio: 1.6 (calc) (ref 1.0–2.5)
ALT: 25 U/L (ref 9–46)
AST: 20 U/L (ref 10–35)
Albumin: 4.7 g/dL (ref 3.6–5.1)
Alkaline phosphatase (APISO): 62 U/L (ref 40–115)
BUN / CREAT RATIO: 11 (calc) (ref 6–22)
BUN: 17 mg/dL (ref 7–25)
CO2: 29 mmol/L (ref 20–32)
CREATININE: 1.51 mg/dL — AB (ref 0.70–1.25)
Calcium: 9.6 mg/dL (ref 8.6–10.3)
Chloride: 105 mmol/L (ref 98–110)
GFR, EST NON AFRICAN AMERICAN: 48 mL/min/{1.73_m2} — AB (ref >=60)
GFR, Est African American: 55 mL/min/{1.73_m2} — ABNORMAL LOW (ref >=60)
GLOBULIN: 2.9 g/dL (ref 1.9–3.7)
Glucose, Bld: 96 mg/dL (ref 65–99)
Potassium: 4.2 mmol/L (ref 3.5–5.3)
SODIUM: 142 mmol/L (ref 135–146)
Total Bilirubin: 0.7 mg/dL (ref 0.2–1.2)
Total Protein: 7.6 g/dL (ref 6.1–8.1)

## 2017-11-01 MED ORDER — VALACYCLOVIR HCL 1 G PO TABS: 1000.0000 mg | ORAL_TABLET | Freq: Every day | ORAL | 3 refills | Status: DC

## 2017-11-01 MED ORDER — LORATADINE 10 MG PO TABS: 10.0000 mg | ORAL_TABLET | Freq: Every day | ORAL | 1 refills | Status: DC

## 2017-11-01 MED ORDER — TADALAFIL 20 MG PO TABS: ORAL_TABLET | ORAL | 0 refills | Status: DC

## 2017-11-01 MED ORDER — NEBIVOLOL HCL 20 MG PO TABS: 0.5000 | ORAL_TABLET | Freq: Every day | ORAL | 0 refills | Status: DC

## 2017-11-01 MED ORDER — TRAZODONE HCL 50 MG PO TABS: 75.0000 mg | ORAL_TABLET | Freq: Every evening | ORAL | 0 refills | Status: DC | PRN

## 2017-11-01 NOTE — Progress Notes (Signed)
Name: Frederick Nelson   MRN: [id number]    DOB: 26-Sep-1950   Date:11/01/2017       Progress Note  Subjective  Chief Complaint  Chief Complaint  Patient presents with  . Medication Refill  . Insomnia    trazodone not working for him    HPI   HTN: BP below goal; at home it is normally 130-140/70-80's, checks at home 3 times a week. Denies BLE edema, chest pain, shortness of breath, palpitations, headaches, blurred vision.  He was taken off of amlodipine for a period of time due to complaints of ankle swelling, but restarted on his own about 3 months ago - says he has not noticed swelling since restarting.  Took HCTZ in the past but was taken off of this due to mildly decreased kidney function.  We will check CMP today and determine ability to return to taking HCTZ.  Oral Swelling: Notes two episodes of right lower lip swelling in the last 2 months.  Swelling decreased after taking benadryl and using a topical benadryl.  Denies tongue swelling, throat itching/closing, chest pain, or shortness of breath during these episodes. Discussed potential for this being angioedema - we will switch BP medications today - see discussion below  Erectile Dysfunction: Taking Cialis - this works well at current dosing - he usually takes 1/2-1 tablet as needed. Denies lightheadedness, dizziness, chest pain, shortness of breath.   Insomnia: He has struggled with insomnia for many years - has tried several different medications in the past, has tried Ambien recently and this helped him sleep very well.  Restarted Trazodone in January 2019 to see if this would help, and he feels like his current dose is not helping. Averaging about 3-4 hours of sleep a night.  He is not completely exhausted throughout the day, but after a few days of not sleeping he becomes tired and sleeps for several hours.  Has blackout curtains on windows, does not have any screen time before bed.  Does not have a routine now because he is no  longer working.  Advised against Lorrin Mais as he is 67, almost 67yo and discussed the safety issues of this medications. Advised we may try buspar in the future if trazodone not working.  Seasonal Allergic Rhinitis: He notes worsening seasonal allergies over the last year.  He endorses nasal congestion that is worse in the winter and spring.  He has tried a nasal spray in the past with minimal relief.   Is willing to try to Claritin today.   Patient Active Problem List   Diagnosis Date Noted  . Erectile dysfunction 08/09/2016  . Vitamin D deficiency 08/09/2016  . Annual physical exam 05/11/2016  . Blurred vision, bilateral 04/27/2016  . 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 Hypertension 01/26/2016  . Chronic insomnia 01/26/2016  . Bilateral leg edema 01/26/2016  . History of colon cancer 06/10/2015  . Malignant neoplasm of ascending colon [facility])     Past Surgical History:  Procedure Laterality Date  . COLON SURGERY  06/26/2007   right hemicolectomy-T2N0 carcinoma of right colon. The tumor was 2.6 cm,low grade with zero of 14 nodes positive. No evidence of venous or lymphatic invasion  . COLONOSCOPY  [id number]  . COLONOSCOPY WITH PROPOFOL N/A 08/04/2015   Procedure: COLONOSCOPY WITH PROPOFOL;  Surgeon: [name], MD;  Location: [facility] ENDOSCOPY;  Service: Endoscopy;  Laterality: N/A;    Family History  Problem Relation Age of Onset  . Cancer Brother  cancer of the thymus  . Cancer Maternal Aunt        breast cancer    Social History   Socioeconomic History  . Marital status: Divorced    Spouse name: Not on file  . Number of children: Not on file  . Years of education: Not on file  . Highest education level: Not on file  Social Needs  . Financial resource strain: Not on file  . Food insecurity - worry: Not on file  . Food insecurity - inability: Not on file  . Transportation needs - medical: Not on file  . Transportation needs - non-medical: Not on  file  Occupational History  . Not on file  Tobacco Use  . Smoking status: Never Smoker  . Smokeless tobacco: Never Used  Substance and Sexual Activity  . Alcohol use: Yes    Alcohol/week: 0.6 - 1.2 oz    Types: 1 - 2 Standard drinks or equivalent per week    Comment: drinks rarely  . Drug use: No  . Sexual activity: Yes    Birth control/protection: None  Other Topics Concern  . Not on file  Social History Narrative  . Not on file     Current Outpatient Medications:  .  ramipril (ALTACE) 10 MG capsule, TAKE 1 CAPSULE (10 MG TOTAL) BY MOUTH 2 (TWO) TIMES DAILY., Disp: 180 capsule, Rfl: 1 .  tadalafil (CIALIS) 20 MG tablet, TAKE 1 TABLET (20 MG TOTAL) BY MOUTH AS NEEDED., Disp: 6 tablet, Rfl: 1 .  valACYclovir (VALTREX) 1000 MG tablet, TAKE 1 TABLET BY MOUTH DAILY, Disp: 90 tablet, Rfl: 0 .  traZODone (DESYREL) 50 MG tablet, TAKE 0.5-1 TABLETS (25-50 MG TOTAL) BY MOUTH AT BEDTIME AS NEEDED FOR SLEEP. (Patient not taking: Reported on 11/01/2017), Disp: 90 tablet, Rfl: 0  Allergies  Allergen Reactions  . Ciprofloxacin     Tendon damage    ROS Constitutional: Negative for fever or weight change.  Respiratory: Negative for cough and shortness of breath.   Cardiovascular: Negative for chest pain or palpitations.  Gastrointestinal: Negative for abdominal pain, no bowel changes.  Musculoskeletal: Negative for gait problem or joint swelling.  Skin: Negative for rash.  Neurological: Negative for dizziness or headache.  No other specific complaints in a complete review of systems (except as listed in HPI above).  Objective  Vitals:   11/01/17 1004  BP: 120/82  Resp: 18  Temp: 98.1 F (36.7 C)  TempSrc: Oral  SpO2: 98%  Weight: 201 lb 4.8 oz (91.3 kg)  Height: 5\' 11"  (1.803 m)   Body mass index is 28.08 kg/m.  Physical Exam Constitutional: Patient appears well-developed and well-nourished. No distress.  HENT: Head: Normocephalic and atraumatic. Eyes: Conjunctivae and EOM  are normal. Pupils are equal, round, and reactive to light. No scleral icterus.  Neck: Normal range of motion. Neck supple. No JVD present. No thyromegaly present.  Cardiovascular: Normal rate, regular rhythm and normal heart sounds.  No murmur heard. Trace BLE edema. Pulmonary/Chest: Effort normal and breath sounds normal. No respiratory distress. Abdominal: Soft. Bowel sounds are normal, no distension. There is no tenderness. no masses Musculoskeletal: Normal range of motion, no joint effusions. No gross deformities Neurological: he is alert and oriented to person, place, and time. No cranial nerve deficit. Coordination, balance, strength, speech and gait are normal.  Skin: Skin is warm and dry. No rash noted. No erythema.  Psychiatric: Patient has a normal mood and affect. behavior is normal. Judgment and thought content  normal.  No results found for this or any previous visit (from the past 72 hour(s)).  PHQ2/9: Depression screen [facility] 2/9 05/29/2017 05/29/2017 03/16/2017 03/07/2017 12/08/2016  Decreased Interest 0 0 0 0 0  Down, Depressed, Hopeless 0 1 0 0 0  PHQ - 2 Score 0 1 0 0 0   Fall Risk: Fall Risk  05/29/2017 03/16/2017 03/07/2017 12/08/2016 05/11/2016  Falls in the past year? No No No No No   Assessment & Plan  1. Chronic insomnia - traZODone (DESYREL) 50 MG tablet; Take 1.5 tablets (75 mg total) by mouth at bedtime as needed for sleep.  Dispense: 135 tablet; Refill: 0  2. Essential hypertension - STOP amlodipine and ramipril - amlodipine likely causing BLE swelling and ramipril likely causing angioedema.  Discussed the importance of stopping these medication and not restarting without discussing with his PCP first.  - COMPLETE METABOLIC PANEL WITH GFR - Nebivolol HCl (BYSTOLIC) 20 MG TABS; Take 0.5-1 tablets (10-20 mg total) by mouth daily.  Dispense: 90 tablet; Refill: 0 - Pt will start with 10mg  (1/2 tablet) of bystolic and follow up in 2 weeks for BP check. - Advised if  angioedema occurs again he MUST present for emergency care as it can progress to significantly worse oral swelling. - We will check CMP today - if kidney function is adequate, will consider diuretic in the future as he is currently unable to take ACE-I/ARB due to concerns for angioedema nor should he take CCB's due to BLE edema.  3. Erectile dysfunction, unspecified erectile dysfunction type - tadalafil (CIALIS) 20 MG tablet; TAKE 1 TABLET (20 MG TOTAL) BY MOUTH AS NEEDED.  Dispense: 20 tablet; Refill: 0 - Discussed potential for hypotension while taking antihypertensive medications, pt verbalizes understanding and he has been doing well on medications for several years.  4. Herpes simplex infection of penis - Stable - valACYclovir (VALTREX) 1000 MG tablet; Take 1 tablet (1,000 mg total) by mouth daily.  Dispense: 90 tablet; Refill: 3  5. Screening for hyperlipidemia - Lipid panel  6. Seasonal allergic rhinitis, unspecified trigger - loratadine (CLARITIN) 10 MG tablet; Take 1 tablet (10 mg total) by mouth daily.  Dispense: 90 tablet; Refill: 1  7. Lip swelling - STOP ramipril as this is likely causing angioedema. Advised to seek emergency care if this occurs again as it is likely to worsen with subsequent episodes.

## 2017-11-01 NOTE — Patient Instructions (Addendum)
STOP Amlodipine, STOP Ramipril.  START Bystolic (1/2 tablet - 10mg  daily).  If Bystolic is too expensive, call our office for directions.  Insomnia Insomnia is a sleep disorder that makes it difficult to fall asleep or to stay asleep. Insomnia can cause tiredness (fatigue), low energy, difficulty concentrating, mood swings, and poor performance at work or school. There are three different ways to classify insomnia:  Difficulty falling asleep.  Difficulty staying asleep.  Waking up too early in the morning.  Any type of insomnia can be long-term (chronic) or short-term (acute). Both are common. Short-term insomnia usually lasts for three months or less. Chronic insomnia occurs at least three times a week for longer than three months. What are the causes? Insomnia may be caused by another condition, situation, or substance, such as:  Anxiety.  Certain medicines.  Gastroesophageal reflux disease (GERD) or other gastrointestinal conditions.  Asthma or other breathing conditions.  Restless legs syndrome, sleep apnea, or other sleep disorders.  Chronic pain.  Menopause. This may include hot flashes.  Stroke.  Abuse of alcohol, tobacco, or illegal drugs.  Depression.  Caffeine.  Neurological disorders, such as Alzheimer disease.  An overactive thyroid (hyperthyroidism).  The cause of insomnia may not be known. What increases the risk? Risk factors for insomnia include:  Gender. Women are more commonly affected than men.  Age. Insomnia is more common as you get older.  Stress. This may involve your professional or personal life.  Income. Insomnia is more common in people with lower income.  Lack of exercise.  Irregular work schedule or night shifts.  Traveling between different time zones.  What are the signs or symptoms? If you have insomnia, trouble falling asleep or trouble staying asleep is the main symptom. This may lead to other symptoms, such as:  Feeling  fatigued.  Feeling nervous about going to sleep.  Not feeling rested in the morning.  Having trouble concentrating.  Feeling irritable, anxious, or depressed.  How is this treated? Treatment for insomnia depends on the cause. If your insomnia is caused by an underlying condition, treatment will focus on addressing the condition. Treatment may also include:  Medicines to help you sleep.  Counseling or therapy.  Lifestyle adjustments.  Follow these instructions at home:  Take medicines only as directed by your health care provider.  Keep regular sleeping and waking hours. Avoid naps.  Keep a sleep diary to help you and your health care provider figure out what could be causing your insomnia. Include: ? When you sleep. ? When you wake up during the night. ? How well you sleep. ? How rested you feel the next day. ? Any side effects of medicines you are taking. ? What you eat and drink.  Make your bedroom a comfortable place where it is easy to fall asleep: ? Put up shades or special blackout curtains to block light from outside. ? Use a white noise machine to block noise. ? Keep the temperature cool.  Exercise regularly as directed by your health care provider. Avoid exercising right before bedtime.  Use relaxation techniques to manage stress. Ask your health care provider to suggest some techniques that may work well for you. These may include: ? Breathing exercises. ? Routines to release muscle tension. ? Visualizing peaceful scenes.  Cut back on alcohol, caffeinated beverages, and cigarettes, especially close to bedtime. These can disrupt your sleep.  Do not overeat or eat spicy foods right before bedtime. This can lead to digestive discomfort that can  make it hard for you to sleep.  Limit screen use before bedtime. This includes: ? Watching TV. ? Using your smartphone, tablet, and computer.  Stick to a routine. This can help you fall asleep faster. Try to do a  quiet activity, brush your teeth, and go to bed at the same time each night.  Get out of bed if you are still awake after 15 minutes of trying to sleep. Keep the lights down, but try reading or doing a quiet activity. When you feel sleepy, go back to bed.  Make sure that you drive carefully. Avoid driving if you feel very sleepy.  Keep all follow-up appointments as directed by your health care provider. This is important. Contact a health care provider if:  You are tired throughout the day or have trouble in your daily routine due to sleepiness.  You continue to have sleep problems or your sleep problems get worse. Get help right away if:  You have serious thoughts about hurting yourself or someone else. This information is not intended to replace advice given to you by your health care provider. Make sure you discuss any questions you have with your health care provider. Document Released: 08/11/2000 Document Revised: 01/14/2016 Document Reviewed: 05/15/2014 Elsevier Interactive Patient Education  [employer].

## 2017-11-02 ENCOUNTER — Other Ambulatory Visit: Payer: Self-pay | Admitting: Family Medicine

## 2017-11-02 DIAGNOSIS — E785 Hyperlipidemia, unspecified: Secondary | ICD-10-CM | POA: Insufficient documentation

## 2017-11-02 MED ORDER — ATORVASTATIN CALCIUM 20 MG PO TABS: 20.0000 mg | ORAL_TABLET | Freq: Every day | ORAL | 0 refills | Status: DC

## 2017-11-15 ENCOUNTER — Ambulatory Visit: Payer: Medicare Other | Admitting: Family Medicine

## 2017-11-29 ENCOUNTER — Ambulatory Visit: Payer: Medicare Other | Admitting: Family Medicine

## 2017-12-27 ENCOUNTER — Other Ambulatory Visit: Payer: Self-pay | Admitting: Family Medicine

## 2017-12-27 ENCOUNTER — Telehealth: Payer: Self-pay | Admitting: Family Medicine

## 2017-12-27 MED ORDER — HYDROCHLOROTHIAZIDE 12.5 MG PO TABS: 12.5000 mg | ORAL_TABLET | Freq: Every day | ORAL | 0 refills | Status: DC

## 2017-12-27 MED ORDER — AMLODIPINE BESYLATE 5 MG PO TABS: 5.0000 mg | ORAL_TABLET | Freq: Every day | ORAL | 0 refills | Status: DC

## 2017-12-27 NOTE — Telephone Encounter (Signed)
Stop bystolic, cannot go to previous medication because of angioedema, we will try norvasc 5 mg and add hctz , must be seen in one month to get additional refills.

## 2017-12-27 NOTE — Telephone Encounter (Signed)
Pt says that on his last visit that the Dr changed his medication for his blood pressure to bystolic and he wants to be paced back on the medication that he was on before the change. Says that he is going to be out of town as of tomorrow 12-28-17 and he needs it for he has no more to take. Pharm is [facility] [place].

## 2017-12-27 NOTE — Telephone Encounter (Signed)
Have never met this patient, can discuss changing BP medicines in person but will not change over the phone. Will send to Dr. Ancil Boozer, his PCP.

## 2017-12-27 NOTE — Telephone Encounter (Signed)
Please advise 

## 2017-12-27 NOTE — Telephone Encounter (Signed)
Left detailed voicemail

## 2018-01-13 ENCOUNTER — Other Ambulatory Visit: Payer: Self-pay | Admitting: Family Medicine

## 2018-01-13 DIAGNOSIS — N529 Male erectile dysfunction, unspecified: Secondary | ICD-10-CM

## 2018-01-18 ENCOUNTER — Other Ambulatory Visit: Payer: Self-pay | Admitting: Family Medicine

## 2018-02-05 ENCOUNTER — Ambulatory Visit (INDEPENDENT_AMBULATORY_CARE_PROVIDER_SITE_OTHER): Payer: Medicare Other | Admitting: Family Medicine

## 2018-02-05 ENCOUNTER — Encounter: Payer: Self-pay | Admitting: Family Medicine

## 2018-02-05 ENCOUNTER — Other Ambulatory Visit: Payer: Self-pay | Admitting: Family Medicine

## 2018-02-05 VITALS — BP 170/90 | HR 48 | Temp 98.0°F | Resp 14 | Ht 71.0 in | Wt 204.4 lb

## 2018-02-05 DIAGNOSIS — N183 Chronic kidney disease, stage 3 unspecified: Secondary | ICD-10-CM

## 2018-02-05 DIAGNOSIS — E7800 Pure hypercholesterolemia, unspecified: Secondary | ICD-10-CM | POA: Diagnosis not present

## 2018-02-05 DIAGNOSIS — J302 Other seasonal allergic rhinitis: Secondary | ICD-10-CM

## 2018-02-05 DIAGNOSIS — J3089 Other allergic rhinitis: Secondary | ICD-10-CM

## 2018-02-05 DIAGNOSIS — Z87898 Personal history of other specified conditions: Secondary | ICD-10-CM

## 2018-02-05 DIAGNOSIS — R001 Bradycardia, unspecified: Secondary | ICD-10-CM

## 2018-02-05 DIAGNOSIS — I129 Hypertensive chronic kidney disease with stage 1 through stage 4 chronic kidney disease, or unspecified chronic kidney disease: Secondary | ICD-10-CM | POA: Diagnosis not present

## 2018-02-05 DIAGNOSIS — N529 Male erectile dysfunction, unspecified: Secondary | ICD-10-CM | POA: Diagnosis not present

## 2018-02-05 DIAGNOSIS — F5104 Psychophysiologic insomnia: Secondary | ICD-10-CM

## 2018-02-05 DIAGNOSIS — A6001 Herpesviral infection of penis: Secondary | ICD-10-CM | POA: Diagnosis not present

## 2018-02-05 DIAGNOSIS — I10 Essential (primary) hypertension: Secondary | ICD-10-CM

## 2018-02-05 MED ORDER — VALSARTAN-HYDROCHLOROTHIAZIDE 160-12.5 MG PO TABS: 1.0000 | ORAL_TABLET | Freq: Every day | ORAL | 0 refills | Status: DC

## 2018-02-05 MED ORDER — SILDENAFIL CITRATE 20 MG PO TABS: 20.0000 mg | ORAL_TABLET | Freq: Three times a day (TID) | ORAL | 0 refills | Status: DC

## 2018-02-05 MED ORDER — ATORVASTATIN CALCIUM 40 MG PO TABS: 40.0000 mg | ORAL_TABLET | Freq: Every day | ORAL | 0 refills | Status: DC

## 2018-02-05 NOTE — Progress Notes (Signed)
Name: Frederick Nelson   MRN: [id number]    DOB: [birth year]   Date:02/05/2018       Progress Note  Subjective  Chief Complaint  Chief Complaint  Patient presents with  . Medication Refill  . Hypertension    quit taking HCTZ due to border line kidney function- stopped Amlodipine due to edema- has only been taking Bystolic   . Erectile Dysfunction  . Hyperlipidemia    does not take Atorvastatin  . Insomnia    HPI  HTN: he was switched from Quinapril because of angioedema to Bystolic and has noticed fatigue during the afternoon, also swelling of both legs that is worse at the end of the day. No chest pain or SOB. He has taken norvasc in the past but caused swelling in the past and does not want to go back on medication . He has CKI stage III   Bradycardia: may be the cause of sedation in the pm's. Started after he was given bystolic   Hyperlipidemia: with ASCVD of 13.8%, he was given rx of Lipitor back in March but never filled medication because he was afraid of side effects, however after discussion today he will try and call back if he develops myalgia.   ED: he states Cialis is too expensive and would like to try generic cialis.   CKI: stage III, we will try resuming ARB and monitor , no itching and has normal urine output    Patient Active Problem List   Diagnosis Date Noted  . Hyperlipidemia 11/02/2017  . 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 Erectile dysfunction 08/09/2016  . Vitamin D deficiency 08/09/2016  . Blurred vision, bilateral 04/27/2016  . 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 Benign hypertension with chronic kidney disease, stage III ([place]) 01/26/2016  . Chronic insomnia 01/26/2016  . Bilateral leg edema 01/26/2016  . History of colon cancer 06/10/2015    Past Surgical History:  Procedure Laterality Date  . COLON SURGERY  06/26/2007   right hemicolectomy-T2N0 carcinoma of right colon. The tumor was 2.6 cm,low grade with zero of 14  nodes positive. No evidence of venous or lymphatic invasion  . COLONOSCOPY  [id number]  . COLONOSCOPY WITH PROPOFOL N/A 08/04/2015   Procedure: COLONOSCOPY WITH PROPOFOL;  Surgeon: [name], MD;  Location: [facility] ENDOSCOPY;  Service: Endoscopy;  Laterality: N/A;    Family History  Problem Relation Age of Onset  . Cancer Brother        cancer of the thymus  . Cancer Maternal Aunt        breast cancer    Social History   Socioeconomic History  . Marital status: Divorced    Spouse name: Not on file  . Number of children: Not on file  . Years of education: Not on file  . Highest education level: Not on file  Occupational History  . Not on file  Social Needs  . Financial resource strain: Not on file  . Food insecurity:    Worry: Not on file    Inability: Not on file  . Transportation needs:    Medical: Not on file    Non-medical: Not on file  Tobacco Use  . Smoking status: Never Smoker  . Smokeless tobacco: Never Used  Substance and Sexual Activity  . Alcohol use: Yes    Alcohol/week: 0.6 - 1.2 oz    Types: 1 - 2 Standard drinks or equivalent per week    Comment: drinks rarely  .  Drug use: No  . Sexual activity: Yes    Birth control/protection: None  Lifestyle  . Physical activity:    Days per week: Not on file    Minutes per session: Not on file  . Stress: Not on file  Relationships  . Social connections:    Talks on phone: Not on file    Gets together: Not on file    Attends religious service: Not on file    Active member of club or organization: Not on file    Attends meetings of clubs or organizations: Not on file    Relationship status: Not on file  . Intimate partner violence:    Fear of current or ex partner: Not on file    Emotionally abused: Not on file    Physically abused: Not on file    Forced sexual activity: Not on file  Other Topics Concern  . Not on file  Social History Narrative  . Not on file     Current Outpatient Medications:  .   loratadine (CLARITIN) 10 MG tablet, Take 1 tablet (10 mg total) by mouth daily., Disp: 90 tablet, Rfl: 1 .  valACYclovir (VALTREX) 1000 MG tablet, Take 1 tablet (1,000 mg total) by mouth daily., Disp: 90 tablet, Rfl: 3 .  atorvastatin (LIPITOR) 40 MG tablet, Take 1 tablet (40 mg total) by mouth daily., Disp: 90 tablet, Rfl: 0 .  sildenafil (REVATIO) 20 MG tablet, Take 1-5 tablets (20-100 mg total) by mouth 3 (three) times daily., Disp: 50 tablet, Rfl: 0 .  traZODone (DESYREL) 50 MG tablet, Take 1.5 tablets (75 mg total) by mouth at bedtime as needed for sleep., Disp: 135 tablet, Rfl: 0 .  valsartan-hydrochlorothiazide (DIOVAN-HCT) 160-12.5 MG tablet, Take 1 tablet by mouth daily., Disp: 30 tablet, Rfl: 0  Allergies  Allergen Reactions  . Ace Inhibitors Swelling  . Ciprofloxacin     Tendon damage     ROS  Constitutional: Negative for fever or significant  weight change.  Respiratory: mild intermittent  Cough from post-nasal drainage, but no shortness of breath or orthopnea  Cardiovascular: Negative for chest pain or palpitations.  Gastrointestinal: Negative for abdominal pain, no bowel changes.  Musculoskeletal: Negative for gait problem but has bilateral ankle  joint swelling.  Skin: Negative for rash.  Neurological: Negative for dizziness or headache.  No other specific complaints in a complete review of systems (except as listed in HPI above).  Objective  Vitals:   02/05/18 0913  BP: (!) 156/78  Pulse: (!) 45  Resp: 14  Temp: 98 F (36.7 C)  TempSrc: Oral  SpO2: 98%  Weight: 204 lb 6.4 oz (92.7 kg)  Height: 5\' 11"  (1.803 m)    Body mass index is 28.51 kg/m.  Physical Exam  Constitutional: Patient appears well-developed and well-nourished.  No distress.  HEENT: head atraumatic, normocephalic, pupils equal and reactive to light, neck supple, throat within normal limits Cardiovascular: Normal rate, regular rhythm and normal heart sounds.  No murmur heard. 1 plus  BLE  edema. Pulmonary/Chest: Effort normal and breath sounds normal. No respiratory distress. Abdominal: Soft.  There is no tenderness. Psychiatric: Patient has a normal mood and affect. behavior is normal. Judgment and thought content normal.  PHQ2/9: Depression screen [facility] 2/9 02/05/2018 05/29/2017 05/29/2017 03/16/2017 03/07/2017  Decreased Interest 0 0 0 0 0  Down, Depressed, Hopeless 0 0 1 0 0  PHQ - 2 Score 0 0 1 0 0  Altered sleeping 1 - - - -  Tired, decreased energy 1 - - - -  Change in appetite 0 - - - -  Feeling bad or failure about yourself  0 - - - -  Trouble concentrating 0 - - - -  Moving slowly or fidgety/restless 0 - - - -  Suicidal thoughts 0 - - - -  PHQ-9 Score 2 - - - -  Difficult doing work/chores Not difficult at all - - - -     Fall Risk: Fall Risk  02/05/2018 05/29/2017 03/16/2017 03/07/2017 12/08/2016  Falls in the past year? No No No No No    Functional Status Survey: Is the patient deaf or have difficulty hearing?: No Does the patient have difficulty seeing, even when wearing glasses/contacts?: Yes(glasses) Does the patient have difficulty concentrating, remembering, or making decisions?: No Does the patient have difficulty walking or climbing stairs?: No Does the patient have difficulty dressing or bathing?: No Does the patient have difficulty doing errands alone such as visiting a doctor's office or shopping?: No    Assessment & Plan  1. Essential hypertension  He asked to go back on Quinapril, he states he has a history of angioedema from environmental allergies and even after coming off quinapril he has developed hives and angioedema that resolves with benadryl - valsartan-hydrochlorothiazide (DIOVAN-HCT) 160-12.5 MG tablet; Take 1 tablet by mouth daily.  Dispense: 30 tablet; Refill: 0  2. Chronic insomnia  Doing well on medication   3. Erectile dysfunction, unspecified erectile dysfunction type  - sildenafil (REVATIO) 20 MG tablet; Take 1-5 tablets  (20-100 mg total) by mouth 3 (three) times daily.  Dispense: 50 tablet; Refill: 0  4. Herpes simplex infection of penis  No recent episodes  5. Pure hypercholesterolemia  Discussed ASCVD of 13.8%, he never took statin, but willing to take it now - atorvastatin (LIPITOR) 40 MG tablet; Take 1 tablet (40 mg total) by mouth daily.  Dispense: 90 tablet; Refill: 0  6. History of angioedema  Off ACE, but willing to try ARB, he states always had angioedema even after stopped ACE, also states bystolic causes swelling of legs and is too expensive.   7. Bradycardia with 41-50 beats per minute  We are stopping bystolic  9. Perennial allergic rhinitis with seasonal variation  Continue Loratadine   9. Stage 3 chronic kidney disease ([place])  Recheck labs next visit adding ARB

## 2018-02-28 ENCOUNTER — Other Ambulatory Visit: Payer: Self-pay | Admitting: Family Medicine

## 2018-02-28 DIAGNOSIS — I129 Hypertensive chronic kidney disease with stage 1 through stage 4 chronic kidney disease, or unspecified chronic kidney disease: Secondary | ICD-10-CM

## 2018-02-28 DIAGNOSIS — N183 Chronic kidney disease, stage 3 (moderate): Principal | ICD-10-CM

## 2018-03-01 NOTE — Telephone Encounter (Signed)
Refill request for Hypertension medication:  Valsartan-HCTZ 160-12.5 mg  Last office visit pertaining to hypertension: 02/05/2018  BP Readings from Last 3 Encounters:  02/05/18 (!) 170/90  11/01/17 120/82  05/29/17 136/82    Lab Results  Component Value Date   CREATININE 1.51 (H) 11/01/2017   BUN 17 11/01/2017   NA 142 11/01/2017   K 4.2 11/01/2017   CL 105 11/01/2017   CO2 29 11/01/2017   Follow-ups on file. 03/19/2018

## 2018-03-19 ENCOUNTER — Ambulatory Visit: Payer: Medicare Other | Admitting: Family Medicine

## 2018-03-28 ENCOUNTER — Other Ambulatory Visit: Payer: Self-pay | Admitting: Family Medicine

## 2018-03-28 DIAGNOSIS — N183 Chronic kidney disease, stage 3 unspecified: Secondary | ICD-10-CM

## 2018-03-28 DIAGNOSIS — I129 Hypertensive chronic kidney disease with stage 1 through stage 4 chronic kidney disease, or unspecified chronic kidney disease: Secondary | ICD-10-CM

## 2018-03-28 NOTE — Telephone Encounter (Signed)
Tried calling pt to schedule an appt, pt states he will call back next week to schedule

## 2018-03-28 NOTE — Telephone Encounter (Signed)
Pt needs follow up to obtain additional refills of BP medication - he was supposed to follow up in 1 month from his February 05, 2018 appointment but never did. Please schedule. Thanks!

## 2018-03-28 NOTE — Telephone Encounter (Signed)
Hypertension medication request: Diovan to [facility].   Last office visit pertaining to hypertension: 02/05/18   BP Readings from Last 3 Encounters:  02/05/18 (!) 170/90  11/01/17 120/82  05/29/17 136/82    Lab Results  Component Value Date   CREATININE 1.51 (H) 11/01/2017   BUN 17 11/01/2017   NA 142 11/01/2017   K 4.2 11/01/2017   CL 105 11/01/2017   CO2 29 11/01/2017     No follow-ups on file.

## 2018-04-04 ENCOUNTER — Other Ambulatory Visit: Payer: Self-pay | Admitting: Family Medicine

## 2018-04-04 DIAGNOSIS — N529 Male erectile dysfunction, unspecified: Secondary | ICD-10-CM

## 2018-04-04 DIAGNOSIS — J302 Other seasonal allergic rhinitis: Secondary | ICD-10-CM

## 2018-04-22 ENCOUNTER — Encounter: Payer: Self-pay | Admitting: Family Medicine

## 2018-04-22 ENCOUNTER — Ambulatory Visit (INDEPENDENT_AMBULATORY_CARE_PROVIDER_SITE_OTHER): Payer: Medicare Other | Admitting: Family Medicine

## 2018-04-22 ENCOUNTER — Other Ambulatory Visit: Payer: Self-pay | Admitting: Family Medicine

## 2018-04-22 VITALS — BP 128/72 | HR 59 | Temp 97.9°F | Resp 16 | Ht 71.0 in | Wt 199.7 lb

## 2018-04-22 DIAGNOSIS — N529 Male erectile dysfunction, unspecified: Secondary | ICD-10-CM

## 2018-04-22 DIAGNOSIS — F5104 Psychophysiologic insomnia: Secondary | ICD-10-CM | POA: Diagnosis not present

## 2018-04-22 DIAGNOSIS — I129 Hypertensive chronic kidney disease with stage 1 through stage 4 chronic kidney disease, or unspecified chronic kidney disease: Secondary | ICD-10-CM | POA: Diagnosis not present

## 2018-04-22 DIAGNOSIS — E7800 Pure hypercholesterolemia, unspecified: Secondary | ICD-10-CM | POA: Diagnosis not present

## 2018-04-22 DIAGNOSIS — N183 Chronic kidney disease, stage 3 unspecified: Secondary | ICD-10-CM

## 2018-04-22 MED ORDER — VALSARTAN-HYDROCHLOROTHIAZIDE 160-12.5 MG PO TABS: 1.0000 | ORAL_TABLET | Freq: Every day | ORAL | 0 refills | Status: DC

## 2018-04-22 MED ORDER — TRAZODONE HCL 50 MG PO TABS: 75.0000 mg | ORAL_TABLET | Freq: Every evening | ORAL | 0 refills | Status: DC | PRN

## 2018-04-22 MED ORDER — SILDENAFIL CITRATE 20 MG PO TABS: 80.0000 mg | ORAL_TABLET | Freq: Three times a day (TID) | ORAL | 2 refills | Status: DC

## 2018-04-22 NOTE — Progress Notes (Signed)
Name: Frederick Nelson   MRN: [id number]    DOB: October 18, 1950   Date:04/22/2018       Progress Note  Subjective  Chief Complaint  Chief Complaint  Patient presents with  . Medication Refill  . Hypertension    medication changes    HPI  Insomnia: He has struggled with insomnia for many years - has tried several different medications in the past.  Restarted Trazodone in January 2019 to see if this would help, and he feels like his current dose is working. Averaging about 6 hours of sleep a night.  Has blackout curtains on windows, does not have any screen time before bed.  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  Hyperlipidemia: Current Medication Regimen: Is prescribed lipitor but is afraid to take it.  We will recheck lipids at next visit. - Current Diet: Has eliminated bacon and is trying to lower the fat content of his diet. Is taking fish oil or flaxseed. He is not exercising but is very active at his job. - Denies: Chest pain, shortness of breath, myalgias. - Documented aortic atherosclerosis? No - Risk factors for atherosclerosis: hypercholesterolemia and hypertension   HTN:  -does take medications as prescribed - current regimen includes Diovan 160-12.5.  - taking medications as instructed, no medication side effects noted, no TIAs, no chest pain on exertion, no dyspnea on exertion, no swelling of ankles, no orthopnea or paroxysmal nocturnal dyspnea - DASH diet discussed - pt does follow a low sodium diet; salt not added to cooking and salt shaker not on table; does eat out a lot (most lunches and dinners) and this does add some extra sodium to his diet. - The following  are not contributing factors: stress, sedentary lifestyle, corticosteroid use, saturated fats in diet, sweets including black licorice, smoking, sleep apnea, decongestant use. - 120/70's at home readings; today he is at goal.  CKI -  stable at last check; taking ARB.  Does not see nephrology at this time.  Obesity: Body mass index is 27.85 kg/m. Diet: frequent dining out Exercise: moderately active Co-Morbid Conditions: dyslipidemias and hypertension - Discussed decreasing eating out and increasing fresh vegetable/lean protein intake.  Patient Active Problem List   Diagnosis Date Noted  . Hyperlipidemia 11/02/2017  . 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 Erectile dysfunction 08/09/2016  . Vitamin D deficiency 08/09/2016  . Blurred vision, bilateral 04/27/2016  . 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 Benign hypertension with chronic kidney disease, stage III ([place]) 01/26/2016  . Chronic insomnia 01/26/2016  . Bilateral leg edema 01/26/2016  . History of colon cancer 06/10/2015    Past Surgical History:  Procedure Laterality Date  . COLON SURGERY  06/26/2007   right hemicolectomy-T2N0 carcinoma of right colon. The tumor was 2.6 cm,low grade with zero of 14 nodes positive. No evidence of venous or lymphatic invasion  . COLONOSCOPY  [id number]  . COLONOSCOPY WITH PROPOFOL N/A 08/04/2015   Procedure: COLONOSCOPY WITH PROPOFOL;  Surgeon: [name], MD;  Location[facility];  Service: Endoscopy;  Laterality: N/A;    Family History  Problem Relation Age of Onset  . Cancer Brother        cancer of the thymus  . Cancer Maternal Aunt        breast cancer    Social History   Socioeconomic History  . Marital status: Divorced    Spouse  name: Not on file  . Number of children: Not on file  . Years of education: Not on file  . Highest education level: Not on file  Occupational History  . Not on file  Social Needs  . Financial resource strain: Not on file  . Food insecurity:    Worry: Not on file    Inability: Not on file  . Transportation needs:    Medical: Not on file    Non-medical: Not on file  Tobacco Use  . Smoking status: Never Smoker  . Smokeless tobacco:  Never Used  Substance and Sexual Activity  . Alcohol use: Yes    Alcohol/week: 1.0 - 2.0 standard drinks    Types: 1 - 2 Standard drinks or equivalent per week    Comment: drinks rarely  . Drug use: No  . Sexual activity: Yes    Birth control/protection: None  Lifestyle  . Physical activity:    Days per week: Not on file    Minutes per session: Not on file  . Stress: Not on file  Relationships  . Social connections:    Talks on phone: Not on file    Gets together: Not on file    Attends religious service: Not on file    Active member of club or organization: Not on file    Attends meetings of clubs or organizations: Not on file    Relationship status: Not on file  . Intimate partner violence:    Fear of current or ex partner: Not on file    Emotionally abused: Not on file    Physically abused: Not on file    Forced sexual activity: Not on file  Other Topics Concern  . Not on file  Social History Narrative  . Not on file    Current Outpatient Medications:  .  loratadine (CLARITIN) 10 MG tablet, TAKE 1 TABLET BY MOUTH EVERY DAY, Disp: 90 tablet, Rfl: 1 .  sildenafil (REVATIO) 20 MG tablet, Take 1-5 tablets (20-100 mg total) by mouth 3 (three) times daily., Disp: 50 tablet, Rfl: 0 .  valACYclovir (VALTREX) 1000 MG tablet, Take 1 tablet (1,000 mg total) by mouth daily., Disp: 90 tablet, Rfl: 3 .  valsartan-hydrochlorothiazide (DIOVAN-HCT) 160-12.5 MG tablet, TAKE 1 TABLET BY MOUTH EVERY DAY, Disp: 30 tablet, Rfl: 0 .  atorvastatin (LIPITOR) 40 MG tablet, Take 1 tablet (40 mg total) by mouth daily. (Patient not taking: Reported on 04/22/2018), Disp: 90 tablet, Rfl: 0 .  traZODone (DESYREL) 50 MG tablet, Take 1.5 tablets (75 mg total) by mouth at bedtime as needed for sleep., Disp: 135 tablet, Rfl: 0  Allergies  Allergen Reactions  . Ace Inhibitors Swelling  . Ciprofloxacin     Tendon damage    ROS Constitutional: Negative for fever or weight change.  Respiratory: Negative  for cough and shortness of breath.   Cardiovascular: Negative for chest pain or palpitations.  Gastrointestinal: Negative for abdominal pain, no bowel changes.  Musculoskeletal: Negative for gait problem or joint swelling.  Skin: Negative for rash.  Neurological: Negative for dizziness or headache.  No other specific complaints in a complete review of systems (except as listed in HPI above).  Objective  Vitals:   04/22/18 0908  BP: 128/72  Pulse: (!) 59  Resp: 16  Temp: 97.9 F (36.6 C)  TempSrc: Oral  SpO2: 98%  Weight: 199 lb 11.2 oz (90.6 kg)  Height: 5\' 11"  (1.803 m)   Body mass index is 27.85 kg/m.  Physical  Exam Constitutional: Patient appears well-developed and well-nourished. No distress.  HENT: Head: Normocephalic and atraumatic. Oropharynx is clear and moist. No oropharyngeal exudate.  Eyes: Conjunctivae and EOM are normal. Pupils are equal, round, and reactive to light. No scleral icterus.  Neck: Normal range of motion. Neck supple. No JVD present. No thyromegaly present.  Cardiovascular: Normal rate, regular rhythm and normal heart sounds.  No murmur heard. No BLE edema. Pulmonary/Chest: Effort normal and breath sounds normal. No respiratory distress. Musculoskeletal: Normal range of motion, no joint effusions. No gross deformities Neurological: he is alert and oriented to person, place, and time. No cranial nerve deficit. Coordination, balance, strength, speech and gait are normal.  Skin: Skin is warm and dry. No rash noted. No erythema.  Psychiatric: Patient has a normal mood and affect. behavior is normal. Judgment and thought content normal.  No results found for this or any previous visit (from the past 72 hour(s)).  PHQ2/9: Depression screen [facility] 2/9 04/22/2018 02/05/2018 05/29/2017 05/29/2017 03/16/2017  Decreased Interest 0 0 0 0 0  Down, Depressed, Hopeless 0 0 0 1 0  PHQ - 2 Score 0 0 0 1 0  Altered sleeping 0 1 - - -  Tired, decreased energy 0 1 - - -    Change in appetite 0 0 - - -  Feeling bad or failure about yourself  0 0 - - -  Trouble concentrating 0 0 - - -  Moving slowly or fidgety/restless 0 0 - - -  Suicidal thoughts 0 0 - - -  PHQ-9 Score 0 2 - - -  Difficult doing work/chores Not difficult at all Not difficult at all - - -   Fall Risk: Fall Risk  04/22/2018 02/05/2018 05/29/2017 03/16/2017 03/07/2017  Falls in the past year? No No No No No    Assessment & Plan  1. Benign hypertension with chronic kidney disease, stage III (HCC) - DASH diet discussed - valsartan-hydrochlorothiazide (DIOVAN-HCT) 160-12.5 MG tablet; Take 1 tablet by mouth daily.  Dispense: 90 tablet; Refill: 0  2. Chronic insomnia - traZODone (DESYREL) 50 MG tablet; Take 1.5 tablets (75 mg total) by mouth at bedtime as needed for sleep.  Dispense: 135 tablet; Refill: 0 - Improved since last visit; maintain at current dose  3. Erectile dysfunction, unspecified erectile dysfunction type - sildenafil (REVATIO) 20 MG tablet; Take 4-5 tablets (80-100 mg total) by mouth 3 (three) times daily.  Dispense: 60 tablet; Refill: 2  4. Pure hypercholesterolemia - Lifestyle management discussed in detail. - He feels very strongly that he does not want to take statin medication at this time. Advised to work on lifestyle management - Discussed importance of 150 minutes of physical activity weekly, eat two servings of fish weekly, eat one serving of tree nuts ( cashews, pistachios, pecans, almonds.Marland Kitchen) every other day, eat 6 servings of fruit/vegetables daily and drink plenty of water.

## 2018-04-22 NOTE — Patient Instructions (Signed)
DASH Eating Plan DASH stands for "Dietary Approaches to Stop Hypertension." The DASH eating plan is a healthy eating plan that has been shown to reduce high blood pressure (hypertension). It may also reduce your risk for type 2 diabetes, heart disease, and stroke. The DASH eating plan may also help with weight loss. What are tips for following this plan? General guidelines  Avoid eating more than 2,300 mg (milligrams) of salt (sodium) a day. If you have hypertension, you may need to reduce your sodium intake to 1,500 mg a day.  Limit alcohol intake to no more than 1 drink a day for nonpregnant women and 2 drinks a day for men. One drink equals 12 oz of beer, 5 oz of wine, or 1 oz of hard liquor.  Work with your health care provider to maintain a healthy body weight or to lose weight. Ask what an ideal weight is for you.  Get at least 30 minutes of exercise that causes your heart to beat faster (aerobic exercise) most days of the week. Activities may include walking, swimming, or biking.  Work with your health care provider or diet and nutrition specialist (dietitian) to adjust your eating plan to your individual calorie needs. Reading food labels  Check food labels for the amount of sodium per serving. Choose foods with less than 5 percent of the Daily Value of sodium. Generally, foods with less than 300 mg of sodium per serving fit into this eating plan.  To find whole grains, look for the word "whole" as the first word in the ingredient list. Shopping  Buy products labeled as "low-sodium" or "no salt added."  Buy fresh foods. Avoid canned foods and premade or frozen meals. Cooking  Avoid adding salt when cooking. Use salt-free seasonings or herbs instead of table salt or sea salt. Check with your health care provider or pharmacist before using salt substitutes.  Do not fry foods. Cook foods using healthy methods such as baking, boiling, grilling, and broiling instead.  Cook with  heart-healthy oils, such as olive, canola, soybean, or sunflower oil. Meal planning   Eat a balanced diet that includes: ? 5 or more servings of fruits and vegetables each day. At each meal, try to fill half of your plate with fruits and vegetables. ? Up to 6-8 servings of whole grains each day. ? Less than 6 oz of lean meat, poultry, or fish each day. A 3-oz serving of meat is about the same size as a deck of cards. One egg equals 1 oz. ? 2 servings of low-fat dairy each day. ? A serving of nuts, seeds, or beans 5 times each week. ? Heart-healthy fats. Healthy fats called Omega-3 fatty acids are found in foods such as flaxseeds and coldwater fish, like sardines, salmon, and mackerel.  Limit how much you eat of the following: ? Canned or prepackaged foods. ? Food that is high in trans fat, such as fried foods. ? Food that is high in saturated fat, such as fatty meat. ? Sweets, desserts, sugary drinks, and other foods with added sugar. ? Full-fat dairy products.  Do not salt foods before eating.  Try to eat at least 2 vegetarian meals each week.  Eat more home-cooked food and less restaurant, buffet, and fast food.  When eating at a restaurant, ask that your food be prepared with less salt or no salt, if possible. What foods are recommended? The items listed may not be a complete list. Talk with your dietitian about what   dietary choices are best for you. Grains Whole-grain or whole-wheat bread. Whole-grain or whole-wheat pasta. Brown rice. Oatmeal. Quinoa. Bulgur. Whole-grain and low-sodium cereals. Pita bread. Low-fat, low-sodium crackers. Whole-wheat flour tortillas. Vegetables Fresh or frozen vegetables (raw, steamed, roasted, or grilled). Low-sodium or reduced-sodium tomato and vegetable juice. Low-sodium or reduced-sodium tomato sauce and tomato paste. Low-sodium or reduced-sodium canned vegetables. Fruits All fresh, dried, or frozen fruit. Canned fruit in natural juice (without  added sugar). Meat and other protein foods Skinless chicken or turkey. Ground chicken or turkey. Pork with fat trimmed off. Fish and seafood. Egg whites. Dried beans, peas, or lentils. Unsalted nuts, nut butters, and seeds. Unsalted canned beans. Lean cuts of beef with fat trimmed off. Low-sodium, lean deli meat. Dairy Low-fat (1%) or fat-free (skim) milk. Fat-free, low-fat, or reduced-fat cheeses. Nonfat, low-sodium ricotta or cottage cheese. Low-fat or nonfat yogurt. Low-fat, low-sodium cheese. Fats and oils Soft margarine without trans fats. Vegetable oil. Low-fat, reduced-fat, or light mayonnaise and salad dressings (reduced-sodium). Canola, safflower, olive, soybean, and sunflower oils. Avocado. Seasoning and other foods Herbs. Spices. Seasoning mixes without salt. Unsalted popcorn and pretzels. Fat-free sweets. What foods are not recommended? The items listed may not be a complete list. Talk with your dietitian about what dietary choices are best for you. Grains Baked goods made with fat, such as croissants, muffins, or some breads. Dry pasta or rice meal packs. Vegetables Creamed or fried vegetables. Vegetables in a cheese sauce. Regular canned vegetables (not low-sodium or reduced-sodium). Regular canned tomato sauce and paste (not low-sodium or reduced-sodium). Regular tomato and vegetable juice (not low-sodium or reduced-sodium). Pickles. Olives. Fruits Canned fruit in a light or heavy syrup. Fried fruit. Fruit in cream or butter sauce. Meat and other protein foods Fatty cuts of meat. Ribs. Fried meat. Bacon. Sausage. Bologna and other processed lunch meats. Salami. Fatback. Hotdogs. Bratwurst. Salted nuts and seeds. Canned beans with added salt. Canned or smoked fish. Whole eggs or egg yolks. Chicken or turkey with skin. Dairy Whole or 2% milk, cream, and half-and-half. Whole or full-fat cream cheese. Whole-fat or sweetened yogurt. Full-fat cheese. Nondairy creamers. Whipped toppings.  Processed cheese and cheese spreads. Fats and oils Butter. Stick margarine. Lard. Shortening. Ghee. Bacon fat. Tropical oils, such as coconut, palm kernel, or palm oil. Seasoning and other foods Salted popcorn and pretzels. Onion salt, garlic salt, seasoned salt, table salt, and sea salt. Worcestershire sauce. Tartar sauce. Barbecue sauce. Teriyaki sauce. Soy sauce, including reduced-sodium. Steak sauce. Canned and packaged gravies. Fish sauce. Oyster sauce. Cocktail sauce. Horseradish that you find on the shelf. Ketchup. Mustard. Meat flavorings and tenderizers. Bouillon cubes. Hot sauce and Tabasco sauce. Premade or packaged marinades. Premade or packaged taco seasonings. Relishes. Regular salad dressings. Where to find more information:  National Heart, Lung, and Blood Institute: www.nhlbi.nih.gov  American Heart Association: www.heart.org Summary  The DASH eating plan is a healthy eating plan that has been shown to reduce high blood pressure (hypertension). It may also reduce your risk for type 2 diabetes, heart disease, and stroke.  With the DASH eating plan, you should limit salt (sodium) intake to 2,300 mg a day. If you have hypertension, you may need to reduce your sodium intake to 1,500 mg a day.  When on the DASH eating plan, aim to eat more fresh fruits and vegetables, whole grains, lean proteins, low-fat dairy, and heart-healthy fats.  Work with your health care provider or diet and nutrition specialist (dietitian) to adjust your eating plan to your individual   calorie needs. This information is not intended to replace advice given to you by your health care provider. Make sure you discuss any questions you have with your health care provider. Document Released: 08/03/2011 Document Revised: 08/07/2016 Document Reviewed: 08/07/2016 Elsevier Interactive Patient Education  2018 [employer] Inc.  

## 2018-05-02 ENCOUNTER — Other Ambulatory Visit: Payer: Self-pay | Admitting: Family Medicine

## 2018-05-02 DIAGNOSIS — E7800 Pure hypercholesterolemia, unspecified: Secondary | ICD-10-CM

## 2018-05-02 NOTE — Telephone Encounter (Signed)
Patient told [name] he stopped taking medication, please verify before we send a refill.

## 2018-06-10 ENCOUNTER — Telehealth: Payer: Self-pay | Admitting: Family Medicine

## 2018-06-10 NOTE — Telephone Encounter (Signed)
Pt wanted [name] to know that his BP meds, Valsartan is causing aches and muscle weakness. Pt has take himself off of this medication as of 06/09/2018. Please advise.

## 2018-06-11 NOTE — Telephone Encounter (Signed)
Please advise 

## 2018-06-11 NOTE — Telephone Encounter (Signed)
If he feels this is the case, I would like to have him come in for a follow up - to check his BP, and possible check some labs since this is a change - we need to check his electrolytes.

## 2018-06-12 NOTE — Telephone Encounter (Signed)
Please schedule appointment.

## 2018-06-12 NOTE — Telephone Encounter (Signed)
appt scheduled for 10.17.19

## 2018-06-13 ENCOUNTER — Ambulatory Visit (INDEPENDENT_AMBULATORY_CARE_PROVIDER_SITE_OTHER): Payer: Medicare Other | Admitting: Family Medicine

## 2018-06-13 ENCOUNTER — Encounter: Payer: Self-pay | Admitting: Family Medicine

## 2018-06-13 VITALS — BP 120/70 | HR 57 | Temp 97.9°F | Resp 16 | Ht 71.0 in | Wt 204.3 lb

## 2018-06-13 DIAGNOSIS — R531 Weakness: Secondary | ICD-10-CM | POA: Diagnosis not present

## 2018-06-13 DIAGNOSIS — R5383 Other fatigue: Secondary | ICD-10-CM

## 2018-06-13 DIAGNOSIS — R001 Bradycardia, unspecified: Secondary | ICD-10-CM | POA: Diagnosis not present

## 2018-06-13 DIAGNOSIS — K458 Other specified abdominal hernia without obstruction or gangrene: Secondary | ICD-10-CM | POA: Diagnosis not present

## 2018-06-13 NOTE — Patient Instructions (Signed)
Please call our office when/if you would like a referral to the general surgeon for the perineal lesion we discussed today.

## 2018-06-13 NOTE — Progress Notes (Signed)
Name: Frederick Nelson   MRN: [id number]    DOB: February 27, 1951   Date:06/13/2018       Progress Note  Subjective  Chief Complaint  Chief Complaint  Patient presents with  . Hypertension    muscle weakness from medication, has not taken in 6 days  . Hernia    HPI  Weakness/Fatigue: Pt presents with concern for 3 weeks of BUE feeling weak and a little weakness feeling in the BLE.  He thought this was being caused by his BP medication, so he stopped the medication. Checking BP at home - has been 120's/70's since going off of the medication.  When he was on his medication he was getting readings of 100's/60's.  He also endorses fatigue.  He has had ongoing bradycardia as well - we will check EKG today as there is not one on file.  Denies chest pain, shortness of breath, or palpitation.  Has been taking 1 sildenafil a day - recommend he stop this for the time being.  Hernia: He also notes concern for possible perineal hernia that started 06/06/2018.  He treated for hemorrhoids - has had these in the past and thought that's what was going on, so he used preparation H and it did not help. He reports a bulging on the right side of the perineal area. Endorses pain with certain movements; no fevers or chills, blood in stool or dark and tarry stools, frank hematuria, no changes in urinary frequency, or abdominal pain, no tenderness to the site, no drainage.  Patient Active Problem List   Diagnosis Date Noted  . Hyperlipidemia 11/02/2017  . 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 Erectile dysfunction 08/09/2016  . Vitamin D deficiency 08/09/2016  . Blurred vision, bilateral 04/27/2016  . 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 Benign hypertension with chronic kidney disease, stage III ([place]) 01/26/2016  . Chronic insomnia 01/26/2016  . Bilateral leg edema 01/26/2016  . History of colon cancer 06/10/2015    Past Surgical History:  Procedure Laterality Date  . COLON  SURGERY  06/26/2007   right hemicolectomy-T2N0 carcinoma of right colon. The tumor was 2.6 cm,low grade with zero of 14 nodes positive. No evidence of venous or lymphatic invasion  . COLONOSCOPY  [id number]  . COLONOSCOPY WITH PROPOFOL N/A 08/04/2015   Procedure: COLONOSCOPY WITH PROPOFOL;  Surgeon: [name], MD;  Location: [facility] ENDOSCOPY;  Service: Endoscopy;  Laterality: N/A;    Family History  Problem Relation Age of Onset  . Cancer Brother        cancer of the thymus  . Cancer Maternal Aunt        breast cancer    Social History   Socioeconomic History  . Marital status: Divorced    Spouse name: Not on file  . Number of children: Not on file  . Years of education: Not on file  . Highest education level: Not on file  Occupational History  . Not on file  Social Needs  . Financial resource strain: Not on file  . Food insecurity:    Worry: Not on file    Inability: Not on file  . Transportation needs:    Medical: Not on file    Non-medical: Not on file  Tobacco Use  . Smoking status: Never Smoker  . Smokeless tobacco: Never Used  Substance and Sexual Activity  . Alcohol use: Yes    Alcohol/week: 1.0 - 2.0 standard drinks    Types: 1 -  2 Standard drinks or equivalent per week    Comment: drinks rarely  . Drug use: No  . Sexual activity: Yes    Birth control/protection: None  Lifestyle  . Physical activity:    Days per week: Not on file    Minutes per session: Not on file  . Stress: Not on file  Relationships  . Social connections:    Talks on phone: Not on file    Gets together: Not on file    Attends religious service: Not on file    Active member of club or organization: Not on file    Attends meetings of clubs or organizations: Not on file    Relationship status: Not on file  . Intimate partner violence:    Fear of current or ex partner: Not on file    Emotionally abused: Not on file    Physically abused: Not on file    Forced sexual activity: Not  on file  Other Topics Concern  . Not on file  Social History Narrative  . Not on file     Current Outpatient Medications:  .  loratadine (CLARITIN) 10 MG tablet, TAKE 1 TABLET BY MOUTH EVERY DAY, Disp: 90 tablet, Rfl: 1 .  sildenafil (REVATIO) 20 MG tablet, Take 4-5 tablets (80-100 mg total) by mouth 3 (three) times daily., Disp: 60 tablet, Rfl: 2 .  traZODone (DESYREL) 50 MG tablet, Take 1.5 tablets (75 mg total) by mouth at bedtime as needed for sleep., Disp: 135 tablet, Rfl: 0 .  valACYclovir (VALTREX) 1000 MG tablet, Take 1 tablet (1,000 mg total) by mouth daily., Disp: 90 tablet, Rfl: 3 .  valsartan-hydrochlorothiazide (DIOVAN-HCT) 160-12.5 MG tablet, Take 1 tablet by mouth daily. (Patient not taking: Reported on 06/13/2018), Disp: 90 tablet, Rfl: 0  Allergies  Allergen Reactions  . Ace Inhibitors Swelling  . Ciprofloxacin     Tendon damage    I personally reviewed active problem list, medication list, allergies, lab results with the patient/caregiver today.   ROS Ten systems reviewed and is negative except as mentioned in HPI  Objective  Vitals:   06/13/18 0816  BP: 120/70  Pulse: (!) 57  Resp: 16  Temp: 97.9 F (36.6 C)  TempSrc: Oral  SpO2: 99%  Weight: 204 lb 4.8 oz (92.7 kg)  Height: 5\' 11"  (1.803 m)     Body mass index is 28.49 kg/m.  Physical Exam  Constitutional: Patient appears well-developed and well-nourished. No distress.  HENT: Head: Normocephalic and atraumatic. Eyes: Conjunctivae and EOM are normal. No scleral icterus.  Pupils are equal, round, and reactive to light.  Neck: Normal range of motion. Neck supple. No JVD present. No thyromegaly present.  Cardiovascular: Bradycardic rate, regular rhythm and normal heart sounds.  No murmur heard. No BLE edema. Pulmonary/Chest: Effort normal and breath sounds normal. No respiratory distress. Abdominal: Soft. Bowel sounds are normal, no distension. There is no tenderness. No masses. Rectal: RIGHT  perineum has small, soft, reducible mass to the right side.  Likely hernia; there is no drainage or tenderness. UTD on colonoscopy. Musculoskeletal: Normal range of motion, no joint effusions. No gross deformities Neurological: Pt is alert and oriented to person, place, and time. No cranial nerve deficit. Coordination, balance, strength, speech and gait are normal.  Skin: Skin is warm and dry. No rash noted. No erythema.  Psychiatric: Patient has a normal mood and affect. behavior is normal. Judgment and thought content normal.  No results found for this or any previous visit (  from the past 72 hour(s)).  PHQ2/9: Depression screen [facility] 2/9 06/13/2018 04/22/2018 02/05/2018 05/29/2017 05/29/2017  Decreased Interest 0 0 0 0 0  Down, Depressed, Hopeless 0 0 0 0 1  PHQ - 2 Score 0 0 0 0 1  Altered sleeping 0 0 1 - -  Tired, decreased energy 0 0 1 - -  Change in appetite 0 0 0 - -  Feeling bad or failure about yourself  0 0 0 - -  Trouble concentrating 0 0 0 - -  Moving slowly or fidgety/restless 0 0 0 - -  Suicidal thoughts 0 0 0 - -  PHQ-9 Score 0 0 2 - -  Difficult doing work/chores Not difficult at all Not difficult at all Not difficult at all - -   Fall Risk: Fall Risk  06/13/2018 04/22/2018 02/05/2018 05/29/2017 03/16/2017  Falls in the past year? No No No No No   Assessment & Plan  1. Weakness - COMPLETE METABOLIC PANEL WITH GFR - CBC w/Diff/Platelet - Magnesium - EKG 12-Lead - TSH - EKG 12-Lead - Ambulatory referral to Cardiology  2. Bradycardia -I advised to stop sildenafil for the time being.  Remain off of blood pressure medications.  Follow-up in 1 week for blood pressure check, cardiology referral per orders. - EKG 12-Lead -EKG: sinus bradycardia; I do not appreciate any AV block at this time.  However due to patient's symptoms and bradycardia I strongly recommend cardiology referral - TSH - Ambulatory referral to Cardiology  3. Fatigue, unspecified type - COMPLETE METABOLIC  PANEL WITH GFR - CBC w/Diff/Platelet - Magnesium - EKG 12-Lead - TSH - EKG 12-Lead - Ambulatory referral to Cardiology  4. Perineal floor weakness -Advised this appears to be a perineal hernia.  It is soft, reducible, no erythema, no exudate, and no tenderness.  Advised of any of the symptoms do occur he needs to follow-up immediately for referral.  He will monitor symptoms over the next 2 to 4 weeks and call back if he decides to proceed with general surgery referral.

## 2018-06-14 LAB — CBC WITH DIFFERENTIAL/PLATELET
BASOS ABS: 71 {cells}/uL (ref 0–200)
Basophils Relative: 1.5 %
Eosinophils Absolute: 169 cells/uL (ref 15–500)
Eosinophils Relative: 3.6 %
HCT: 43.8 % (ref 38.5–50.0)
Hemoglobin: 14.9 g/dL (ref 13.2–17.1)
Lymphs Abs: 2214 cells/uL (ref 850–3900)
MCH: 31 pg (ref 27.0–33.0)
MCHC: 34 g/dL (ref 32.0–36.0)
MCV: 91.1 fL (ref 80.0–100.0)
MPV: 10.8 fL (ref 7.5–12.5)
Monocytes Relative: 10.2 %
NEUTROS PCT: 37.6 %
Neutro Abs: 1767 cells/uL (ref 1500–7800)
PLATELETS: 166 10*3/uL (ref 140–400)
RBC: 4.81 10*6/uL (ref 4.20–5.80)
RDW: 12.4 % (ref 11.0–15.0)
TOTAL LYMPHOCYTE: 47.1 %
WBC: 4.7 10*3/uL (ref 3.8–10.8)
WBCMIX: 479 {cells}/uL (ref 200–950)

## 2018-06-14 LAB — COMPLETE METABOLIC PANEL WITH GFR
AG RATIO: 1.4 (calc) (ref 1.0–2.5)
ALKALINE PHOSPHATASE (APISO): 60 U/L (ref 40–115)
ALT: 26 U/L (ref 9–46)
AST: 23 U/L (ref 10–35)
Albumin: 4.2 g/dL (ref 3.6–5.1)
BUN/Creatinine Ratio: 14 (calc) (ref 6–22)
BUN: 23 mg/dL (ref 7–25)
CALCIUM: 9.2 mg/dL (ref 8.6–10.3)
CO2: 28 mmol/L (ref 20–32)
CREATININE: 1.61 mg/dL — AB (ref 0.70–1.25)
Chloride: 108 mmol/L (ref 98–110)
GFR, EST NON AFRICAN AMERICAN: 44 mL/min/{1.73_m2} — AB (ref >=60)
GFR, Est African American: 51 mL/min/{1.73_m2} — ABNORMAL LOW (ref >=60)
Globulin: 2.9 g/dL (calc) (ref 1.9–3.7)
Glucose, Bld: 96 mg/dL (ref 65–99)
POTASSIUM: 4.5 mmol/L (ref 3.5–5.3)
Sodium: 143 mmol/L (ref 135–146)
Total Bilirubin: 0.5 mg/dL (ref 0.2–1.2)
Total Protein: 7.1 g/dL (ref 6.1–8.1)

## 2018-06-14 LAB — MAGNESIUM: MAGNESIUM: 2 mg/dL (ref 1.5–2.5)

## 2018-06-14 LAB — TSH: TSH: 1.82 mIU/L (ref 0.40–4.50)

## 2018-06-19 DIAGNOSIS — R001 Bradycardia, unspecified: Secondary | ICD-10-CM | POA: Insufficient documentation

## 2018-06-20 ENCOUNTER — Other Ambulatory Visit: Payer: Self-pay | Admitting: Family Medicine

## 2018-06-20 ENCOUNTER — Ambulatory Visit: Payer: Medicare Other

## 2018-06-20 VITALS — BP 142/78 | HR 61

## 2018-06-20 DIAGNOSIS — N183 Chronic kidney disease, stage 3 unspecified: Secondary | ICD-10-CM

## 2018-06-20 DIAGNOSIS — I129 Hypertensive chronic kidney disease with stage 1 through stage 4 chronic kidney disease, or unspecified chronic kidney disease: Secondary | ICD-10-CM

## 2018-06-20 MED ORDER — VALSARTAN-HYDROCHLOROTHIAZIDE 80-12.5 MG PO TABS: 1.0000 | ORAL_TABLET | Freq: Every day | ORAL | 0 refills | Status: DC

## 2018-06-20 NOTE — Progress Notes (Signed)
Patient is here for a blood pressure check. Patient denies chest pain, palpitations, shortness of breath or visual disturbances. At previous visit blood pressure was 120/70 with a heart rate of 57. Today during nurse visit first check blood pressure was 142/78 and Pulse 61. He does not take any blood pressure medications.  Last visit with Frederick Nelson on 06/13/2018: Stop Valsartan-HCTZ 160-12.5 mg due to muscle weakness in right arm.  BP has been running high since stopping medication at home with arm cuff-160/85. States it it lower right now due to taking Sildenafil last night.    States Ramipril 10 mg worked the best for him and he was on it for years. Does not believe the swelling came from that medicine-he had other allergies going on. Even after changing and stopping Ramipril his swelling was continued until he start taking Claritin dialy.   Patient refuses to take the lower dose of Valsartan HCTZ due to arm weakness and wants to see Dr. [name] to discuss this BP medication issue face to face.

## 2018-06-27 ENCOUNTER — Ambulatory Visit (INDEPENDENT_AMBULATORY_CARE_PROVIDER_SITE_OTHER): Payer: Medicare Other | Admitting: Family Medicine

## 2018-06-27 ENCOUNTER — Encounter: Payer: Self-pay | Admitting: Family Medicine

## 2018-06-27 VITALS — BP 128/82 | HR 78 | Temp 98.0°F | Resp 14 | Ht 71.0 in | Wt 200.2 lb

## 2018-06-27 DIAGNOSIS — M79601 Pain in right arm: Secondary | ICD-10-CM | POA: Diagnosis not present

## 2018-06-27 DIAGNOSIS — Z23 Encounter for immunization: Secondary | ICD-10-CM | POA: Diagnosis not present

## 2018-06-27 DIAGNOSIS — N183 Chronic kidney disease, stage 3 unspecified: Secondary | ICD-10-CM

## 2018-06-27 DIAGNOSIS — N529 Male erectile dysfunction, unspecified: Secondary | ICD-10-CM | POA: Diagnosis not present

## 2018-06-27 DIAGNOSIS — I10 Essential (primary) hypertension: Secondary | ICD-10-CM

## 2018-06-27 DIAGNOSIS — I129 Hypertensive chronic kidney disease with stage 1 through stage 4 chronic kidney disease, or unspecified chronic kidney disease: Secondary | ICD-10-CM

## 2018-06-27 MED ORDER — VALSARTAN-HYDROCHLOROTHIAZIDE 80-12.5 MG PO TABS: 1.0000 | ORAL_TABLET | Freq: Every day | ORAL | 0 refills | Status: DC

## 2018-06-27 MED ORDER — TADALAFIL 20 MG PO TABS: 10.0000 mg | ORAL_TABLET | ORAL | 0 refills | Status: DC | PRN

## 2018-06-27 NOTE — Progress Notes (Signed)
Name: Frederick Nelson   MRN: [id number]    DOB: 12-17-1950   Date:06/27/2018       Progress Note  Subjective  Chief Complaint  Chief Complaint  Patient presents with  . Follow-up  . Hypertension  . Medication Refill  . Muscle Pain    onset 2 months right arm and right leg    HPI  HTN: he is still struggling with his bp, he states his bp at home has been high in the 170's/80's at night, he did not bring his monitor with him today, bp in our office is at goal. No chest pain or palpitation, he saw Dr. Saralyn Pilar recently had a holter monitor and is scheduled for a stress test. He states sob only when going up flights of stairs.   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 No rashes, symptoms are not constant, he denies significant neck problems - states occasionally feels sore. No tingling or numbness on arm. Not taking any medications for that He is concerned that it could be because of viagra or new bp medication since started around the same time, explained unlikely to be that, but we will resume cialis and stop viagra.    Patient Active Problem List   Diagnosis Date Noted  . Sinus bradycardia 06/19/2018  . Hyperlipidemia 11/02/2017  . 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 Erectile dysfunction 08/09/2016  . Vitamin D deficiency 08/09/2016  . Blurred vision, bilateral 04/27/2016  . 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 Benign hypertension with chronic kidney disease, stage III ([place]) 01/26/2016  . Chronic insomnia 01/26/2016  . Bilateral leg edema 01/26/2016  . History of colon cancer 06/10/2015    Past Surgical History:  Procedure Laterality Date  . COLON SURGERY  06/26/2007   right hemicolectomy-T2N0  carcinoma of right colon. The tumor was 2.6 cm,low grade with zero of 14 nodes positive. No evidence of venous or lymphatic invasion  . COLONOSCOPY  [id number]  . COLONOSCOPY WITH PROPOFOL N/A 08/04/2015   Procedure: COLONOSCOPY WITH PROPOFOL;  Surgeon: [name], MD;  Location: [facility] ENDOSCOPY;  Service: Endoscopy;  Laterality: N/A;    Family History  Problem Relation Age of Onset  . Cancer Brother        cancer of the thymus  . Cancer Maternal Aunt        breast cancer    Social History   Socioeconomic History  . Marital status: Divorced    Spouse name: Not on file  . Number of children: Not on file  . Years of education: Not on file  . Highest education level: Not on file  Occupational History  . Not on file  Social Needs  . Financial resource strain: Not on file  . Food insecurity:    Worry: Not on file    Inability: Not on file  . Transportation needs:    Medical: Not on file    Non-medical: Not on file  Tobacco Use  . Smoking status: Never Smoker  . Smokeless tobacco: Never Used  Substance and Sexual Activity  . Alcohol use: Yes    Alcohol/week: 1.0 - 2.0 standard drinks    Types: 1 - 2 Standard drinks or equivalent per week  Comment: drinks rarely  . Drug use: No  . Sexual activity: Yes    Birth control/protection: None  Lifestyle  . Physical activity:    Days per week: Not on file    Minutes per session: Not on file  . Stress: Not on file  Relationships  . Social connections:    Talks on phone: Not on file    Gets together: Not on file    Attends religious service: Not on file    Active member of club or organization: Not on file    Attends meetings of clubs or organizations: Not on file    Relationship status: Not on file  . Intimate partner violence:    Fear of current or ex partner: Not on file    Emotionally abused: Not on file    Physically abused: Not on file    Forced sexual activity: Not on file  Other Topics Concern  . Not on file   Social History Narrative  . Not on file     Current Outpatient Medications:  .  loratadine (CLARITIN) 10 MG tablet, TAKE 1 TABLET BY MOUTH EVERY DAY, Disp: 90 tablet, Rfl: 1 .  sildenafil (REVATIO) 20 MG tablet, Take 4-5 tablets (80-100 mg total) by mouth 3 (three) times daily., Disp: 60 tablet, Rfl: 2 .  traZODone (DESYREL) 50 MG tablet, Take 1.5 tablets (75 mg total) by mouth at bedtime as needed for sleep., Disp: 135 tablet, Rfl: 0 .  valACYclovir (VALTREX) 1000 MG tablet, Take 1 tablet (1,000 mg total) by mouth daily., Disp: 90 tablet, Rfl: 3 .  valsartan-hydrochlorothiazide (DIOVAN HCT) 80-12.5 MG tablet, Take 1 tablet by mouth daily., Disp: 30 tablet, Rfl: 0  Allergies  Allergen Reactions  . Ace Inhibitors Swelling  . Ciprofloxacin     Tendon damage    I personally reviewed active problem list, medication list, allergies, family history, social history with the patient/caregiver today.   ROS  Constitutional: Negative for fever or weight change.  Respiratory: Negative for cough and shortness of breath.   Cardiovascular: Negative for chest pain or palpitations.  Gastrointestinal: Negative for abdominal pain, no bowel changes.  Musculoskeletal: Negative for gait problem or joint swelling.  Skin: Negative for rash.  Neurological: Negative for dizziness or headache.  No other specific complaints in a complete review of systems (except as listed in HPI above).  Objective  Vitals:   06/27/18 1037  BP: 128/82  Pulse: 78  Resp: 14  Temp: 98 F (36.7 C)  TempSrc: Oral  SpO2: 99%  Weight: 200 lb 3.2 oz (90.8 kg)  Height: 5\' 11"  (1.803 m)    Body mass index is 27.92 kg/m.  Physical Exam  Constitutional: Patient appears well-developed and well-nourished. Overweight. No distress.  HEENT: head atraumatic, normocephalic, pupils equal and reactive to light,  neck supple, throat within normal limits Cardiovascular: Normal rate, regular rhythm and normal heart sounds.  No  murmur heard. No BLE edema. Pulmonary/Chest: Effort normal and breath sounds normal. No respiratory distress. Abdominal: Soft.  There is no tenderness. Neurological: normal balance, alternate movements, right grip slightly weaker than left, normal tonus of both arms, cranial nerve intact. Psychiatric: Patient has a normal mood and affect. behavior is normal. Judgment and thought content normal.  Recent Results (from the past 2160 hour(s))  COMPLETE METABOLIC PANEL WITH GFR     Status: Abnormal   Collection Time: 06/13/18  9:01 AM  Result Value Ref Range   Glucose, Bld 96 65 - 99 mg/dL  Comment: .            Fasting reference interval .    BUN 23 7 - 25 mg/dL   Creat 1.61 (H) 0.70 - 1.25 mg/dL    Comment: For patients >75 years of age, the reference limit for Creatinine is approximately 13% higher for people identified as African-American. .    GFR, Est Non African American 44 (L) > OR = 60 mL/min/1.22m2   GFR, Est African American 51 (L) > OR = 60 mL/min/1.57m2   BUN/Creatinine Ratio 14 6 - 22 (calc)   Sodium 143 135 - 146 mmol/L   Potassium 4.5 3.5 - 5.3 mmol/L   Chloride 108 98 - 110 mmol/L   CO2 28 20 - 32 mmol/L   Calcium 9.2 8.6 - 10.3 mg/dL   Total Protein 7.1 6.1 - 8.1 g/dL   Albumin 4.2 3.6 - 5.1 g/dL   Globulin 2.9 1.9 - 3.7 g/dL (calc)   AG Ratio 1.4 1.0 - 2.5 (calc)   Total Bilirubin 0.5 0.2 - 1.2 mg/dL   Alkaline phosphatase (APISO) 60 40 - 115 U/L   AST 23 10 - 35 U/L   ALT 26 9 - 46 U/L  CBC w/Diff/Platelet     Status: None   Collection Time: 06/13/18  9:01 AM  Result Value Ref Range   WBC 4.7 3.8 - 10.8 Thousand/uL   RBC 4.81 4.20 - 5.80 Million/uL   Hemoglobin 14.9 13.2 - 17.1 g/dL   HCT 43.8 38.5 - 50.0 %   MCV 91.1 80.0 - 100.0 fL   MCH 31.0 27.0 - 33.0 pg   MCHC 34.0 32.0 - 36.0 g/dL   RDW 12.4 11.0 - 15.0 %   Platelets 166 140 - 400 Thousand/uL   MPV 10.8 7.5 - 12.5 fL   Neutro Abs 1,767 1,500 - 7,800 cells/uL   Lymphs Abs 2,214 850 - 3,900  cells/uL   WBC mixed population 479 200 - 950 cells/uL   Eosinophils Absolute 169 15 - 500 cells/uL   Basophils Absolute 71 0 - 200 cells/uL   Neutrophils Relative % 37.6 %   Total Lymphocyte 47.1 %   Monocytes Relative 10.2 %   Eosinophils Relative 3.6 %   Basophils Relative 1.5 %  Magnesium     Status: None   Collection Time: 06/13/18  9:01 AM  Result Value Ref Range   Magnesium 2.0 1.5 - 2.5 mg/dL  TSH     Status: None   Collection Time: 06/13/18  9:01 AM  Result Value Ref Range   TSH 1.82 0.40 - 4.50 mIU/L    PHQ2/9: Depression screen [facility] 2/9 06/27/2018 06/13/2018 04/22/2018 02/05/2018 05/29/2017  Decreased Interest 0 0 0 0 0  Down, Depressed, Hopeless 0 0 0 0 0  PHQ - 2 Score 0 0 0 0 0  Altered sleeping 1 0 0 1 -  Tired, decreased energy 1 0 0 1 -  Change in appetite 1 0 0 0 -  Feeling bad or failure about yourself  0 0 0 0 -  Trouble concentrating 0 0 0 0 -  Moving slowly or fidgety/restless 0 0 0 0 -  Suicidal thoughts 0 0 0 0 -  PHQ-9 Score 3 0 0 2 -  Difficult doing work/chores Not difficult at all Not difficult at all Not difficult at all Not difficult at all -     Fall Risk: Fall Risk  06/27/2018 06/13/2018 04/22/2018 02/05/2018 05/29/2017  Falls in the past year? No  No No No No     Functional Status Survey: Is the patient deaf or have difficulty hearing?: Yes Does the patient have difficulty seeing, even when wearing glasses/contacts?: Yes Does the patient have difficulty concentrating, remembering, or making decisions?: No Does the patient have difficulty walking or climbing stairs?: No Does the patient have difficulty dressing or bathing?: No Does the patient have difficulty doing errands alone such as visiting a doctor's office or shopping?: No    Assessment & Plan  1. Stage 3 chronic kidney disease ([place])  Monitor for now   2. Arm pain, anterior, right  Offered medication or referral to ortho but he wants to hold off for now  3. Need for  influenza vaccination  - Flu vaccine HIGH DOSE PF (Fluzone High dose)  4. Erectile dysfunction, unspecified erectile dysfunction type  - tadalafil (ADCIRCA/CIALIS) 20 MG tablet; Take 0.5-1 tablets (10-20 mg total) by mouth every other day as needed for erectile dysfunction.  Dispense: 60 tablet; Refill: 0  5. Essential hypertension  - valsartan-hydrochlorothiazide (DIOVAN HCT) 80-12.5 MG tablet; Take 1 tablet by mouth daily.  Dispense: 90 tablet; Refill: 0  6. Benign hypertension with chronic kidney disease, stage III ([facility])  - valsartan-hydrochlorothiazide (DIOVAN HCT) 80-12.5 MG tablet; Take 1 tablet by mouth daily.  Dispense: 90 tablet; Refill: 0

## 2018-07-02 ENCOUNTER — Ambulatory Visit (INDEPENDENT_AMBULATORY_CARE_PROVIDER_SITE_OTHER): Payer: Medicare Other | Admitting: Family Medicine

## 2018-07-02 ENCOUNTER — Ambulatory Visit: Payer: Medicare Other | Admitting: Family Medicine

## 2018-07-02 ENCOUNTER — Encounter: Payer: Self-pay | Admitting: Family Medicine

## 2018-07-02 VITALS — BP 118/62 | HR 60 | Temp 97.6°F | Resp 12 | Ht 71.0 in | Wt 203.4 lb

## 2018-07-02 DIAGNOSIS — Z23 Encounter for immunization: Secondary | ICD-10-CM | POA: Diagnosis not present

## 2018-07-02 DIAGNOSIS — Z125 Encounter for screening for malignant neoplasm of prostate: Secondary | ICD-10-CM

## 2018-07-02 DIAGNOSIS — Z114 Encounter for screening for human immunodeficiency virus [HIV]: Secondary | ICD-10-CM

## 2018-07-02 DIAGNOSIS — Z0000 Encounter for general adult medical examination without abnormal findings: Secondary | ICD-10-CM

## 2018-07-02 DIAGNOSIS — R351 Nocturia: Secondary | ICD-10-CM

## 2018-07-02 NOTE — Patient Instructions (Signed)
Preventive Care 65 Years and Older, Male Preventive care refers to lifestyle choices and visits with your health care provider that can promote health and wellness. What does preventive care include?  A yearly physical exam. This is also called an annual well check.  Dental exams once or twice a year.  Routine eye exams. Ask your health care provider how often you should have your eyes checked.  Personal lifestyle choices, including: ? Daily care of your teeth and gums. ? Regular physical activity. ? Eating a healthy diet. ? Avoiding tobacco and drug use. ? Limiting alcohol use. ? Practicing safe sex. ? Taking low doses of aspirin every day. ? Taking vitamin and mineral supplements as recommended by your health care provider. What happens during an annual well check? The services and screenings done by your health care provider during your annual well check will depend on your age, overall health, lifestyle risk factors, and family history of disease. Counseling Your health care provider may ask you questions about your:  Alcohol use.  Tobacco use.  Drug use.  Emotional well-being.  Home and relationship well-being.  Sexual activity.  Eating habits.  History of falls.  Memory and ability to understand (cognition).  Work and work environment.  Screening You may have the following tests or measurements:  Height, weight, and BMI.  Blood pressure.  Lipid and cholesterol levels. These may be checked every 5 years, or more frequently if you are over 50 years old.  Skin check.  Lung cancer screening. You may have this screening every year starting at age 55 if you have a 30-pack-year history of smoking and currently smoke or have quit within the past 15 years.  Fecal occult blood test (FOBT) of the stool. You may have this test every year starting at age 50.  Flexible sigmoidoscopy or colonoscopy. You may have a sigmoidoscopy every 5 years or a colonoscopy every 10  years starting at age 50.  Prostate cancer screening. Recommendations will vary depending on your family history and other risks.  Hepatitis C blood test.  Hepatitis B blood test.  Sexually transmitted disease (STD) testing.  Diabetes screening. This is done by checking your blood sugar (glucose) after you have not eaten for a while (fasting). You may have this done every 1-3 years.  Abdominal aortic aneurysm (AAA) screening. You may need this if you are a current or former smoker.  Osteoporosis. You may be screened starting at age 70 if you are at high risk.  Talk with your health care provider about your test results, treatment options, and if necessary, the need for more tests. Vaccines Your health care provider may recommend certain vaccines, such as:  Influenza vaccine. This is recommended every year.  Tetanus, diphtheria, and acellular pertussis (Tdap, Td) vaccine. You may need a Td booster every 10 years.  Varicella vaccine. You may need this if you have not been vaccinated.  Zoster vaccine. You may need this after age 60.  Measles, mumps, and rubella (MMR) vaccine. You may need at least one dose of MMR if you were born in 1957 or later. You may also need a second dose.  Pneumococcal 13-valent conjugate (PCV13) vaccine. One dose is recommended after age 65.  Pneumococcal polysaccharide (PPSV23) vaccine. One dose is recommended after age 65.  Meningococcal vaccine. You may need this if you have certain conditions.  Hepatitis A vaccine. You may need this if you have certain conditions or if you travel or work in places where you   may be exposed to hepatitis A.  Hepatitis B vaccine. You may need this if you have certain conditions or if you travel or work in places where you may be exposed to hepatitis B.  Haemophilus influenzae type b (Hib) vaccine. You may need this if you have certain risk factors.  Talk to your health care provider about which screenings and vaccines  you need and how often you need them. This information is not intended to replace advice given to you by your health care provider. Make sure you discuss any questions you have with your health care provider. Document Released: 09/10/2015 Document Revised: 05/03/2016 Document Reviewed: 06/15/2015 Elsevier Interactive Patient Education  2018 [employer]  

## 2018-07-02 NOTE — Addendum Note (Signed)
Addended by: Hubbard Hartshorn on: 07/02/2018 09:17 AM   Modules accepted: Orders

## 2018-07-02 NOTE — Progress Notes (Signed)
Patient: Frederick Nelson, Male    DOB: [birth year], 67 y.o.   MRN: [id number]  Visit Date: 07/02/2018  Today's Provider: Hubbard Hartshorn, FNP   Chief Complaint  Patient presents with  . Medicare Wellness    Subjective:   Frederick Nelson is a 67 y.o. male who presents today for his Subsequent Annual Wellness Visit.  Patient/Caregiver input:  No concerns today.  HPI  Past Medical History:  Diagnosis Date  . Abdominal pain, right lower quadrant   . Arthritis   . Chronic low back pain   . Degenerative joint disease (DJD) of hip   . Genital herpes    Takes Valtrex for prevention  . Hypertension   . Malignant neoplasm of ascending colon (Frederick Nelson) 2011  . Special screening for malignant neoplasms, colon 2011    Past Surgical History:  Procedure Laterality Date  . COLON SURGERY  06/26/2007   right hemicolectomy-T2N0 carcinoma of right colon. The tumor was 2.6 cm,low grade with zero of 14 nodes positive. No evidence of venous or lymphatic invasion  . COLONOSCOPY  [id number]  . COLONOSCOPY WITH PROPOFOL N/A 08/04/2015   Procedure: COLONOSCOPY WITH PROPOFOL;  Surgeon: Frederick Bellow, MD;  Location[facility] ENDOSCOPY;  Service: Endoscopy;  Laterality: N/A;    Family History  Problem Relation Age of Onset  . Cancer Brother        cancer of the thymus  . Cancer Maternal Aunt        breast cancer    Social History   Socioeconomic History  . Marital status: Divorced    Spouse name: Not on file  . Number of children: 1  . Years of education: 49  . Highest education level: Some college, no degree  Occupational History  . Not on file  Social Needs  . Financial resource strain: Not hard at all  . Food insecurity:    Worry: Never true    Inability: Never true  . Transportation needs:    Medical: No    Non-medical: No  Tobacco Use  . Smoking status: Never Smoker  . Smokeless tobacco: Never Used  Substance and Sexual Activity  . Alcohol use: Yes    Alcohol/week: 1.0 - 2.0  standard drinks    Types: 1 - 2 Standard drinks or equivalent per week    Comment: drinks rarely  . Drug use: No  . Sexual activity: Yes    Birth control/protection: None  Lifestyle  . Physical activity:    Days per week: 0 days    Minutes per session: 0 min  . Stress: Not at all  Relationships  . Social connections:    Talks on phone: More than three times a week    Gets together: Twice a week    Attends religious service: 1 to 4 times per year    Active member of club or organization: Yes    Attends meetings of clubs or organizations: More than 4 times per year    Relationship status: Divorced  . Intimate partner violence:    Fear of current or ex partner: No    Emotionally abused: No    Physically abused: No    Forced sexual activity: No  Other Topics Concern  . Not on file  Social History Narrative  . Not on file    Outpatient Encounter Medications as of 07/02/2018  Medication Sig  . loratadine (CLARITIN) 10 MG tablet TAKE 1 TABLET BY MOUTH EVERY DAY  . tadalafil (ADCIRCA/CIALIS) 20 MG  tablet Take 0.5-1 tablets (10-20 mg total) by mouth every other day as needed for erectile dysfunction.  . traZODone (DESYREL) 50 MG tablet Take 1.5 tablets (75 mg total) by mouth at bedtime as needed for sleep.  . valACYclovir (VALTREX) 1000 MG tablet Take 1 tablet (1,000 mg total) by mouth daily.  . valsartan-hydrochlorothiazide (DIOVAN HCT) 80-12.5 MG tablet Take 1 tablet by mouth daily.   No facility-administered encounter medications on file as of 07/02/2018.     Allergies  Allergen Reactions  . Ace Inhibitors Swelling  . Ciprofloxacin     Tendon damage    Care Team Updated in EHR: Yes  Last Vision Exam: 2017 Wears corrective lenses: Yes - needs a new Medicare Supplement with vision care included. Last Dental Exam: [place] visit was 1 month ago Last Hearing Exam: Hasn't had one since retiring; does not feel he needs one at this time. Wears Hearing Aids:  No  Functional Ability / Safety Screening 1.  Was the timed Get Up and Go test longer than 30 seconds?  no 2.  Does the patient need help with the phone, transportation, shopping,      preparing meals, housework, laundry, medications, or managing money?  no 3.  Does the patient's home have:  loose throw rugs in the hallway?   no      Grab bars in the bathroom? yes      Handrails on the stairs?   yes      Poor lighting?   no 4.  Has the patient noticed any hearing difficulties?   no  Diet Recall and Exercise Regimen: Not exercising aside from work; varied and balanced diet - not particularly sodium-restricted because he eats out a lot.  Fall Risk: See screening under Objective Information  Depression Screen: See screening under Objective Information  Advanced Directives: A voluntary discussion about advance care planning including the explanation and discussion of advance directives was discussed with the patient. Explanation about the health care proxy and living will was reviewed.  During this discussion, the patient was able to identify a health care proxy as Brother Frederick Nelson)  and plans/does not plan to fill out the paperwork required and will bring this to our office to keep on file. Does patient have a HCPOA?    no If yes, name and contact information:  Does patient have a living will or MOST form?  no  Cancer Screenings: Lung: Low Dose CT Chest recommended if Age 53-80 years, 30 pack-year currently smoking OR have quit w/in 15years. Patient does not qualify.  Lifestyle risk factor issued reviewed: Diet, exercise, weight management, advised patient smoking is not healthy, nutrition/diet.    Prostate:  IPSS Questionnaire (AUA-7): Over the past month.   1)  How often have you had a sensation of not emptying your bladder completely after you finish urinating?  0 - Not at all  2)  How often have you had to urinate again less than two hours after you finished urinating? 5 -  Almost always  3)  How often have you found you stopped and started again several times when you urinated?  0 - Not at all  4) How difficult have you found it to postpone urination?  5 - Almost always  5) How often have you had a weak urinary stream?  2 - Less than half the time  6) How often have you had to push or strain to begin urination?  0 - Not at  all  7) How many times did you most typically get up to urinate from the time you went to bed until the time you got up in the morning?  1 - 1 time  Total score:  0-7 mildly symptomatic   8-19 moderately symptomatic   20-35 severely symptomatic  Score of 13, has been ongoing for many years at this point.  Last PSA was 05/29/2018.  Colorectal: UTD w/ Dr. Fleet Contras  Additional Screenings: Hepatitis B/HIV/Syphillis: Will check HIV today in labs. Hepatitis C Screening: Negative in 2018 Intimate Partner Violence: Safe at home. AAA Screen: Men age 54 to 4 years if ever smoked recommended to get a one time AAA ultrasound screening exam. Patient does not qualify.  Objective:   Vitals: BP 118/62   Pulse 60   Temp 97.6 F (36.4 C) (Oral)   Resp 12   Ht 5\' 11"  (1.803 m)   Wt 203 lb 6.4 oz (92.3 kg)   SpO2 99%   BMI 28.37 kg/m  Body mass index is 28.37 kg/m.  Lab Results  Component Value Date   CHOL 208 (H) 11/01/2017   CHOL 162 05/12/2016   CHOL 178 05/11/2016   Lab Results  Component Value Date   HDL 60 11/01/2017   HDL 63 05/12/2016   HDL 65 05/11/2016   Lab Results  Component Value Date   LDLCALC 129 (H) 11/01/2017   LDLCALC 80 05/12/2016   LDLCALC 93 05/11/2016   Lab Results  Component Value Date   TRIG 88 11/01/2017   TRIG 95 05/12/2016   TRIG 99 05/11/2016   Lab Results  Component Value Date   CHOLHDL 3.5 11/01/2017   CHOLHDL 2.6 05/12/2016   CHOLHDL 2.7 05/11/2016   No results found for: LDLDIRECT  No exam data present  Cognitive Testing - 6-CIT  Correct? Score   What year is it? yes 0 Yes = 0    No =  4  What month is it? yes 0 Yes = 0    No = 3  Remember:     Pia Mau, [place], [place]     What time is it? yes 0 Yes = 0    No = 3  Count backwards from 20 to 1 yes 0 Correct = 0    1 error = 2   More than 1 error = 4  Say the months of the year in reverse. yes 0 Correct = 0    1 error = 2   More than 1 error = 4  What address did I ask you to remember? yes 0 Correct = 0  1 error = 2    2 error = 4    3 error = 6    4 error = 8    All wrong = 10       TOTAL SCORE  0/28   Interpretation:  Normal  Normal (0-7) Abnormal (8-28)   Fall Risk: Fall Risk  07/02/2018 06/27/2018 06/13/2018 04/22/2018 02/05/2018  Falls in the past year? 0 No No No No  Number falls in past yr: 0 - - - -    Depression Screen Depression screen [facility] 2/9 07/02/2018 06/27/2018 06/13/2018 04/22/2018 02/05/2018  Decreased Interest 0 0 0 0 0  Down, Depressed, Hopeless 0 0 0 0 0  PHQ - 2 Score 0 0 0 0 0  Altered sleeping 0 1 0 0 1  Tired, decreased energy 0 1 0 0 1  Change in appetite  0 1 0 0 0  Feeling bad or failure about yourself  0 0 0 0 0  Trouble concentrating 0 0 0 0 0  Moving slowly or fidgety/restless 0 0 0 0 0  Suicidal thoughts 0 0 0 0 0  PHQ-9 Score 0 3 0 0 2  Difficult doing work/chores Not difficult at all Not difficult at all Not difficult at all Not difficult at all Not difficult at all    Recent Results (from the past 2160 hour(s))  COMPLETE METABOLIC PANEL WITH GFR     Status: Abnormal   Collection Time: 06/13/18  9:01 AM  Result Value Ref Range   Glucose, Bld 96 65 - 99 mg/dL    Comment: .            Fasting reference interval .    BUN 23 7 - 25 mg/dL   Creat 1.61 (H) 0.70 - 1.25 mg/dL    Comment: For patients >47 years of age, the reference limit for Creatinine is approximately 13% higher for people identified as African-American. .    GFR, Est Non African American 44 (L) > OR = 60 mL/min/1.38m2   GFR, Est African American 51 (L) > OR = 60 mL/min/1.28m2   BUN/Creatinine Ratio 14  6 - 22 (calc)   Sodium 143 135 - 146 mmol/L   Potassium 4.5 3.5 - 5.3 mmol/L   Chloride 108 98 - 110 mmol/L   CO2 28 20 - 32 mmol/L   Calcium 9.2 8.6 - 10.3 mg/dL   Total Protein 7.1 6.1 - 8.1 g/dL   Albumin 4.2 3.6 - 5.1 g/dL   Globulin 2.9 1.9 - 3.7 g/dL (calc)   AG Ratio 1.4 1.0 - 2.5 (calc)   Total Bilirubin 0.5 0.2 - 1.2 mg/dL   Alkaline phosphatase (APISO) 60 40 - 115 U/L   AST 23 10 - 35 U/L   ALT 26 9 - 46 U/L  CBC w/Diff/Platelet     Status: None   Collection Time: 06/13/18  9:01 AM  Result Value Ref Range   WBC 4.7 3.8 - 10.8 Thousand/uL   RBC 4.81 4.20 - 5.80 Million/uL   Hemoglobin 14.9 13.2 - 17.1 g/dL   HCT 43.8 38.5 - 50.0 %   MCV 91.1 80.0 - 100.0 fL   MCH 31.0 27.0 - 33.0 pg   MCHC 34.0 32.0 - 36.0 g/dL   RDW 12.4 11.0 - 15.0 %   Platelets 166 140 - 400 Thousand/uL   MPV 10.8 7.5 - 12.5 fL   Neutro Abs 1,767 1,500 - 7,800 cells/uL   Lymphs Abs 2,214 850 - 3,900 cells/uL   WBC mixed population 479 200 - 950 cells/uL   Eosinophils Absolute 169 15 - 500 cells/uL   Basophils Absolute 71 0 - 200 cells/uL   Neutrophils Relative % 37.6 %   Total Lymphocyte 47.1 %   Monocytes Relative 10.2 %   Eosinophils Relative 3.6 %   Basophils Relative 1.5 %  Magnesium     Status: None   Collection Time: 06/13/18  9:01 AM  Result Value Ref Range   Magnesium 2.0 1.5 - 2.5 mg/dL  TSH     Status: None   Collection Time: 06/13/18  9:01 AM  Result Value Ref Range   TSH 1.82 0.40 - 4.50 mIU/L   Assessment & Plan:     Exercise Activities and Dietary recommendations Goals   None    Discussed health benefits of physical activity, and encouraged him to engage  in regular exercise appropriate for his age and condition.   Immunization History  Administered Date(s) Administered  . Influenza, High Dose Seasonal PF 05/29/2017, 06/27/2018  . Influenza,inj,Quad PF,6+ Mos 06/18/2015, 05/11/2016  . Pneumococcal Conjugate-13 05/29/2017    Health Maintenance  Topic Date Due  .  PNA vac Low Risk Adult (2 of 2 - PPSV23) 05/29/2018  . COLONOSCOPY  08/03/2020  . TETANUS/TDAP  02/01/2023  . INFLUENZA VACCINE  Completed  . Hepatitis C Screening  Completed     No orders of the defined types were placed in this encounter.   Current Outpatient Medications:  .  loratadine (CLARITIN) 10 MG tablet, TAKE 1 TABLET BY MOUTH EVERY DAY, Disp: 90 tablet, Rfl: 1 .  tadalafil (ADCIRCA/CIALIS) 20 MG tablet, Take 0.5-1 tablets (10-20 mg total) by mouth every other day as needed for erectile dysfunction., Disp: 60 tablet, Rfl: 0 .  traZODone (DESYREL) 50 MG tablet, Take 1.5 tablets (75 mg total) by mouth at bedtime as needed for sleep., Disp: 135 tablet, Rfl: 0 .  valACYclovir (VALTREX) 1000 MG tablet, Take 1 tablet (1,000 mg total) by mouth daily., Disp: 90 tablet, Rfl: 3 .  valsartan-hydrochlorothiazide (DIOVAN HCT) 80-12.5 MG tablet, Take 1 tablet by mouth daily., Disp: 90 tablet, Rfl: 0 There are no discontinued medications.  I have personally reviewed and addressed the Medicare Annual Wellness health risk assessment questionnaire and have noted the following in the patient's chart:  A.         Medical and social history & family history B.         Use of alcohol, tobacco or illicit drugs  C.         Current medications and supplements D.         Functional and Cognitive ability and status E.         Nutritional status F.         Physical activity G.        Advance directives H.         List of other physicians I.          Hospitalizations, surgeries, and ER visits in previous 12 months J.         [place] such as hearing and vision if needed, cognitive and depression L.         Referrals and appointments - none today  In addition, I have reviewed and discussed with patient certain preventive protocols, quality metrics, and best practice recommendations. A written personalized care plan for preventive services as well as general preventive health  recommendations were provided to patient.  See attached scanned questionnaire for additional information.

## 2018-07-03 LAB — PSA: PSA: 0.9 ng/mL (ref <=4.0)

## 2018-07-03 LAB — HIV ANTIBODY (ROUTINE TESTING W REFLEX): HIV 1&2 Ab, 4th Generation: NONREACTIVE

## 2018-08-11 ENCOUNTER — Other Ambulatory Visit: Payer: Self-pay | Admitting: Family Medicine

## 2018-08-11 DIAGNOSIS — F5104 Psychophysiologic insomnia: Secondary | ICD-10-CM

## 2018-09-26 ENCOUNTER — Other Ambulatory Visit: Payer: Self-pay | Admitting: Nurse Practitioner

## 2018-09-26 ENCOUNTER — Encounter: Payer: Self-pay | Admitting: Family Medicine

## 2018-09-26 ENCOUNTER — Other Ambulatory Visit: Payer: Self-pay | Admitting: Family Medicine

## 2018-09-26 ENCOUNTER — Ambulatory Visit (INDEPENDENT_AMBULATORY_CARE_PROVIDER_SITE_OTHER): Payer: Medicare HMO | Admitting: Family Medicine

## 2018-09-26 VITALS — BP 120/70 | HR 60 | Temp 97.4°F | Resp 16 | Ht 71.0 in | Wt 209.6 lb

## 2018-09-26 DIAGNOSIS — I129 Hypertensive chronic kidney disease with stage 1 through stage 4 chronic kidney disease, or unspecified chronic kidney disease: Secondary | ICD-10-CM | POA: Diagnosis not present

## 2018-09-26 DIAGNOSIS — M79601 Pain in right arm: Secondary | ICD-10-CM

## 2018-09-26 DIAGNOSIS — I10 Essential (primary) hypertension: Secondary | ICD-10-CM

## 2018-09-26 DIAGNOSIS — M25552 Pain in left hip: Secondary | ICD-10-CM

## 2018-09-26 DIAGNOSIS — G8929 Other chronic pain: Secondary | ICD-10-CM | POA: Diagnosis not present

## 2018-09-26 DIAGNOSIS — N529 Male erectile dysfunction, unspecified: Secondary | ICD-10-CM | POA: Diagnosis not present

## 2018-09-26 DIAGNOSIS — A6002 Herpesviral infection of other male genital organs: Secondary | ICD-10-CM

## 2018-09-26 DIAGNOSIS — N183 Chronic kidney disease, stage 3 unspecified: Secondary | ICD-10-CM

## 2018-09-26 DIAGNOSIS — J302 Other seasonal allergic rhinitis: Secondary | ICD-10-CM

## 2018-09-26 DIAGNOSIS — R69 Illness, unspecified: Secondary | ICD-10-CM | POA: Diagnosis not present

## 2018-09-26 DIAGNOSIS — E7800 Pure hypercholesterolemia, unspecified: Secondary | ICD-10-CM

## 2018-09-26 DIAGNOSIS — M25551 Pain in right hip: Secondary | ICD-10-CM | POA: Diagnosis not present

## 2018-09-26 MED ORDER — VALSARTAN-HYDROCHLOROTHIAZIDE 80-12.5 MG PO TABS: 1.0000 | ORAL_TABLET | Freq: Every day | ORAL | 1 refills | Status: DC

## 2018-09-26 MED ORDER — VALACYCLOVIR HCL 1 G PO TABS: 1000.0000 mg | ORAL_TABLET | Freq: Every day | ORAL | 3 refills | Status: DC

## 2018-09-26 MED ORDER — TADALAFIL 20 MG PO TABS: 10.0000 mg | ORAL_TABLET | ORAL | 0 refills | Status: DC | PRN

## 2018-09-26 NOTE — Progress Notes (Signed)
Name: Frederick Nelson   MRN: [id number]    DOB: [birth year]   Date:09/26/2018       Progress Note  Subjective  Chief Complaint  Chief Complaint  Patient presents with  . Hypertension  . Hyperlipidemia  . Chronic Kidney Disease  . Hip Pain    bilateral hip pain. Would like referral for hip replacement  . Arm Pain    right arm pain comes from using the mouse on his computer.    HPI  HTN: bp has been at goal now, no chest pain or palpitation, no SOB, he is on low dose of valsartan/hctz 80/12.5   CKI stage 3: stable GFR, he needs to have labs rechecked , normal urine output. No pruritus   Right muscle pain: right arm, worked a lot in [employer] during  Summer 2019 , he also works at home using his mouse and has noticed over the past month pain on index finger that shoots up his right arm, no associated weakness but it causes pain when using his arm. He also states recently noticed some tingling on right index finger and hand  Bilateral hip pain: he has a long history of hip pain, he states worse on the outer hip pain, he also states when lifting heavy weight he notices low back pain. No bowel or bladder incontinence. Explained it may be secondary to back problems not hip, he would like to see dr. Harlow Mares   ED: taking viagra prn and needs a refill  Herpes genitalis: he is taking prophylactic medication and no recent episodes    Patient Active Problem List   Diagnosis Date Noted  . Sinus bradycardia 06/19/2018  . Hyperlipidemia 11/02/2017  . 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 Erectile dysfunction 08/09/2016  . Vitamin D deficiency 08/09/2016  . Blurred vision, bilateral 04/27/2016  . 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 Benign hypertension with chronic kidney disease, stage III ([place]) 01/26/2016  . Chronic insomnia 01/26/2016  . Bilateral leg edema 01/26/2016  . History of colon cancer 06/10/2015    Past Surgical History:  Procedure Laterality  Date  . COLON SURGERY  06/26/2007   right hemicolectomy-T2N0 carcinoma of right colon. The tumor was 2.6 cm,low grade with zero of 14 nodes positive. No evidence of venous or lymphatic invasion  . COLONOSCOPY  [id number]  . COLONOSCOPY WITH PROPOFOL N/A 08/04/2015   Procedure: COLONOSCOPY WITH PROPOFOL;  Surgeon: [name], MD;  Location: [facility] ENDOSCOPY;  Service: Endoscopy;  Laterality: N/A;    Family History  Problem Relation Age of Onset  . Cancer Brother        cancer of the thymus  . Cancer Maternal Aunt        breast cancer    Social History   Socioeconomic History  . Marital status: Divorced    Spouse name: Not on file  . Number of children: 1  . Years of education: 60  . Highest education level: Some college, no degree  Occupational History  . Not on file  Social Needs  . Financial resource strain: Not hard at all  . Food insecurity:    Worry: Never true    Inability: Never true  . Transportation needs:    Medical: No    Non-medical: No  Tobacco Use  . Smoking status: Never Smoker  . Smokeless tobacco: Never Used  Substance and Sexual Activity  . Alcohol use: Yes    Alcohol/week: 1.0 - 2.0 standard drinks  Types: 1 - 2 Standard drinks or equivalent per week    Comment: drinks rarely  . Drug use: No  . Sexual activity: Yes    Birth control/protection: None  Lifestyle  . Physical activity:    Days per week: 0 days    Minutes per session: 0 min  . Stress: Not at all  Relationships  . Social connections:    Talks on phone: More than three times a week    Gets together: Twice a week    Attends religious service: 1 to 4 times per year    Active member of club or organization: Yes    Attends meetings of clubs or organizations: More than 4 times per year    Relationship status: Divorced  . Intimate partner violence:    Fear of current or ex partner: No    Emotionally abused: No    Physically abused: No    Forced sexual activity: No  Other Topics  Concern  . Not on file  Social History Narrative  . Not on file     Current Outpatient Medications:  .  tadalafil (ADCIRCA/CIALIS) 20 MG tablet, Take 0.5-1 tablets (10-20 mg total) by mouth every other day as needed for erectile dysfunction., Disp: 60 tablet, Rfl: 0 .  traZODone (DESYREL) 50 MG tablet, TAKE 1.5 TABLETS (75 MG TOTAL) BY MOUTH AT BEDTIME AS NEEDED FOR SLEEP., Disp: 135 tablet, Rfl: 0 .  valACYclovir (VALTREX) 1000 MG tablet, Take 1 tablet (1,000 mg total) by mouth daily., Disp: 90 tablet, Rfl: 3 .  valsartan-hydrochlorothiazide (DIOVAN HCT) 80-12.5 MG tablet, Take 1 tablet by mouth daily., Disp: 90 tablet, Rfl: 0 .  loratadine (CLARITIN) 10 MG tablet, TAKE 1 TABLET BY MOUTH EVERY DAY, Disp: 90 tablet, Rfl: 1  Allergies  Allergen Reactions  . Ace Inhibitors Swelling  . Ciprofloxacin     Tendon damage    I personally reviewed active problem, past medical history , social history and family history, list with the patient/caregiver today.   ROS  Constitutional: Negative for fever or weight change.  Respiratory: Negative for cough and shortness of breath.   Cardiovascular: Negative for chest pain or palpitations.  Gastrointestinal: Negative for abdominal pain, no bowel changes.  Musculoskeletal: Negative for gait problem or joint swelling.  Skin: Negative for rash.  Neurological: Negative for dizziness or headache.  No other specific complaints in a complete review of systems (except as listed in HPI above).  Objective  Vitals:   09/26/18 1018  BP: 120/70  Pulse: 60  Resp: 16  Temp: (!) 97.4 F (36.3 C)  TempSrc: Oral  SpO2: 99%  Weight: 209 lb 9.6 oz (95.1 kg)  Height: 5\' 11"  (1.803 m)    Body mass index is 29.23 kg/m.  Physical Exam  Constitutional: Patient appears well-developed and well-nourished. Overweight.  No distress.  HEENT: head atraumatic, normocephalic, pupils equal and reactive to light,  neck supple, throat within normal  limits Cardiovascular: Normal rate, regular rhythm and normal heart sounds.  No murmur heard. No BLE edema. Pulmonary/Chest: Effort normal and breath sounds normal. No respiratory distress. Muscular Skeletal: no hand or arm atrophy, pain when he flexes his index finger on right hand.  Abdominal: Soft.  There is no tenderness. Psychiatric: Patient has a normal mood and affect. behavior is normal. Judgment and thought content normal.  Recent Results (from the past 2160 hour(s))  HIV Antibody (routine testing w rflx)     Status: None   Collection Time: 07/02/18  8:59 AM  Result Value Ref Range   HIV 1&2 Ab, 4th Generation NON-REACTIVE NON-REACTI    Comment: HIV-1 antigen and HIV-1/HIV-2 antibodies were not detected. There is no laboratory evidence of HIV infection. Marland Kitchen PLEASE NOTE: This information has been disclosed to you from records whose confidentiality may be protected by state law.  If your state requires such protection, then the state law prohibits you from making any further disclosure of the information without the specific written consent of the person to whom it pertains, or as otherwise permitted by law. A general authorization for the release of medical or other information is NOT sufficient for this purpose. . For additional information please refer to http://education.questdiagnostics.com/faq/FAQ106 (This link is being provided for informational/ educational purposes only.) . Marland Kitchen The performance of this assay has not been clinically validated in patients less than 21 years old. Marland Kitchen   PSA     Status: None   Collection Time: 07/02/18  8:59 AM  Result Value Ref Range   PSA 0.9 < OR = 4.0 ng/mL    Comment: The total PSA value from this assay system is  standardized against the WHO standard. The test  result will be approximately 20% lower when compared  to the equimolar-standardized total PSA (Beckman  Coulter). Comparison of serial PSA results should be  interpreted  with this fact in mind. . This test was performed using the Siemens  chemiluminescent method. Values obtained from  different assay methods cannot be used interchangeably. PSA levels, regardless of value, should not be interpreted as absolute evidence of the presence or absence of disease.       PHQ2/9: Depression screen [facility] 2/9 07/02/2018 06/27/2018 06/13/2018 04/22/2018 02/05/2018  Decreased Interest 0 0 0 0 0  Down, Depressed, Hopeless 0 0 0 0 0  PHQ - 2 Score 0 0 0 0 0  Altered sleeping 0 1 0 0 1  Tired, decreased energy 0 1 0 0 1  Change in appetite 0 1 0 0 0  Feeling bad or failure about yourself  0 0 0 0 0  Trouble concentrating 0 0 0 0 0  Moving slowly or fidgety/restless 0 0 0 0 0  Suicidal thoughts 0 0 0 0 0  PHQ-9 Score 0 3 0 0 2  Difficult doing work/chores Not difficult at all Not difficult at all Not difficult at all Not difficult at all Not difficult at all     Fall Risk: Fall Risk  09/26/2018 07/02/2018 06/27/2018 06/13/2018 04/22/2018  Falls in the past year? 0 0 No No No  Number falls in past yr: 0 0 - - -  Injury with Fall? 0 - - - -     Assessment & Plan  1. Chronic hip pain, bilateral  - Ambulatory referral to Orthopedic Surgery  2. Arm pain, right  - Ambulatory referral to Orthopedic Surgery  3. Stage 3 chronic kidney disease ([place])  Recheck labs next visit  4. Pure hypercholesterolemia   5. Benign hypertension with chronic kidney disease, stage III ([facility])  - valsartan-hydrochlorothiazide (DIOVAN HCT) 80-12.5 MG tablet; Take 1 tablet by mouth daily.  Dispense: 90 tablet; Refill: 1  6. Essential hypertension  - valsartan-hydrochlorothiazide (DIOVAN HCT) 80-12.5 MG tablet; Take 1 tablet by mouth daily.  Dispense: 90 tablet; Refill: 1  7. Erectile dysfunction, unspecified erectile dysfunction type  - tadalafil (ADCIRCA/CIALIS) 20 MG tablet; Take 0.5-1 tablets (10-20 mg total) by mouth every other day as needed for erectile dysfunction.   Dispense:  60 tablet; Refill: 0  8. Herpes genitalis in men  - valACYclovir (VALTREX) 1000 MG tablet; Take 1 tablet (1,000 mg total) by mouth daily.  Dispense: 90 tablet; Refill: 3

## 2018-10-08 ENCOUNTER — Other Ambulatory Visit: Payer: Self-pay | Admitting: Family Medicine

## 2018-10-08 DIAGNOSIS — F5104 Psychophysiologic insomnia: Secondary | ICD-10-CM

## 2018-10-16 DIAGNOSIS — M1612 Unilateral primary osteoarthritis, left hip: Secondary | ICD-10-CM | POA: Diagnosis not present

## 2018-10-16 DIAGNOSIS — M1611 Unilateral primary osteoarthritis, right hip: Secondary | ICD-10-CM | POA: Diagnosis not present

## 2018-10-24 DIAGNOSIS — M25551 Pain in right hip: Secondary | ICD-10-CM | POA: Diagnosis not present

## 2018-11-11 ENCOUNTER — Telehealth: Payer: Self-pay | Admitting: Family Medicine

## 2018-11-11 DIAGNOSIS — N529 Male erectile dysfunction, unspecified: Secondary | ICD-10-CM

## 2018-11-11 NOTE — Telephone Encounter (Signed)
Refill request for general medication: Revatio 20 mg  Last office visit: 09/26/2018  Follow-ups on file. 03/26/2019

## 2018-11-12 ENCOUNTER — Other Ambulatory Visit: Payer: Self-pay | Admitting: Family Medicine

## 2018-11-12 DIAGNOSIS — N529 Male erectile dysfunction, unspecified: Secondary | ICD-10-CM

## 2018-11-12 MED ORDER — SILDENAFIL CITRATE 100 MG PO TABS: 50.0000 mg | ORAL_TABLET | Freq: Every day | ORAL | 1 refills | Status: DC | PRN

## 2018-11-12 NOTE — Telephone Encounter (Signed)
Pt states that he never received Cialis and the last refill was for sildenafil (REVATIO) 20 MG tablet. Please verify and contact pt.

## 2018-11-12 NOTE — Telephone Encounter (Signed)
You sent Revatio to [facility] on 09/26/2018

## 2018-11-12 NOTE — Telephone Encounter (Signed)
Sending 30 to [facility] with good rx voucher to last until follow up

## 2019-02-26 DIAGNOSIS — G894 Chronic pain syndrome: Secondary | ICD-10-CM | POA: Diagnosis not present

## 2019-02-26 DIAGNOSIS — M1612 Unilateral primary osteoarthritis, left hip: Secondary | ICD-10-CM | POA: Diagnosis not present

## 2019-02-26 DIAGNOSIS — M1611 Unilateral primary osteoarthritis, right hip: Secondary | ICD-10-CM | POA: Diagnosis not present

## 2019-03-06 DIAGNOSIS — M25551 Pain in right hip: Secondary | ICD-10-CM | POA: Diagnosis not present

## 2019-03-19 ENCOUNTER — Other Ambulatory Visit: Payer: Self-pay | Admitting: Family Medicine

## 2019-03-19 DIAGNOSIS — I129 Hypertensive chronic kidney disease with stage 1 through stage 4 chronic kidney disease, or unspecified chronic kidney disease: Secondary | ICD-10-CM

## 2019-03-19 DIAGNOSIS — N183 Chronic kidney disease, stage 3 unspecified: Secondary | ICD-10-CM

## 2019-03-19 DIAGNOSIS — I10 Essential (primary) hypertension: Secondary | ICD-10-CM

## 2019-03-26 ENCOUNTER — Encounter: Payer: Self-pay | Admitting: Family Medicine

## 2019-03-26 ENCOUNTER — Other Ambulatory Visit: Payer: Self-pay

## 2019-03-26 ENCOUNTER — Ambulatory Visit (INDEPENDENT_AMBULATORY_CARE_PROVIDER_SITE_OTHER): Payer: Medicare HMO | Admitting: Family Medicine

## 2019-03-26 VITALS — BP 124/84 | HR 70 | Temp 96.6°F | Resp 14 | Ht 71.0 in | Wt 197.8 lb

## 2019-03-26 DIAGNOSIS — E7800 Pure hypercholesterolemia, unspecified: Secondary | ICD-10-CM | POA: Diagnosis not present

## 2019-03-26 DIAGNOSIS — M25551 Pain in right hip: Secondary | ICD-10-CM

## 2019-03-26 DIAGNOSIS — G8929 Other chronic pain: Secondary | ICD-10-CM | POA: Diagnosis not present

## 2019-03-26 DIAGNOSIS — N183 Chronic kidney disease, stage 3 unspecified: Secondary | ICD-10-CM

## 2019-03-26 DIAGNOSIS — N529 Male erectile dysfunction, unspecified: Secondary | ICD-10-CM | POA: Diagnosis not present

## 2019-03-26 DIAGNOSIS — I10 Essential (primary) hypertension: Secondary | ICD-10-CM | POA: Diagnosis not present

## 2019-03-26 DIAGNOSIS — M25552 Pain in left hip: Secondary | ICD-10-CM

## 2019-03-26 DIAGNOSIS — R69 Illness, unspecified: Secondary | ICD-10-CM | POA: Diagnosis not present

## 2019-03-26 DIAGNOSIS — R739 Hyperglycemia, unspecified: Secondary | ICD-10-CM

## 2019-03-26 DIAGNOSIS — I129 Hypertensive chronic kidney disease with stage 1 through stage 4 chronic kidney disease, or unspecified chronic kidney disease: Secondary | ICD-10-CM

## 2019-03-26 DIAGNOSIS — F5104 Psychophysiologic insomnia: Secondary | ICD-10-CM

## 2019-03-26 MED ORDER — VALSARTAN-HYDROCHLOROTHIAZIDE 80-12.5 MG PO TABS: 1.0000 | ORAL_TABLET | Freq: Every day | ORAL | 0 refills | Status: DC

## 2019-03-26 MED ORDER — TRAZODONE HCL 100 MG PO TABS: 100.0000 mg | ORAL_TABLET | Freq: Every evening | ORAL | 1 refills | Status: DC | PRN

## 2019-03-26 MED ORDER — SILDENAFIL CITRATE 100 MG PO TABS: 50.0000 mg | ORAL_TABLET | Freq: Every day | ORAL | 1 refills | Status: DC | PRN

## 2019-03-26 NOTE — Progress Notes (Signed)
Name: Frederick Nelson   MRN: [id number]    DOB: Apr 27, 1951   Date:03/26/2019       Progress Note  Subjective  Chief Complaint  Chief Complaint  Patient presents with  . Medication Refill  . Hypertension  . CKI stage 3  . Erectile Dysfunction  . Herpes genitalis  . Bilateral hip pain    HPI  HTN: bp has been at goal now, no chest pain or palpitation, no SOB, he is on low dose of valsartan/hctz 80/12.5, needs refills.    CKI stage 3: stable GFR,  normal urine output. No pruritus . We will recheck labs today   Bilateral hip pain: he has a long history of hip pain, seen by [facility]. Dr. Catalina Antigua and states first right hip injection really improved the symptoms but pain increased again, usually triggered by activity. Had repeat injection a couple of weeks ago and is not working as much, still in pain. He is contemplating  hip replacement surgery   ED: he has not been sexually active in months because of COVID-19, he lost the voucher for Viagra . He has lack of libido   Herpes genitalis: he is taking prophylactic medication and no recent episodes . Unchanged   Possible COVID: he sates symptoms started the end of March with cough, fatigue, also had a few days of lack of sense of smell and taste, he did not check his temperature, he feels much better but still has episodes of fatigue. He states his internal chest feels different    Patient Active Problem List   Diagnosis Date Noted  . Sinus bradycardia 06/19/2018  . Hyperlipidemia 11/02/2017  . 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 Erectile dysfunction 08/09/2016  . Vitamin D deficiency 08/09/2016  . Blurred vision, bilateral 04/27/2016  . 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 Benign hypertension with chronic kidney disease, stage III ([place]) 01/26/2016  . Chronic insomnia 01/26/2016  . Bilateral leg edema 01/26/2016  . History of colon cancer 06/10/2015    Past Surgical History:  Procedure  Laterality Date  . COLON SURGERY  06/26/2007   right hemicolectomy-T2N0 carcinoma of right colon. The tumor was 2.6 cm,low grade with zero of 14 nodes positive. No evidence of venous or lymphatic invasion  . COLONOSCOPY  [id number]  . COLONOSCOPY WITH PROPOFOL N/A 08/04/2015   Procedure: COLONOSCOPY WITH PROPOFOL;  Surgeon: [name], MD;  Location: [facility] ENDOSCOPY;  Service: Endoscopy;  Laterality: N/A;    Family History  Problem Relation Age of Onset  . Cancer Brother        cancer of the thymus  . Cancer Maternal Aunt        breast cancer    Social History   Socioeconomic History  . Marital status: Divorced    Spouse name: Not on file  . Number of children: 1  . Years of education: 64  . Highest education level: Some college, no degree  Occupational History  . Not on file  Social Needs  . Financial resource strain: Not hard at all  . Food insecurity    Worry: Never true    Inability: Never true  . Transportation needs    Medical: No    Non-medical: No  Tobacco Use  . Smoking status: Never Smoker  . Smokeless tobacco: Never Used  Substance and Sexual Activity  . Alcohol use: Yes    Alcohol/week: 1.0 - 2.0 standard drinks    Types: 1 - 2  Standard drinks or equivalent per week    Comment: drinks rarely  . Drug use: No  . Sexual activity: Yes    Birth control/protection: None  Lifestyle  . Physical activity    Days per week: 0 days    Minutes per session: 0 min  . Stress: Not at all  Relationships  . Social connections    Talks on phone: More than three times a week    Gets together: Twice a week    Attends religious service: 1 to 4 times per year    Active member of club or organization: Yes    Attends meetings of clubs or organizations: More than 4 times per year    Relationship status: Divorced  . Intimate partner violence    Fear of current or ex partner: No    Emotionally abused: No    Physically abused: No    Forced sexual activity: No  Other  Topics Concern  . Not on file  Social History Narrative  . Not on file     Current Outpatient Medications:  .  aspirin EC 81 MG tablet, Take 81 mg by mouth daily., Disp: , Rfl:  .  loratadine (CLARITIN) 10 MG tablet, TAKE 1 TABLET BY MOUTH EVERY DAY, Disp: 90 tablet, Rfl: 1 .  sildenafil (VIAGRA) 100 MG tablet, Take 0.5-1 tablets (50-100 mg total) by mouth daily as needed for erectile dysfunction., Disp: 30 tablet, Rfl: 1 .  valACYclovir (VALTREX) 1000 MG tablet, Take 1 tablet (1,000 mg total) by mouth daily., Disp: 90 tablet, Rfl: 3 .  valsartan-hydrochlorothiazide (DIOVAN-HCT) 80-12.5 MG tablet, TAKE 1 TABLET BY MOUTH EVERY DAY, Disp: 90 tablet, Rfl: 0 .  traZODone (DESYREL) 50 MG tablet, TAKE 1.5 TABLETS (75 MG TOTAL) BY MOUTH AT BEDTIME AS NEEDED FOR SLEEP., Disp: 135 tablet, Rfl: 1  Allergies  Allergen Reactions  . Ace Inhibitors Swelling  . Ciprofloxacin     Tendon damage    I personally reviewed active problem list, medication list, allergies, family history, social history with the patient/caregiver today.   ROS  Constitutional: Negative for fever or weight change.  Respiratory: Negative for cough and shortness of breath.   Cardiovascular: Negative for chest pain or palpitations.  Gastrointestinal: Negative for abdominal pain, no bowel changes.  Musculoskeletal: Negative for gait problem or joint swelling.  Skin: Negative for rash.  Neurological: Negative for dizziness or headache.  No other specific complaints in a complete review of systems (except as listed in HPI above).  Objective  Vitals:   03/26/19 0959  BP: 124/84  Pulse: 70  Resp: 14  Temp: (!) 96.6 F (35.9 C)  TempSrc: Temporal  SpO2: 99%  Weight: 197 lb 12.8 oz (89.7 kg)  Height: 5\' 11"  (1.803 m)    Body mass index is 27.59 kg/m.  Physical Exam  Constitutional: Patient appears well-developed and well-nourished. Obese  No distress.  HEENT: head atraumatic, normocephalic, pupils equal and  reactive to light,  neck supple Cardiovascular: Normal rate, regular rhythm and normal heart sounds.  No murmur heard. No BLE edema. Pulmonary/Chest: Effort normal and breath sounds normal. No respiratory distress. Abdominal: Soft.  There is no tenderness. Muscular skeletal: right hip pain and decrease in rom - worse with internal rotation of right hip  Psychiatric: Patient has a normal mood and affect. behavior is normal. Judgment and thought content normal.  PHQ2/9: Depression screen [facility] 2/9 03/26/2019 07/02/2018 06/27/2018 06/13/2018 04/22/2018  Decreased Interest 0 0 0 0 0  Down, Depressed,  Hopeless 0 0 0 0 0  PHQ - 2 Score 0 0 0 0 0  Altered sleeping 0 0 1 0 0  Tired, decreased energy 0 0 1 0 0  Change in appetite 0 0 1 0 0  Feeling bad or failure about yourself  0 0 0 0 0  Trouble concentrating 0 0 0 0 0  Moving slowly or fidgety/restless 0 0 0 0 0  Suicidal thoughts 0 0 0 0 0  PHQ-9 Score 0 0 3 0 0  Difficult doing work/chores Not difficult at all Not difficult at all Not difficult at all Not difficult at all Not difficult at all    phq 9 is negative   Fall Risk: Fall Risk  03/26/2019 09/26/2018 07/02/2018 06/27/2018 06/13/2018  Falls in the past year? 0 0 0 No No  Number falls in past yr: 0 0 0 - -  Injury with Fall? 0 0 - - -    Functional Status Survey: Is the patient deaf or have difficulty hearing?: No Does the patient have difficulty seeing, even when wearing glasses/contacts?: Yes Does the patient have difficulty concentrating, remembering, or making decisions?: No Does the patient have difficulty walking or climbing stairs?: No Does the patient have difficulty dressing or bathing?: No Does the patient have difficulty doing errands alone such as visiting a doctor's office or shopping?: No    Assessment & Plan  1. Essential hypertension  - valsartan-hydrochlorothiazide (DIOVAN-HCT) 80-12.5 MG tablet; Take 1 tablet by mouth daily.  Dispense: 90 tablet; Refill:  0  2. Stage 3 chronic kidney disease (HCC)  - COMPLETE METABOLIC PANEL WITH GFR  3. Chronic hip pain, bilateral  Seeing ortho has OA , discussed importance of avoiding nsaid's  4. Benign hypertension with chronic kidney disease, stage III (HCC)  - COMPLETE METABOLIC PANEL WITH GFR - CBC with Differential/Platelet - valsartan-hydrochlorothiazide (DIOVAN-HCT) 80-12.5 MG tablet; Take 1 tablet by mouth daily.  Dispense: 90 tablet; Refill: 0  5. Pure hypercholesterolemia  - Lipid panel  6. Erectile dysfunction, unspecified erectile dysfunction type  - sildenafil (VIAGRA) 100 MG tablet; Take 0.5-1 tablets (50-100 mg total) by mouth daily as needed for erectile dysfunction.  Dispense: 30 tablet; Refill: 1  7. Chronic insomnia  - traZODone (DESYREL) 100 MG tablet; Take 1 tablet (100 mg total) by mouth at bedtime as needed for sleep.  Dispense: 90 tablet; Refill: 1  8. Hyperglycemia  - Hemoglobin A1c

## 2019-03-27 LAB — COMPLETE METABOLIC PANEL WITH GFR
AG Ratio: 1.7 (calc) (ref 1.0–2.5)
ALT: 17 U/L (ref 9–46)
AST: 18 U/L (ref 10–35)
Albumin: 4.2 g/dL (ref 3.6–5.1)
Alkaline phosphatase (APISO): 46 U/L (ref 35–144)
BUN/Creatinine Ratio: 17 (calc) (ref 6–22)
BUN: 28 mg/dL — ABNORMAL HIGH (ref 7–25)
CO2: 28 mmol/L (ref 20–32)
Calcium: 9.4 mg/dL (ref 8.6–10.3)
Chloride: 105 mmol/L (ref 98–110)
Creat: 1.69 mg/dL — ABNORMAL HIGH (ref 0.70–1.25)
GFR, Est African American: 48 mL/min/{1.73_m2} — ABNORMAL LOW (ref >=60)
GFR, Est Non African American: 41 mL/min/{1.73_m2} — ABNORMAL LOW (ref >=60)
Globulin: 2.5 g/dL (calc) (ref 1.9–3.7)
Glucose, Bld: 92 mg/dL (ref 65–99)
Potassium: 4 mmol/L (ref 3.5–5.3)
Sodium: 141 mmol/L (ref 135–146)
Total Bilirubin: 0.6 mg/dL (ref 0.2–1.2)
Total Protein: 6.7 g/dL (ref 6.1–8.1)

## 2019-03-27 LAB — CBC WITH DIFFERENTIAL/PLATELET
Absolute Monocytes: 432 cells/uL (ref 200–950)
Basophils Absolute: 59 cells/uL (ref 0–200)
Basophils Relative: 1.3 %
Eosinophils Absolute: 243 cells/uL (ref 15–500)
Eosinophils Relative: 5.4 %
HCT: 42.1 % (ref 38.5–50.0)
Hemoglobin: 14.3 g/dL (ref 13.2–17.1)
Lymphs Abs: 2493 cells/uL (ref 850–3900)
MCH: 31.5 pg (ref 27.0–33.0)
MCHC: 34 g/dL (ref 32.0–36.0)
MCV: 92.7 fL (ref 80.0–100.0)
MPV: 11.5 fL (ref 7.5–12.5)
Monocytes Relative: 9.6 %
Neutro Abs: 1274 cells/uL — ABNORMAL LOW (ref 1500–7800)
Neutrophils Relative %: 28.3 %
Platelets: 166 10*3/uL (ref 140–400)
RBC: 4.54 10*6/uL (ref 4.20–5.80)
RDW: 12.3 % (ref 11.0–15.0)
Total Lymphocyte: 55.4 %
WBC: 4.5 10*3/uL (ref 3.8–10.8)

## 2019-03-27 LAB — LIPID PANEL
Cholesterol: 211 mg/dL — ABNORMAL HIGH (ref <200)
HDL: 51 mg/dL (ref >=40)
LDL Cholesterol (Calc): 137 mg/dL (calc) — ABNORMAL HIGH
Non-HDL Cholesterol (Calc): 160 mg/dL (calc) — ABNORMAL HIGH (ref <130)
Total CHOL/HDL Ratio: 4.1 (calc) (ref <5.0)
Triglycerides: 112 mg/dL (ref <150)

## 2019-03-27 LAB — HEMOGLOBIN A1C
Hgb A1c MFr Bld: 6 % of total Hgb — ABNORMAL HIGH (ref <5.7)
Mean Plasma Glucose: 126 (calc)
eAG (mmol/L): 7 (calc)

## 2019-04-02 ENCOUNTER — Other Ambulatory Visit: Payer: Self-pay | Admitting: Family Medicine

## 2019-04-02 ENCOUNTER — Encounter: Payer: Self-pay | Admitting: General Surgery

## 2019-04-02 DIAGNOSIS — F5104 Psychophysiologic insomnia: Secondary | ICD-10-CM

## 2019-04-21 DIAGNOSIS — Z20828 Contact with and (suspected) exposure to other viral communicable diseases: Secondary | ICD-10-CM | POA: Diagnosis not present

## 2019-04-22 DIAGNOSIS — Z20828 Contact with and (suspected) exposure to other viral communicable diseases: Secondary | ICD-10-CM | POA: Diagnosis not present

## 2019-06-13 ENCOUNTER — Telehealth: Payer: Self-pay | Admitting: Family Medicine

## 2019-06-13 DIAGNOSIS — F5104 Psychophysiologic insomnia: Secondary | ICD-10-CM

## 2019-06-13 NOTE — Telephone Encounter (Signed)
Requested medication (s) are due for refill today: no  Requested medication (s) are on the active medication list:no  Last refill:  01/07/2019  Future visit scheduled: no  Notes to clinic:  Medication dosage changed   Requested Prescriptions  Pending Prescriptions Disp Refills   traZODone (DESYREL) 50 MG tablet [Pharmacy Med Name: TRAZODONE 50 MG TABLET] 135 tablet 1    Sig: TAKE 1.5 TABLETS (75 MG TOTAL) BY MOUTH AT BEDTIME AS NEEDED FOR SLEEP.     Psychiatry: Antidepressants - Serotonin Modulator Failed - 06/13/2019 10:58 AM      Failed - Completed PHQ-2 or PHQ-9 in the last 360 days.      Passed - Valid encounter within last 6 months    Recent Outpatient Visits          2 months ago Essential hypertension   [facility] [place], Drue Stager, MD   8 months ago Stage 3 chronic kidney disease [facility])   [facility] Steele Sizer, MD   11 months ago Stage 3 chronic kidney disease [facility])   [facility] Steele Sizer, MD   1 year ago Weakness   [place], FNP   1 year ago Benign hypertension with chronic kidney disease, stage III [facility])   [place], [place], [place]

## 2019-06-18 NOTE — Telephone Encounter (Signed)
Pt states he does not need this refill as of right now

## 2019-06-25 DIAGNOSIS — Z23 Encounter for immunization: Secondary | ICD-10-CM | POA: Diagnosis not present

## 2019-07-01 ENCOUNTER — Ambulatory Visit: Payer: Medicare HMO

## 2019-07-14 ENCOUNTER — Ambulatory Visit (INDEPENDENT_AMBULATORY_CARE_PROVIDER_SITE_OTHER): Payer: Medicare HMO | Admitting: Family Medicine

## 2019-07-14 ENCOUNTER — Other Ambulatory Visit: Payer: Self-pay

## 2019-07-14 ENCOUNTER — Encounter: Payer: Self-pay | Admitting: Family Medicine

## 2019-07-14 VITALS — Temp 99.1°F

## 2019-07-14 DIAGNOSIS — J989 Respiratory disorder, unspecified: Secondary | ICD-10-CM

## 2019-07-14 DIAGNOSIS — J302 Other seasonal allergic rhinitis: Secondary | ICD-10-CM | POA: Diagnosis not present

## 2019-07-14 MED ORDER — LORATADINE 10 MG PO TABS: 10.0000 mg | ORAL_TABLET | Freq: Every day | ORAL | 1 refills | Status: DC

## 2019-07-14 NOTE — Progress Notes (Signed)
Name: Frederick Nelson   MRN: [id number]    DOB: Oct 09, 1950   Date:07/14/2019       Progress Note  Subjective  Chief Complaint  Chief Complaint  Patient presents with  . Cough    Onset- Saturday night has tried to take Cold and Flu with a cough suppression in it. Saturday night alot of clear mucus-lungs felt like it was full of mucus. Today it is yellowish-tan mucus. Has made his chest sore from excessive coughing  . Sore Throat    Scratchy Throat-low grade fever   . Nasal Congestion    Runny nose     I connected with  Frederick Nelson on 07/14/19 at  1:20 PM EST by telephone and verified that I am speaking with the correct person using two identifiers.   I discussed the limitations, risks, security and privacy concerns of performing an evaluation and management service by telephone and the availability of in person appointments. Staff also discussed with the patient that there may be a patient responsible charge related to this service. Patient Location: Home Provider Location: Office Additional Individuals present: None  HPI  Pt presents with concern for respiratory illness symptoms that started Saturday 07/12/2019.  He notes cough has been persistent, having some chest congestion, scratchy throat, rhinorrhea, and low grade temp at 99.49F.   Took cold and flu medication this morning at 0100 which did help his cough some and allowed him to sleep.  He has mucinex at home, also has some cough medication at home that he thinks will work for him.  He does live alone, no known COVID-19 exposure.  Patient Active Problem List   Diagnosis Date Noted  . Sinus bradycardia 06/19/2018  . Hyperlipidemia 11/02/2017  . 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 Erectile dysfunction 08/09/2016  . Vitamin D deficiency 08/09/2016  . Blurred vision, bilateral 04/27/2016  . 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 Benign hypertension with chronic kidney disease, stage III  ([place]) 01/26/2016  . Chronic insomnia 01/26/2016  . Bilateral leg edema 01/26/2016  . History of colon cancer 06/10/2015    Social History   Tobacco Use  . Smoking status: Never Smoker  . Smokeless tobacco: Never Used  Substance Use Topics  . Alcohol use: Yes    Alcohol/week: 1.0 - 2.0 standard drinks    Types: 1 - 2 Standard drinks or equivalent per week    Comment: drinks rarely     Current Outpatient Medications:  .  aspirin EC 81 MG tablet, Take 81 mg by mouth daily., Disp: , Rfl:  .  loratadine (CLARITIN) 10 MG tablet, TAKE 1 TABLET BY MOUTH EVERY DAY, Disp: 90 tablet, Rfl: 1 .  sildenafil (VIAGRA) 100 MG tablet, Take 0.5-1 tablets (50-100 mg total) by mouth daily as needed for erectile dysfunction., Disp: 30 tablet, Rfl: 1 .  traZODone (DESYREL) 100 MG tablet, Take 1 tablet (100 mg total) by mouth at bedtime as needed for sleep., Disp: 90 tablet, Rfl: 1 .  valACYclovir (VALTREX) 1000 MG tablet, Take 1 tablet (1,000 mg total) by mouth daily., Disp: 90 tablet, Rfl: 3 .  valsartan-hydrochlorothiazide (DIOVAN-HCT) 80-12.5 MG tablet, Take 1 tablet by mouth daily., Disp: 90 tablet, Rfl: 0  Allergies  Allergen Reactions  . Ace Inhibitors Swelling  . Ciprofloxacin     Tendon damage    I personally reviewed active problem list, medication list, allergies with the patient/caregiver today.  ROS  Ten systems reviewed and is  negative except as mentioned in HPI  Objective  Virtual encounter, vitals not obtained.  There is no height or weight on file to calculate BMI.  Nursing Note and Vital Signs reviewed.  Physical Exam  Pulmonary/Chest: Effort normal. No respiratory distress. Speaking in complete sentences Neurological: Pt is alert and oriented to person, place, and time. Speech is normal. Psychiatric: Patient has a normal mood and affect. behavior is normal. Judgment and thought content normal.  No results found for this or any previous visit (from the past 72  hour(s)).  Assessment & Plan  1. Respiratory illness - Wants to take OTC flu and cold which I advised is ok as long as there is no decongestant like sudafed in it.  He will also restart claritin.  Stay well hydrated.  Will quarantine and go for testing tomorrow,. - Novel Coronavirus, NAA ([facility])  2. Seasonal allergic rhinitis, unspecified trigger - loratadine (CLARITIN) 10 MG tablet; Take 1 tablet (10 mg total) by mouth daily.  Dispense: 90 tablet; Refill: 1  -Red flags and when to present for emergency care or RTC including fever >101.82F, chest pain, shortness of breath, new/worsening/un-resolving symptoms, reviewed with patient at time of visit. Follow up and care instructions discussed and provided in AVS. - I discussed the assessment and treatment plan with the patient. The patient was provided an opportunity to ask questions and all were answered. The patient agreed with the plan and demonstrated an understanding of the instructions.  - The patient was advised to call back or seek an in-person evaluation if the symptoms worsen or if the condition fails to improve as anticipated.  I provided 11 minutes of non-face-to-face time during this encounter.  Hubbard Hartshorn, FNP

## 2019-07-15 ENCOUNTER — Other Ambulatory Visit: Payer: Self-pay

## 2019-07-15 DIAGNOSIS — Z20822 Contact with and (suspected) exposure to covid-19: Secondary | ICD-10-CM

## 2019-07-18 ENCOUNTER — Other Ambulatory Visit: Payer: Self-pay

## 2019-07-18 ENCOUNTER — Ambulatory Visit (INDEPENDENT_AMBULATORY_CARE_PROVIDER_SITE_OTHER): Payer: Medicare HMO

## 2019-07-18 VITALS — BP 130/73 | HR 58 | Temp 96.9°F | Ht 71.0 in | Wt 197.0 lb

## 2019-07-18 DIAGNOSIS — Z0000 Encounter for general adult medical examination without abnormal findings: Secondary | ICD-10-CM | POA: Diagnosis not present

## 2019-07-18 NOTE — Progress Notes (Signed)
Subjective:   Frederick Nelson is a 68 y.o. male who presents for Medicare Annual/Subsequent preventive examination.  Virtual Visit via Telephone Note  I connected with Frederick Nelson on 07/18/19 at  9:20 AM EST by telephone and verified that I am speaking with the correct person using two identifiers.  Medicare Annual Wellness visit completed telephonically due to Covid-19 pandemic.   Location: Patient: home Provider: office   I discussed the limitations, risks, security and privacy concerns of performing an evaluation and management service by telephone and the availability of in person appointments. The patient expressed understanding and agreed to proceed.  Some vital signs may be absent or patient reported.   Frederick Marker, LPN    Review of Systems:   Cardiac Risk Factors include: advanced age (>61men, >82 women);male gender;hypertension     Objective:    Vitals: BP 130/73   Pulse (!) 58   Temp (!) 96.9 F (36.1 C)   Ht 5\' 11"  (1.803 m)   Wt 197 lb (89.4 kg)   BMI 27.48 kg/m   Body mass index is 27.48 kg/m.  Advanced Directives 07/18/2019 03/16/2017 03/07/2017 12/08/2016 08/09/2016 05/11/2016 04/27/2016  Does Patient Have a Medical Advance Directive? No No No No No No No  Would patient like information on creating a medical advance directive? Yes (MAU/Ambulatory/Procedural Areas - Information given) - - - - No - patient declined information No - patient declined information    Tobacco Social History   Tobacco Use  Smoking Status Never Smoker  Smokeless Tobacco Never Used     Counseling given: Not Answered   Clinical Intake:  Pre-visit preparation completed: Yes  Pain : No/denies pain     BMI - recorded: 27.48 Nutritional Status: BMI 25 -29 Overweight Nutritional Risks: None Diabetes: No  How often do you need to have someone help you when you read instructions, pamphlets, or other written materials from your doctor or pharmacy?: 1 - Never   Interpreter Needed?: No  Information entered by :: Frederick Marker LPN  Past Medical History:  Diagnosis Date  . Abdominal pain, right lower quadrant   . Allergy   . Arthritis   . Benign hypertension with chronic kidney disease, stage III ([place])   . Chronic low back pain   . Degenerative joint disease (DJD) of hip   . Genital herpes    Takes Valtrex for prevention  . GERD (gastroesophageal reflux disease) 1995  . Hyperlipidemia   . Hypertension   . Malignant neoplasm of ascending colon ([place]) 2011  . Special screening for malignant neoplasms, colon 2011   Past Surgical History:  Procedure Laterality Date  . COLON SURGERY  06/26/2007   right hemicolectomy-T2N0 carcinoma of right colon. The tumor was 2.6 cm,low grade with zero of 14 nodes positive. No evidence of venous or lymphatic invasion  . COLONOSCOPY  [id number]  . COLONOSCOPY WITH PROPOFOL N/A 08/04/2015   Procedure: COLONOSCOPY WITH PROPOFOL;  Surgeon: Frederick Bellow, MD;  Location: [facility] ENDOSCOPY;  Service: Endoscopy;  Laterality: N/A;   Family History  Problem Relation Age of Onset  . Cancer Brother        cancer of the thymus  . Diabetes Mother   . Hypertension Father   . Stroke Father   . Cancer Maternal Aunt        breast cancer   Social History   Socioeconomic History  . Marital status: Divorced    Spouse name: Not on file  . Number of children:  1  . Years of education: 58  . Highest education level: Some college, no degree  Occupational History  . Not on file  Social Needs  . Financial resource strain: Not hard at all  . Food insecurity    Worry: Never true    Inability: Never true  . Transportation needs    Medical: No    Non-medical: No  Tobacco Use  . Smoking status: Never Smoker  . Smokeless tobacco: Never Used  Substance and Sexual Activity  . Alcohol use: Yes    Alcohol/week: 1.0 - 2.0 standard drinks    Types: 1 - 2 Standard drinks or equivalent per week    Comment: Socially only - Very  little  . Drug use: No  . Sexual activity: Yes    Birth control/protection: None  Lifestyle  . Physical activity    Days per week: 0 days    Minutes per session: 0 min  . Stress: Not at all  Relationships  . Social connections    Talks on phone: More than three times a week    Gets together: Twice a week    Attends religious service: 1 to 4 times per year    Active member of club or organization: Yes    Attends meetings of clubs or organizations: More than 4 times per year    Relationship status: Divorced  Other Topics Concern  . Not on file  Social History Narrative  . Not on file    Outpatient Encounter Medications as of 07/18/2019  Medication Sig  . aspirin EC 81 MG tablet Take 81 mg by mouth daily.  Marland Kitchen loratadine (CLARITIN) 10 MG tablet Take 1 tablet (10 mg total) by mouth daily.  . sildenafil (VIAGRA) 100 MG tablet Take 0.5-1 tablets (50-100 mg total) by mouth daily as needed for erectile dysfunction.  . valACYclovir (VALTREX) 1000 MG tablet Take 1 tablet (1,000 mg total) by mouth daily.  . valsartan-hydrochlorothiazide (DIOVAN-HCT) 80-12.5 MG tablet Take 1 tablet by mouth daily.  . traZODone (DESYREL) 100 MG tablet Take 1 tablet (100 mg total) by mouth at bedtime as needed for sleep.   No facility-administered encounter medications on file as of 07/18/2019.     Activities of Daily Living In your present state of health, do you have any difficulty performing the following activities: 07/18/2019 07/14/2019  Hearing? N N  Comment declines hearing aids -  Vision? N Y  Difficulty concentrating or making decisions? N N  Walking or climbing stairs? N N  Dressing or bathing? N N  Doing errands, shopping? N N  Preparing Food and eating ? N -  Using the Toilet? N -  In the past six months, have you accidently leaked urine? Y -  Comment urinary frequency -  Do you have problems with loss of bowel control? N -  Managing your Medications? N -  Managing your Finances? N -   Housekeeping or managing your Housekeeping? N -  Some recent data might be hidden    Patient Care Team: Steele Sizer, MD as PCP - General (Family Medicine) Bary Castilla, Forest Gleason, MD (General Surgery) Isaias Cowman, MD as Consulting Physician (Cardiology)   Assessment:   This is a routine wellness examination for [place].  Exercise Activities and Dietary recommendations Current Exercise Habits: The patient does not participate in regular exercise at present, Exercise limited by: None identified  Goals    . Patient Stated     Pt would like to remain active and complete  renovation on house he is planning to buy. 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        Fall Risk Fall Risk  07/18/2019 07/14/2019 03/26/2019 09/26/2018 07/02/2018  Falls in the past year? 0 0 0 0 0  Number falls in past yr: 0 0 0 0 0  Injury with Fall? 0 0 0 0 -  Follow up Falls prevention discussed - - - -   FALL RISK PREVENTION PERTAINING TO THE HOME:  Any stairs in or around the home? Yes  If so, do they handrails? Yes   Home free of loose throw rugs in walkways, pet beds, electrical cords, etc? Yes  Adequate lighting in your home to reduce risk of falls? Yes   ASSISTIVE DEVICES UTILIZED TO PREVENT FALLS:  Life alert? No  Use of a cane, walker or w/c? No  Grab bars in the bathroom? Yes  Shower chair or bench in shower? No  Elevated toilet seat or a handicapped toilet? Yes  DME ORDERS:  DME order needed?  No   TIMED UP AND GO:  Was the test performed? No . Telephonic visit.   Education: Fall risk prevention has been discussed.  Intervention(s) required? No   Depression Screen PHQ 2/9 Scores 07/18/2019 07/14/2019 03/26/2019 07/02/2018  PHQ - 2 Score 0 0 0 0  PHQ- 9 Score - 0 0 0    Cognitive Function     6CIT Screen 07/18/2019 05/29/2017  What Year? 0 points 0 points  What month? 0 points 0 points  What time? 0 points 0 points  Count back from 20 0 points 0 points  Months in reverse 0  points 0 points  Repeat phrase 0 points 2 points  Total Score 0 2    Immunization History  Administered Date(s) Administered  . Influenza, High Dose Seasonal PF 05/29/2017, 06/27/2018, 06/25/2019  . Influenza,inj,Quad PF,6+ Mos 06/18/2015, 05/11/2016  . Influenza-Unspecified 06/25/2019  . Pneumococcal Conjugate-13 05/29/2017  . Pneumococcal Polysaccharide-23 07/02/2018    Qualifies for Shingles Vaccine? Yes . Due for Shingrix. Education has been provided regarding the importance of this vaccine. Pt has been advised to call insurance company to determine out of pocket expense. Advised may also receive vaccine at local pharmacy or Health Dept. Verbalized acceptance and understanding.  Tdap: Up to date  Flu Vaccine: Up to date  Pneumococcal Vaccine: Up to date    Screening Tests Health Maintenance  Topic Date Due  . COLONOSCOPY  08/03/2020  . TETANUS/TDAP  02/01/2023  . INFLUENZA VACCINE  Completed  . Hepatitis C Screening  Completed  . PNA vac Low Risk Adult  Completed   Cancer Screenings:  Colorectal Screening: Completed 08/04/15. Repeat every 5 years;   Lung Cancer Screening: (Low Dose CT Chest recommended if Age 67-80 years, 30 pack-year currently smoking OR have quit w/in 15years.) does not qualify.   Additional Screening:  Hepatitis C Screening: does qualify; Completed 05/29/17  Vision Screening: Recommended annual ophthalmology exams for early detection of glaucoma and other disorders of the eye. Is the patient up to date with their annual eye exam?  No  - postponed due to Covid Who is the provider or what is the name of the office in which the pt attends annual eye exams? [facility]   Dental Screening: Recommended annual dental exams for proper oral hygiene  Community Resource Referral:  CRR required this visit?  No       Plan:    I have personally reviewed and  addressed the Medicare Annual Wellness questionnaire and have noted the following in the  patient's chart:  A. Medical and social history B. Use of alcohol, tobacco or illicit drugs  C. Current medications and supplements D. Functional ability and status E.  Nutritional status F.  Physical activity G. Advance directives H. List of other physicians I.  Hospitalizations, surgeries, and ER visits in previous 12 months J.  [place] such as hearing and vision if needed, cognitive and depression L. Referrals and appointments   In addition, I have reviewed and discussed with patient certain preventive protocols, quality metrics, and best practice recommendations. A written personalized care plan for preventive services as well as general preventive health recommendations were provided to patient.   Signed,  Frederick Marker, LPN Nurse Health Advisor   Nurse Notes: pt awaiting results from Covid test done 07/15/19. He denies fever and did not exhibit coughing or congestion during telephonic visit. Pt aware to remain quarantined until notified of results.

## 2019-07-18 NOTE — Patient Instructions (Signed)
[name] , Thank you for taking time to come for your Medicare Wellness Visit. I appreciate your ongoing commitment to your health goals. Please review the following plan we discussed and let me know if I can assist you in the future.   Screening recommendations/referrals: Colonoscopy: done 08/04/15. Repeat in 2021. Recommended yearly ophthalmology/optometry visit for glaucoma screening and checkup Recommended yearly dental visit for hygiene and checkup  Vaccinations: Influenza vaccine: done 06/25/19 Pneumococcal vaccine: done 07/02/18 Tdap vaccine: done 01/31/13 Shingles vaccine: Shingrix discussed. Please contact your pharmacy for coverage information.   Advanced directives: Advance directive discussed with you today. I have provided a copy for you to complete at home and have notarized. Once this is complete please bring a copy in to our office so we can scan it into your chart.  Conditions/risks identified: Recommend increasing physical activity to at least 3 days per week  Next appointment: Please follow up in one year for your Medicare Annual Wellness visit.    Preventive Care 67 Years and Older, Male Preventive care refers to lifestyle choices and visits with your health care provider that can promote health and wellness. What does preventive care include?  A yearly physical exam. This is also called an annual well check.  Dental exams once or twice a year.  Routine eye exams. Ask your health care provider how often you should have your eyes checked.  Personal lifestyle choices, including:  Daily care of your teeth and gums.  Regular physical activity.  Eating a healthy diet.  Avoiding tobacco and drug use.  Limiting alcohol use.  Practicing safe sex.  Taking low doses of aspirin every day.  Taking vitamin and mineral supplements as recommended by your health care provider. What happens during an annual well check? The services and screenings done by your  health care provider during your annual well check will depend on your age, overall health, lifestyle risk factors, and family history of disease. Counseling  Your health care provider may ask you questions about your:  Alcohol use.  Tobacco use.  Drug use.  Emotional well-being.  Home and relationship well-being.  Sexual activity.  Eating habits.  History of falls.  Memory and ability to understand (cognition).  Work and work Statistician. Screening  You may have the following tests or measurements:  Height, weight, and BMI.  Blood pressure.  Lipid and cholesterol levels. These may be checked every 5 years, or more frequently if you are over 39 years old.  Skin check.  Lung cancer screening. You may have this screening every year starting at age 33 if you have a 30-pack-year history of smoking and currently smoke or have quit within the past 15 years.  Fecal occult blood test (FOBT) of the stool. You may have this test every year starting at age 38.  Flexible sigmoidoscopy or colonoscopy. You may have a sigmoidoscopy every 5 years or a colonoscopy every 10 years starting at age 52.  Prostate cancer screening. Recommendations will vary depending on your family history and other risks.  Hepatitis C blood test.  Hepatitis B blood test.  Sexually transmitted disease (STD) testing.  Diabetes screening. This is done by checking your blood sugar (glucose) after you have not eaten for a while (fasting). You may have this done every 1-3 years.  Abdominal aortic aneurysm (AAA) screening. You may need this if you are a current or former smoker.  Osteoporosis. You may be screened starting at age 20 if you are at high risk.  Talk with your health care provider about your test results, treatment options, and if necessary, the need for more tests. Vaccines  Your health care provider may recommend certain vaccines, such as:  Influenza vaccine. This is recommended every year.   Tetanus, diphtheria, and acellular pertussis (Tdap, Td) vaccine. You may need a Td booster every 10 years.  Zoster vaccine. You may need this after age 47.  Pneumococcal 13-valent conjugate (PCV13) vaccine. One dose is recommended after age 45.  Pneumococcal polysaccharide (PPSV23) vaccine. One dose is recommended after age 56. Talk to your health care provider about which screenings and vaccines you need and how often you need them. This information is not intended to replace advice given to you by your health care provider. Make sure you discuss any questions you have with your health care provider. Document Released: 09/10/2015 Document Revised: 05/03/2016 Document Reviewed: 06/15/2015 Elsevier Interactive Patient Education  2017 [place] Prevention in the Home Falls can cause injuries. They can happen to people of all ages. There are many things you can do to make your home safe and to help prevent falls. What can I do on the outside of my home?  Regularly fix the edges of walkways and driveways and fix any cracks.  Remove anything that might make you trip as you walk through a door, such as a raised step or threshold.  Trim any bushes or trees on the path to your home.  Use bright outdoor lighting.  Clear any walking paths of anything that might make someone trip, such as rocks or tools.  Regularly check to see if handrails are loose or broken. Make sure that both sides of any steps have handrails.  Any raised decks and porches should have guardrails on the edges.  Have any leaves, snow, or ice cleared regularly.  Use sand or salt on walking paths during winter.  Clean up any spills in your garage right away. This includes oil or grease spills. What can I do in the bathroom?  Use night lights.  Install grab bars by the toilet and in the tub and shower. Do not use towel bars as grab bars.  Use non-skid mats or decals in the tub or shower.  If you need to  sit down in the shower, use a plastic, non-slip stool.  Keep the floor dry. Clean up any water that spills on the floor as soon as it happens.  Remove soap buildup in the tub or shower regularly.  Attach bath mats securely with double-sided non-slip rug tape.  Do not have throw rugs and other things on the floor that can make you trip. What can I do in the bedroom?  Use night lights.  Make sure that you have a light by your bed that is easy to reach.  Do not use any sheets or blankets that are too big for your bed. They should not hang down onto the floor.  Have a firm chair that has side arms. You can use this for support while you get dressed.  Do not have throw rugs and other things on the floor that can make you trip. What can I do in the kitchen?  Clean up any spills right away.  Avoid walking on wet floors.  Keep items that you use a lot in easy-to-reach places.  If you need to reach something above you, use a strong step stool that has a grab bar.  Keep electrical cords out of the way.  Do not use floor polish or wax that makes floors slippery. If you must use wax, use non-skid floor wax.  Do not have throw rugs and other things on the floor that can make you trip. What can I do with my stairs?  Do not leave any items on the stairs.  Make sure that there are handrails on both sides of the stairs and use them. Fix handrails that are broken or loose. Make sure that handrails are as long as the stairways.  Check any carpeting to make sure that it is firmly attached to the stairs. Fix any carpet that is loose or worn.  Avoid having throw rugs at the top or bottom of the stairs. If you do have throw rugs, attach them to the floor with carpet tape.  Make sure that you have a light switch at the top of the stairs and the bottom of the stairs. If you do not have them, ask someone to add them for you. What else can I do to help prevent falls?  Wear shoes that:  Do not  have high heels.  Have rubber bottoms.  Are comfortable and fit you well.  Are closed at the toe. Do not wear sandals.  If you use a stepladder:  Make sure that it is fully opened. Do not climb a closed stepladder.  Make sure that both sides of the stepladder are locked into place.  Ask someone to hold it for you, if possible.  Clearly mark and make sure that you can see:  Any grab bars or handrails.  First and last steps.  Where the edge of each step is.  Use tools that help you move around (mobility aids) if they are needed. These include:  Canes.  Walkers.  Scooters.  Crutches.  Turn on the lights when you go into a dark area. Replace any light bulbs as soon as they burn out.  Set up your furniture so you have a clear path. Avoid moving your furniture around.  If any of your floors are uneven, fix them.  If there are any pets around you, be aware of where they are.  Review your medicines with your doctor. Some medicines can make you feel dizzy. This can increase your chance of falling. Ask your doctor what other things that you can do to help prevent falls. This information is not intended to replace advice given to you by your health care provider. Make sure you discuss any questions you have with your health care provider. Document Released: 06/10/2009 Document Revised: 01/20/2016 Document Reviewed: 09/18/2014 Elsevier Interactive Patient Education  2017 [employer].

## 2019-07-21 LAB — NOVEL CORONAVIRUS, NAA: SARS-CoV-2, NAA: NOT DETECTED

## 2019-08-04 DIAGNOSIS — G4700 Insomnia, unspecified: Secondary | ICD-10-CM | POA: Diagnosis not present

## 2019-08-04 DIAGNOSIS — J309 Allergic rhinitis, unspecified: Secondary | ICD-10-CM | POA: Diagnosis not present

## 2019-08-04 DIAGNOSIS — Z823 Family history of stroke: Secondary | ICD-10-CM | POA: Diagnosis not present

## 2019-08-04 DIAGNOSIS — Z7982 Long term (current) use of aspirin: Secondary | ICD-10-CM | POA: Diagnosis not present

## 2019-08-04 DIAGNOSIS — Z833 Family history of diabetes mellitus: Secondary | ICD-10-CM | POA: Diagnosis not present

## 2019-08-04 DIAGNOSIS — I10 Essential (primary) hypertension: Secondary | ICD-10-CM | POA: Diagnosis not present

## 2019-08-04 DIAGNOSIS — Z809 Family history of malignant neoplasm, unspecified: Secondary | ICD-10-CM | POA: Diagnosis not present

## 2019-08-04 DIAGNOSIS — Z8249 Family history of ischemic heart disease and other diseases of the circulatory system: Secondary | ICD-10-CM | POA: Diagnosis not present

## 2019-09-04 ENCOUNTER — Other Ambulatory Visit: Payer: Self-pay | Admitting: Family Medicine

## 2019-09-04 DIAGNOSIS — I129 Hypertensive chronic kidney disease with stage 1 through stage 4 chronic kidney disease, or unspecified chronic kidney disease: Secondary | ICD-10-CM

## 2019-09-04 DIAGNOSIS — I10 Essential (primary) hypertension: Secondary | ICD-10-CM

## 2019-09-21 ENCOUNTER — Telehealth: Payer: Self-pay | Admitting: Family Medicine

## 2019-09-21 DIAGNOSIS — F5104 Psychophysiologic insomnia: Secondary | ICD-10-CM

## 2019-09-21 NOTE — Telephone Encounter (Signed)
Requested medication (s) are due for refill today: yes  Requested medication (s) are on the active medication list: yes  Last refill:  03/26/19  Future visit scheduled: yes in 10 months  Notes to clinic:  Prescription expired 07/14/19   Requested Prescriptions  Pending Prescriptions Disp Refills   traZODone (DESYREL) 100 MG tablet [Pharmacy Med Name: TRAZODONE 100 MG TABLET] 90 tablet 1    Sig: Take 1 tablet (100 mg total) by mouth at bedtime as needed for sleep.      Psychiatry: Antidepressants - Serotonin Modulator Passed - 09/21/2019  9:32 AM      Passed - Valid encounter within last 6 months    Recent Outpatient Visits           2 months ago Respiratory illness   [place] [place], FNP   5 months ago Essential hypertension   [facility] Steele Sizer, MD   12 months ago Stage 3 chronic kidney disease [facility])   [facility] Steele Sizer, MD   1 year ago Stage 3 chronic kidney disease [facility])   [facility] Steele Sizer, MD   1 year ago Weakness   [place], FNP       Future Appointments             In 10 months [facility]

## 2019-09-23 NOTE — Telephone Encounter (Signed)
Pt is scheduled °

## 2019-10-06 ENCOUNTER — Ambulatory Visit: Payer: Medicare HMO | Attending: Internal Medicine

## 2019-10-06 DIAGNOSIS — Z23 Encounter for immunization: Secondary | ICD-10-CM

## 2019-10-06 NOTE — Progress Notes (Signed)
   Covid-19 Vaccination Clinic  Name:  Frederick Nelson    MRN: [id number] DOB: 12/20/1950  10/06/2019  [name] was observed post Covid-19 immunization for 15 minutes without incidence. He was provided with Vaccine Information Sheet and instruction to access the V-Safe system.   [name] was instructed to call 911 with any severe reactions post vaccine: Marland Kitchen Difficulty breathing  . Swelling of your face and throat  . A fast heartbeat  . A bad rash all over your body  . Dizziness and weakness    Immunizations Administered    Name Date Dose VIS Date Route   Moderna COVID-19 Vaccine 10/06/2019  2:58 PM 0.5 mL 07/29/2019 Intramuscular   Manufacturer: Moderna   Lot: [id number]   [id number]

## 2019-10-15 ENCOUNTER — Ambulatory Visit (INDEPENDENT_AMBULATORY_CARE_PROVIDER_SITE_OTHER): Payer: Medicare HMO | Admitting: Family Medicine

## 2019-10-15 ENCOUNTER — Other Ambulatory Visit: Payer: Self-pay

## 2019-10-15 ENCOUNTER — Encounter: Payer: Self-pay | Admitting: Family Medicine

## 2019-10-15 VITALS — BP 128/74 | HR 62 | Temp 97.3°F | Resp 16 | Ht 71.0 in | Wt 195.6 lb

## 2019-10-15 DIAGNOSIS — F5104 Psychophysiologic insomnia: Secondary | ICD-10-CM | POA: Diagnosis not present

## 2019-10-15 DIAGNOSIS — M25551 Pain in right hip: Secondary | ICD-10-CM

## 2019-10-15 DIAGNOSIS — A6002 Herpesviral infection of other male genital organs: Secondary | ICD-10-CM

## 2019-10-15 DIAGNOSIS — E7800 Pure hypercholesterolemia, unspecified: Secondary | ICD-10-CM | POA: Diagnosis not present

## 2019-10-15 DIAGNOSIS — N529 Male erectile dysfunction, unspecified: Secondary | ICD-10-CM | POA: Diagnosis not present

## 2019-10-15 DIAGNOSIS — M25552 Pain in left hip: Secondary | ICD-10-CM

## 2019-10-15 DIAGNOSIS — I10 Essential (primary) hypertension: Secondary | ICD-10-CM | POA: Diagnosis not present

## 2019-10-15 DIAGNOSIS — I129 Hypertensive chronic kidney disease with stage 1 through stage 4 chronic kidney disease, or unspecified chronic kidney disease: Secondary | ICD-10-CM | POA: Diagnosis not present

## 2019-10-15 DIAGNOSIS — G8929 Other chronic pain: Secondary | ICD-10-CM

## 2019-10-15 DIAGNOSIS — R739 Hyperglycemia, unspecified: Secondary | ICD-10-CM | POA: Diagnosis not present

## 2019-10-15 DIAGNOSIS — R69 Illness, unspecified: Secondary | ICD-10-CM | POA: Diagnosis not present

## 2019-10-15 DIAGNOSIS — N183 Chronic kidney disease, stage 3 unspecified: Secondary | ICD-10-CM | POA: Diagnosis not present

## 2019-10-15 DIAGNOSIS — N1831 Chronic kidney disease, stage 3a: Secondary | ICD-10-CM

## 2019-10-15 MED ORDER — SILDENAFIL CITRATE 100 MG PO TABS: 50.0000 mg | ORAL_TABLET | Freq: Every day | ORAL | 1 refills | Status: DC | PRN

## 2019-10-15 MED ORDER — VALSARTAN-HYDROCHLOROTHIAZIDE 80-12.5 MG PO TABS: 1.0000 | ORAL_TABLET | Freq: Every day | ORAL | 1 refills | Status: DC

## 2019-10-15 MED ORDER — TRAZODONE HCL 100 MG PO TABS: 100.0000 mg | ORAL_TABLET | Freq: Every evening | ORAL | 1 refills | Status: DC | PRN

## 2019-10-15 MED ORDER — VALACYCLOVIR HCL 1 G PO TABS: 1000.0000 mg | ORAL_TABLET | Freq: Every day | ORAL | 3 refills | Status: DC

## 2019-10-15 NOTE — Progress Notes (Signed)
Name: Frederick Nelson   MRN: [id number]    DOB: Nov 22, 1950   Date:10/15/2019       Progress Note  Subjective  Chief Complaint  Chief Complaint  Patient presents with  . Medication Refill  . Hypertension  . CKI stage 3  . Erectile Dysfunction  . Herpes genitalis  . Bilateral hip pain    HPI  HTN:no chest pain or palpitation, no SOB, he is on low dose of valsartan/hctz 80/12.5,  bp is well controlled and he denies dizziness. He had angioedema while on ACE but he states had other allergies at the time and is doing well on valsartan   CKI stage 3: stable GFR it was 48 on his last lab drawn July 2020, normal urine output. No pruritus. We will recheck labs once a year   Bilateral hip pain: he has a long history of hip pain, seen by [facility]. Dr. Catalina Antigua and states first right hip injection really improved the symptoms but pain increased again, usually triggered by activity. Had repeat injection last Summer also. He states not having a lot of pain lately because not as active lately. He buys houses and flips   ED: he needs a refill of Viagra, we will [facility] to [facility] with [facility] voucher  Herpes genitalis: he is taking prophylactic medication and no recent episodes.  Pre-diabetes: he has a history of elevated glucose, last A1C was 6%, discussed a low carbohydrate diet  Patient Active Problem List   Diagnosis Date Noted  . Sinus bradycardia 06/19/2018  . Hyperlipidemia 11/02/2017  . 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 Erectile dysfunction 08/09/2016  . Vitamin D deficiency 08/09/2016  . Blurred vision, bilateral 04/27/2016  . 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 Benign hypertension with chronic kidney disease, stage III ([place]) 01/26/2016  . Chronic insomnia 01/26/2016  . Bilateral leg edema 01/26/2016  . History of colon cancer 06/10/2015    Past Surgical History:  Procedure Laterality Date  . COLON SURGERY  06/26/2007   right  hemicolectomy-T2N0 carcinoma of right colon. The tumor was 2.6 cm,low grade with zero of 14 nodes positive. No evidence of venous or lymphatic invasion  . COLONOSCOPY  [id number]  . COLONOSCOPY WITH PROPOFOL N/A 08/04/2015   Procedure: COLONOSCOPY WITH PROPOFOL;  Surgeon: [name], MD;  [facility] END[facility]COPY;  Service: Endoscopy;  Laterality: N/A;    Family History  Problem Relation Age of Onset  . Cancer Brother        cancer of the thymus  . Diabetes Mother   . Hypertension Father   . Stroke Father   . Cancer Maternal Aunt        breast cancer    Social History   Tobacco Use  . Smoking status: Never Smoker  . Smokeless tobacco: Never Used  Substance Use Topics  . Alcohol use: Yes    Alcohol/week: 1.0 - 2.0 standard drinks    Types: 1 - 2 Standard drinks or equivalent per week    Comment: Socially only - Very little  . Drug use: No     Current Outpatient Medications:  .  aspirin EC 81 MG tablet, Take 81 mg by mouth daily., Disp: , Rfl:  .  loratadine (CLARITIN) 10 MG tablet, Take 1 tablet (10 mg total) by mouth daily., Disp: 90 tablet, Rfl: 1 .  sildenafil (VIAGRA) 100 MG tablet, Take 0.5-1 tablets (50-100 mg total) by mouth daily as needed for erectile dysfunction., Disp:  30 tablet, Rfl: 1 .  traZODone (DESYREL) 100 MG tablet, TAKE 1 TABLET (100 MG TOTAL) BY MOUTH AT BEDTIME AS NEEDED FOR SLEEP., Disp: 30 tablet, Rfl: 0 .  valACYclovir (VALTREX) 1000 MG tablet, Take 1 tablet (1,000 mg total) by mouth daily., Disp: 90 tablet, Rfl: 3 .  valsartan-hydrochlorothiazide (DIOVAN-HCT) 80-12.5 MG tablet, TAKE 1 TABLET BY MOUTH EVERY DAY, Disp: 90 tablet, Rfl: 0  Allergies  Allergen Reactions  . Ace Inhibitors Swelling  . Ciprofloxacin     Tendon damage    I personally reviewed active problem list, medication list, allergies, family history, social history, health maintenance with the patient/caregiver today.   ROS  Constitutional: Negative for fever or weight  change.  Respiratory: Negative for cough and shortness of breath.   Cardiovascular: Negative for chest pain or palpitations.  Gastrointestinal: Negative for abdominal pain, no bowel changes.  Musculoskeletal: Negative for gait problem or joint swelling.  Skin: Negative for rash.  Neurological: Negative for dizziness or headache.  No other specific complaints in a complete review of systems (except as listed in HPI above).  Objective  Vitals:   10/15/19 0924  BP: 128/74  Pulse: 62  Resp: 16  Temp: (!) 97.3 F (36.3 C)  TempSrc: Temporal  SpO2: 99%  Weight: 195 lb 9.6 oz (88.7 kg)  Height: 5\' 11"  (1.803 m)    Body mass index is 27.28 kg/m.  Physical Exam  Constitutional: Patient appears well-developed and well-nourished. Overweight.  No distress.  HEENT: head atraumatic, normocephalic, pupils equal and reactive to light Cardiovascular: Normal rate, regular rhythm and normal heart sounds.  No murmur heard. No BLE edema. Pulmonary/Chest: Effort normal and breath sounds normal. No respiratory distress. Abdominal: Soft.  There is no tenderness. Psychiatric: Patient has a normal mood and affect. behavior is normal. Judgment and thought content normal.  PHQ2/9: Depression screen [facility] 2/9 10/15/2019 07/18/2019 07/14/2019 03/26/2019 07/02/2018  Decreased Interest 0 0 0 0 0  Down, Depressed, Hopeless 0 0 0 0 0  PHQ - 2 Score 0 0 0 0 0  Altered sleeping 1 - 0 0 0  Tired, decreased energy 1 - 0 0 0  Change in appetite 0 - 0 0 0  Feeling bad or failure about yourself  0 - 0 0 0  Trouble concentrating 1 - 0 0 0  Moving slowly or fidgety/restless 0 - 0 0 0  Suicidal thoughts 0 - 0 0 0  PHQ-9 Score 3 - 0 0 0  Difficult doing work/chores Not difficult at all - Not difficult at all Not difficult at all Not difficult at all  Some recent data might be hidden    phq 9 is negative   Fall Risk: Fall Risk  10/15/2019 07/18/2019 07/14/2019 03/26/2019 09/26/2018  Falls in the past year? 0 0 0  0 0  Number falls in past yr: 0 0 0 0 0  Injury with Fall? 0 0 0 0 0  Follow up - Falls prevention discussed - - -      Functional Status Survey: Is the patient deaf or have difficulty hearing?: No Does the patient have difficulty seeing, even when wearing glasses/contacts?: Yes Does the patient have difficulty concentrating, remembering, or making decisions?: No Does the patient have difficulty walking or climbing stairs?: No Does the patient have difficulty dressing or bathing?: No Does the patient have difficulty doing errands alone such as visiting a doctor's office or shopping?: No    Assessment & Plan  1. Chronic insomnia  -  traZODone (DESYREL) 100 MG tablet; Take 1 tablet (100 mg total) by mouth at bedtime as needed for sleep.  Dispense: 90 tablet; Refill: 1  2. Essential hypertension  - valsartan-hydrochlorothiazide (DIOVAN-HCT) 80-12.5 MG tablet; Take 1 tablet by mouth daily.  Dispense: 90 tablet; Refill: 1  3. Benign hypertension with chronic kidney disease, stage III  - valsartan-hydrochlorothiazide (DIOVAN-HCT) 80-12.5 MG tablet; Take 1 tablet by mouth daily.  Dispense: 90 tablet; Refill: 1  4. Chronic hip pain, bilateral  Doing well at this time  5. Erectile dysfunction, unspecified erectile dysfunction type  - sildenafil (VIAGRA) 100 MG tablet; Take 0.5-1 tablets (50-100 mg total) by mouth daily as needed for erectile dysfunction.  Dispense: 30 tablet; Refill: 1  6. Pure hypercholesterolemia   7. Hyperglycemia  Discussed  low carb diet   8. Stage 3a chronic kidney disease   9. Herpes genitalis in men  - valACYclovir (VALTREX) 1000 MG tablet; Take 1 tablet (1,000 mg total) by mouth daily.  Dispense: 90 tablet; Refill: 3.

## 2019-11-05 ENCOUNTER — Ambulatory Visit: Payer: Medicare HMO | Attending: Internal Medicine

## 2019-11-05 DIAGNOSIS — Z23 Encounter for immunization: Secondary | ICD-10-CM

## 2019-11-05 NOTE — Progress Notes (Signed)
   Covid-19 Vaccination Clinic  Name:  Frederick Nelson    MRN: [id number] DOB: [birth year]  11/05/2019  Mr. Kilmer was observed post Covid-19 immunization for 30 minutes based on pre-vaccination screening without incident. He was provided with Vaccine Information Sheet and instruction to access the V-Safe system.   Mr. Palmberg was instructed to call 911 with any severe reactions post vaccine: Marland Kitchen Difficulty breathing  . Swelling of face and throat  . A fast heartbeat  . A bad rash all over body  . Dizziness and weakness   Immunizations Administered    Name Date Dose VIS Date Route   Moderna COVID-19 Vaccine 11/05/2019 11:29 AM 0.5 mL 07/29/2019 Intramuscular   Manufacturer: Moderna   Lot: [id number]   [id number]

## 2019-11-17 DIAGNOSIS — H524 Presbyopia: Secondary | ICD-10-CM | POA: Diagnosis not present

## 2020-03-04 ENCOUNTER — Telehealth: Payer: Self-pay | Admitting: Family Medicine

## 2020-03-04 NOTE — Chronic Care Management (AMB) (Signed)
Chronic Care Management   Note  03/04/2020 Name: BENZ VANDENBERGHE MRN: [id number] DOB: 1951-01-08  JULLIEN GRANQUIST is a 69 y.o. year old male who is a primary care patient of Steele Sizer, MD. I reached out to Jamse Belfast by phone today in response to a referral sent by Mr. Izayiah Tibbitts [facility] health plan.     Mr. Coltrane was given information about Chronic Care Management services today including:  1. CCM service includes personalized support from designated clinical staff supervised by his physician, including individualized plan of care and coordination with other care providers 2. 24/7 contact phone numbers for assistance for urgent and routine care needs. 3. Service will only be billed when office clinical staff spend 20 minutes or more in a month to coordinate care. 4. Only one practitioner may furnish and bill the service in a calendar month. 5. The patient may stop CCM services at any time (effective at the end of the month) by phone call to the office staff. 6. The patient will be responsible for cost sharing (co-pay) of up to 20% of the service fee (after annual deductible is met).  Patient agreed to services and verbal consent obtained.   Follow up plan: Telephone appointment with care management team member scheduled for:03/29/2020  Noreene Larsson, [address], [place], [place] [address] Direct Dial: [phone] [email or web address]

## 2020-03-29 ENCOUNTER — Ambulatory Visit: Payer: Medicare HMO

## 2020-03-30 NOTE — Chronic Care Management (AMB) (Signed)
  Chronic Care Management   Note   Name: Frederick Nelson MRN: [id number] DOB: [birth year]    Care Coordination Only: Medication: Frederick Nelson is currently taking trazadone 100mg  at bedtime to aid with sleep. Expressed concerns that the current dose is no longer effective. Would like to increase the dose if his PCP approves.    Follow up plan: -Will update Frederick Nelson regarding the request. -Frederick Nelson anticipates being available to complete the initial CCM assessment on 04/14/20.    [facility] Care Management [phone]

## 2020-04-13 NOTE — Progress Notes (Signed)
Patient: Frederick Nelson, Male    DOB: 11/15/1950, 69 y.o.   MRN: [id number]  Visit Date: 04/15/2020  Today's Provider: Loistine Chance, MD   Chief Complaint  Patient presents with  . Annual Exam    Subjective:   Frederick Nelson is a 69 y.o. male who presents today for his Subsequent Annual Wellness Visit.and follow up  Balance problems: he states that over the past few months he has noticed a change in his balance, no spinning sensation. He states he tried to flush out his ears initially , he was able to remove some wax from right side and improved symptoms Now episodes are only in am's when he first gets out of bed. He also states when he rolls in bed he has intermittent sensation of movement.   Insomnia: he is taking Trazodone 150 mg at night to help with his sleep, it is taking longer for him to feel steady in am's. He noticed dizziness after increasing dose of Trazodone, we will change medication. He took Ambien for years, we will try seroquel .   HTN:no chest pain or palpitation, no SOB, he is on low dose of valsartan/hctz 80/12.5,  bp is well controlled. He has noticed dizziness in am's, he fainted about two months ago. He got up from his bed and fell down, he woke up right away, no loss of consciousness, his bp was 106/69 - he states he took viagra the night before . He had angioedema while on ACE but he states had other allergies at the time and is doing well on valsartan   CKI stage 3: stable GFR it was 48 on his last lab drawn July 2020,normal urine output. No pruritus. We will recheck labs today   Bilateral hip pain: he has a long history of hip pain,seen by [facility]. Dr. 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   Herpes genitalis: he is taking prophylactic medication and no recent  episodes. Unchanged   Pre-diabetes: he has a history of elevated glucose, last A1C was 6%, discussed a low carbohydrate diet. We will recheck A1C    Patient/Caregiver input:      IPSS    Row Name 04/14/20 0900         International Prostate Symptom Score   How often have you had the sensation of not emptying your bladder? Less than half the time     How often have you had to urinate less than every two hours? Less than half the time     How often have you found you stopped and started again several times when you urinated? Less than 1 in 5 times     How often have you found it difficult to postpone urination? Almost always     How often have you had a weak urinary stream? Less than half the time     How often have you had to strain to start urination? Not at All     How many times did you typically get up at night to urinate? 3 Times  unless medication has been taken for sleeping     Total IPSS Score 15       Quality of Life due to urinary symptoms   If you were to spend the rest of your life  with your urinary condition just the way it is now how would you feel about that? Mostly Satisfied             Review of Systems Constitutional: Negative for fever or weight change.  Respiratory: Negative for cough and shortness of breath.   Cardiovascular: Negative for chest pain or palpitations.  Gastrointestinal: Negative for abdominal pain, no bowel changes.  Musculoskeletal: Negative for gait problem or joint swelling.  Skin: Negative for rash.  Neurological: positive for dizziness but no headache.  No other specific complaints in a complete review of systems (except as listed in HPI above).  Past Medical History:  Diagnosis Date  . Abdominal pain, right lower quadrant   . Allergy   . Arthritis   . Benign hypertension with chronic kidney disease, stage III ([place])   . Chronic low back pain   . Degenerative joint disease (DJD) of hip   . Genital herpes    Takes Valtrex for  prevention  . GERD (gastroesophageal reflux disease) 1995  . Hyperlipidemia   . Hypertension   . Malignant neoplasm of ascending colon ([place]) 2011  . Special screening for malignant neoplasms, colon 2011    Past Surgical History:  Procedure Laterality Date  . COLON SURGERY  06/26/2007   right hemicolectomy-T2N0 carcinoma of right colon. The tumor was 2.6 cm,low grade with zero of 14 nodes positive. No evidence of venous or lymphatic invasion  . COLONOSCOPY  [id number]  . COLONOSCOPY WITH PROPOFOL N/A 08/04/2015   Procedure: COLONOSCOPY WITH PROPOFOL;  Surgeon: [name], MD;  Location: [facility] ENDOSCOPY;  Service: Endoscopy;  Laterality: N/A;    Family History  Problem Relation Age of Onset  . Cancer Brother        cancer of the thymus  . Diabetes Mother   . Hypertension Father   . Stroke Father   . Cancer Maternal Aunt        breast cancer    Social History   Socioeconomic History  . Marital status: Divorced    Spouse name: Not on file  . Number of children: 1  . Years of education: 6  . Highest education level: Some college, no degree  Occupational History  . Not on file  Tobacco Use  . Smoking status: Never Smoker  . Smokeless tobacco: Never Used  Vaping Use  . Vaping Use: Never used  Substance and Sexual Activity  . Alcohol use: Yes    Alcohol/week: 1.0 - 2.0 standard drink    Types: 1 - 2 Standard drinks or equivalent per week    Comment: Socially only - Very little  . Drug use: No  . Sexual activity: Yes    Birth control/protection: None  Other Topics Concern  . Not on file  Social History Narrative  . Not on file   Social Determinants of Health   Financial Resource Strain: Low Risk   . Difficulty of Paying Living Expenses: Not hard at all  Food Insecurity: No Food Insecurity  . Worried About Charity fundraiser in the Last Year: Never true  . Ran Out of Food in the Last Year: Never true  Transportation Needs: No Transportation Needs  . Lack of  Transportation (Medical): No  . Lack of Transportation (Non-Medical): No  Physical Activity: Insufficiently Active  . Days of Exercise per Week: 3 days  . Minutes of Exercise per Session: 30 min  Stress: No Stress Concern Present  . Feeling of Stress : Not at all  Social Connections: Moderately Isolated  . Frequency of Communication with Friends and Family: More than three times a week  . Frequency of Social Gatherings with Friends and Family: Three times a week  . Attends Religious Services: Never  . Active Member of Clubs or Organizations: Yes  . Attends Archivist Meetings: 1 to 4 times per year  . Marital Status: Divorced  Human resources officer Violence: Not At Risk  . Fear of Current or Ex-Partner: No  . Emotionally Abused: No  . Physically Abused: No  . Sexually Abused: No    Outpatient Encounter Medications as of 04/14/2020  Medication Sig  . aspirin EC 81 MG tablet Take 81 mg by mouth daily.  Marland Kitchen loratadine (CLARITIN) 10 MG tablet Take 1 tablet (10 mg total) by mouth daily.  . QUEtiapine (SEROQUEL) 25 MG tablet Take 1 tablet (25 mg total) by mouth at bedtime.  . sildenafil (VIAGRA) 100 MG tablet Take 0.5-1 tablets (50-100 mg total) by mouth daily as needed for erectile dysfunction.  . valACYclovir (VALTREX) 1000 MG tablet Take 1 tablet (1,000 mg total) by mouth daily.  . valsartan-hydrochlorothiazide (DIOVAN-HCT) 80-12.5 MG tablet Take 1 tablet by mouth daily.  . [DISCONTINUED] traZODone (DESYREL) 100 MG tablet Take 1 tablet (100 mg total) by mouth at bedtime as needed for sleep.  . [DISCONTINUED] valsartan-hydrochlorothiazide (DIOVAN-HCT) 80-12.5 MG tablet Take 1 tablet by mouth daily.   No facility-administered encounter medications on file as of 04/14/2020.    Allergies  Allergen Reactions  . Ace Inhibitors Swelling  . Ciprofloxacin     Tendon damage    Care Team Updated in EHR: Yes  Health Maintenance  Topic Date Due  . Flu Shot  03/28/2020  . Colon Cancer  Screening  08/03/2020  . Tetanus Vaccine  02/01/2023  . COVID-19 Vaccine  Completed  .  Hepatitis C: One time screening is recommended by Center for Disease Control  (CDC) for  adults born from [birth year] through 1964.   Completed  . Pneumonia vaccines  Completed     Last Vision Exam: Done annually Wears corrective lenses: Yes Last Dental Exam: 03/2020 [facility] Last Hearing Exam:  Wears Hearing Aids: No  Functional Ability / Safety Screening 1.  Was the timed Get Up and Go test longer than 30 seconds?  yes 2.  Does the patient need help with the phone, transportation, shopping,      preparing meals, housework, laundry, medications, or managing money?  no 3.  Does the patient's home have:  loose throw rugs in the hallway?   no      Grab bars in the bathroom? yes      Handrails on the stairs?   yes      Poor lighting?   no 4.  Has the patient noticed any hearing difficulties?   no  Diet Recall and Exercise Regimen: none  Fall Risk:  See screening under Objective Information  Depression Screen: See screening under Objective Information  Advanced Directives: A voluntary discussion about advance care planning including the explanation and discussion of advance directives was discussed with the patient. Explanation about the health care proxy and living will was reviewed.  During this discussion, the patient was able to identify a health care proxy as daughter  and plans/does not plan to fill out the paperwork required and will bring this to our office to keep on file. Does patient have a HCPOA?    no If yes, name and contact information:  Does patient  have a living will or MOST form?  no  Cancer Screenings: Skin: discussed atypical lesions  Lung: Low Dose CT Chest recommended if Age 32-80 years, 30 pack-year currently smoking OR have quit w/in 15years. Patient does not qualify.  Lifestyle risk factor issued reviewed: Diet, exercise, weight management, advised patient smoking is not  healthy, nutrition/diet.   Prostate: 0.9 07/02/2018 Colon: 08/04/2015  Additional Screenings:  Hepatitis B/HIV/Syphillis: HIV Hepatitis C Screening: negative  Intimate Partner Violence: negative screen  AAA Screen: Men age 109 to 17 years if ever smoked recommended to get a one time AAA ultrasound screening exam. Patient does not qualify.  Objective:   Vitals: BP 122/78   Pulse 100   Ht 5\' 11"  (1.803 m)   Wt 198 lb (89.8 kg)   SpO2 99%   BMI 27.62 kg/m  Body mass index is 27.62 kg/m.  Lab Results  Component Value Date   CHOL 211 (H) 03/26/2019   CHOL 208 (H) 11/01/2017   CHOL 162 05/12/2016   Lab Results  Component Value Date   HDL 51 03/26/2019   HDL 60 11/01/2017   HDL 63 05/12/2016   Lab Results  Component Value Date   LDLCALC 137 (H) 03/26/2019   LDLCALC 129 (H) 11/01/2017   LDLCALC 80 05/12/2016   Lab Results  Component Value Date   TRIG 112 03/26/2019   TRIG 88 11/01/2017   TRIG 95 05/12/2016   Lab Results  Component Value Date   CHOLHDL 4.1 03/26/2019   CHOLHDL 3.5 11/01/2017   CHOLHDL 2.6 05/12/2016   No results found for: LDLDIRECT   Hearing Screening   125Hz  250Hz  500Hz  1000Hz  2000Hz  3000Hz  4000Hz  6000Hz  8000Hz   Right ear:           Left ear:           Vision Screening Comments: Gets annual exam  Dr Joya San  Physical Exam   Constitutional: Patient appears well-developed and well-nourished. No distress.  HENT: Head: Normocephalic and atraumatic. Ears: B TMs ok, no erythema or effusion; Nose: Nose normal. Mouth/Throat: not done Eyes: Conjunctivae and EOM are normal. Pupils are equal, round, and reactive to light. No scleral icterus.  Neck: Normal range of motion. Neck supple. No JVD present. No thyromegaly present.  Cardiovascular: Normal rate, regular rhythm and normal heart sounds.  No murmur heard. No BLE edema. Pulmonary/Chest: Effort normal and breath sounds normal. No respiratory distress. Abdominal: Soft. Bowel sounds are normal, no  distension. There is no tenderness. no masses MALE GENITALIA: Normal descended testes bilaterally, no masses palpated, no hernias, no lesions, no discharge RECTAL:enlarged prostate  no rectal masses or hemorrhoids Musculoskeletal: Normal range of motion, no joint effusions. No gross deformities Neurological: he is alert and oriented to person, place, and time. No cranial nerve deficit. Coordination, balance, strength, speech and gait are normal.  Skin: Skin is warm and dry. No rash noted. No erythema.  Psychiatric: Patient has a normal mood and affect. behavior is normal. Judgment and thought content normal.   Cognitive Testing - 6-CIT  Correct? Score   What year is it? yes 0 Yes = 0    No = 4  What month is it? yes 0 Yes = 0    No = 3  Remember:     Pia Mau, [place], [place]     What time is it? yes 0 Yes = 0    No = 3  Count backwards from 20 to 1 yes 0 Correct =  0    1 error = 2   More than 1 error = 4  Say the months of the year in reverse. yes 0 Correct = 0    1 error = 2   More than 1 error = 4  What address did I ask you to remember? yes 0 Correct = 0  1 error = 2    2 error = 4    3 error = 6    4 error = 8    All wrong = 10       TOTAL SCORE  0/28   Interpretation:  Normal  Normal (0-7) Abnormal (8-28)   Fall Risk: Fall Risk  04/14/2020 10/15/2019 07/18/2019 07/14/2019 03/26/2019  Falls in the past year? 1 0 0 0 0  Number falls in past yr: 0 0 0 0 0  Injury with Fall? 0 0 0 0 0  Follow up - - Falls prevention discussed - -    Depression Screen Depression screen [facility] 2/9 04/14/2020 10/15/2019 07/18/2019 07/14/2019 03/26/2019  Decreased Interest 0 0 0 0 0  Down, Depressed, Hopeless 0 0 0 0 0  PHQ - 2 Score 0 0 0 0 0  Altered sleeping 1 1 - 0 0  Tired, decreased energy 0 1 - 0 0  Change in appetite 0 0 - 0 0  Feeling bad or failure about yourself  0 0 - 0 0  Trouble concentrating 0 1 - 0 0  Moving slowly or fidgety/restless 0 0 - 0 0  Suicidal thoughts 0 0 - 0 0   PHQ-9 Score 1 3 - 0 0  Difficult doing work/chores - Not difficult at all - Not difficult at all Not difficult at all  Some recent data might be hidden    Recent Results (from the past 2160 hour(s))  CBC     Status: None   Collection Time: 04/14/20 10:44 AM  Result Value Ref Range   WBC 4.7 3.8 - 10.8 Thousand/uL   RBC 4.71 4.20 - 5.80 Million/uL   Hemoglobin 14.7 13.2 - 17.1 g/dL   HCT 44.0 38 - 50 %   MCV 93.4 80.0 - 100.0 fL   MCH 31.2 27.0 - 33.0 pg   MCHC 33.4 32.0 - 36.0 g/dL   RDW 12.6 11.0 - 15.0 %   Platelets 180 140 - 400 Thousand/uL   MPV 11.3 7.5 - 12.5 fL  COMPLETE METABOLIC PANEL WITH GFR     Status: Abnormal   Collection Time: 04/14/20 10:44 AM  Result Value Ref Range   Glucose, Bld 89 65 - 99 mg/dL    Comment: .            Fasting reference interval .    BUN 22 7 - 25 mg/dL   Creat 1.76 (H) 0.70 - 1.25 mg/dL    Comment: For patients >27 years of age, the reference limit for Creatinine is approximately 13% higher for people identified as African-American. .    GFR, Est Non African American 39 (L) > OR = 60 mL/min/1.50m2   GFR, Est African American 45 (L) > OR = 60 mL/min/1.82m2   BUN/Creatinine Ratio 13 6 - 22 (calc)   Sodium 140 135 - 146 mmol/L   Potassium 4.3 3.5 - 5.3 mmol/L   Chloride 103 98 - 110 mmol/L   CO2 28 20 - 32 mmol/L   Calcium 9.3 8.6 - 10.3 mg/dL   Total Protein 7.0 6.1 - 8.1 g/dL   Albumin  4.3 3.6 - 5.1 g/dL   Globulin 2.7 1.9 - 3.7 g/dL (calc)   AG Ratio 1.6 1.0 - 2.5 (calc)   Total Bilirubin 0.7 0.2 - 1.2 mg/dL   Alkaline phosphatase (APISO) 48 35 - 144 U/L   AST 17 10 - 35 U/L   ALT 17 9 - 46 U/L  TSH     Status: None   Collection Time: 04/14/20 10:44 AM  Result Value Ref Range   TSH 1.84 0.40 - 4.50 mIU/L     Assessment & Plan:     1. Encounter for Medicare annual wellness exam  - PSA - Lipid panel - CBC - COMPLETE METABOLIC PANEL WITH GFR - Hemoglobin A1c - TSH  2. Benign hypertension with chronic kidney  disease, stage III (HCC)  - valsartan-hydrochlorothiazide (DIOVAN-HCT) 80-12.5 MG tablet; Take 1 tablet by mouth daily.  Dispense: 90 tablet; Refill: 0  3. Erectile dysfunction, unspecified erectile dysfunction type   4. Pure hypercholesterolemia   5. Essential hypertension  - valsartan-hydrochlorothiazide (DIOVAN-HCT) 80-12.5 MG tablet; Take 1 tablet by mouth daily.  Dispense: 90 tablet; Refill: 0  6. Vitamin D deficiency   7. Dizziness  - Ambulatory referral to ENT  8. Stage 3a chronic kidney disease   9. Herpes genitalis in men   10. Primary insomnia  - QUEtiapine (SEROQUEL) 25 MG tablet; Take 1 tablet (25 mg total) by mouth at bedtime.  Dispense: 90 tablet; Refill: 0  11. Nocturia/BPH  - PSA  12. Colon cancer screening  - Ambulatory referral to Gastroenterology  Exercise Activities and Dietary recommendations Goals    . Patient Stated     Pt would like to remain active and complete renovation on house he is planning to buy. 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       Discussed health benefits of physical activity, and encouraged him to engage in regular exercise appropriate for his age and condition.   Immunization History  Administered Date(s) Administered  . Influenza, High Dose Seasonal PF 05/29/2017, 06/27/2018, 06/25/2019  . Influenza,inj,Quad PF,6+ Mos 06/18/2015, 05/11/2016  . Influenza-Unspecified 06/25/2019  . Moderna SARS-COVID-2 Vaccination 10/06/2019, 11/05/2019  . Pneumococcal Conjugate-13 05/29/2017  . Pneumococcal Polysaccharide-23 07/02/2018    Health Maintenance  Topic Date Due  . INFLUENZA VACCINE  03/28/2020  . COLONOSCOPY  08/03/2020  . TETANUS/TDAP  02/01/2023  . COVID-19 Vaccine  Completed  . Hepatitis C Screening  Completed  . PNA vac Low Risk Adult  Completed     Meds ordered this encounter  Medications  . valsartan-hydrochlorothiazide (DIOVAN-HCT) 80-12.5 MG tablet    Sig: Take 1 tablet by mouth daily.    Dispense:   90 tablet    Refill:  0  . QUEtiapine (SEROQUEL) 25 MG tablet    Sig: Take 1 tablet (25 mg total) by mouth at bedtime.    Dispense:  90 tablet    Refill:  0    Current Outpatient Medications:  .  aspirin EC 81 MG tablet, Take 81 mg by mouth daily., Disp: , Rfl:  .  loratadine (CLARITIN) 10 MG tablet, Take 1 tablet (10 mg total) by mouth daily., Disp: 90 tablet, Rfl: 1 .  QUEtiapine (SEROQUEL) 25 MG tablet, Take 1 tablet (25 mg total) by mouth at bedtime., Disp: 90 tablet, Rfl: 0 .  sildenafil (VIAGRA) 100 MG tablet, Take 0.5-1 tablets (50-100 mg total) by mouth daily as needed for erectile dysfunction., Disp: 30 tablet, Rfl: 1 .  valACYclovir (VALTREX) 1000 MG tablet, Take  1 tablet (1,000 mg total) by mouth daily., Disp: 90 tablet, Rfl: 3 .  valsartan-hydrochlorothiazide (DIOVAN-HCT) 80-12.5 MG tablet, Take 1 tablet by mouth daily., Disp: 90 tablet, Rfl: 0 Medications Discontinued During This Encounter  Medication Reason  . traZODone (DESYREL) 100 MG tablet Change in therapy  . valsartan-hydrochlorothiazide (DIOVAN-HCT) 80-12.5 MG tablet Reorder    I have personally reviewed and addressed the Medicare Annual Wellness health risk assessment questionnaire and have noted the following in the patient's chart:  A.         Medical and social history & family history B.         Use of alcohol, tobacco or illicit drugs  C.         Current medications and supplements D.         Functional and Cognitive ability and status E.         Nutritional status F.         Physical activity G.        Advance directives H.         List of other physicians I.          Hospitalizations, surgeries, and ER visits in previous 12 months J.         [place] such as hearing and vision if needed, cognitive and depression L.         Referrals and appointments - GI for colonoscopy and also ENT   In addition, I have reviewed and discussed with patient certain preventive protocols, quality  metrics, and best practice recommendations. A written personalized care plan for preventive services as well as general preventive health recommendations were provided to patient.   See attached scanned questionnaire for additional information.   Return in about 6 months (around 10/15/2020) for Follow up.     I,Shannon M Levens,acting as a scribe for Loistine Chance, MD.,have documented all relevant documentation on the behalf of Loistine Chance, MD,as directed by  Loistine Chance, MD while in the presence of Loistine Chance, MD.

## 2020-04-13 NOTE — Progress Notes (Deleted)
Name: Frederick Nelson   MRN: [id number]    DOB: [birth year]   Date:04/13/2020       Progress Note  Subjective  Chief Complaint  No chief complaint on file.   HPI  HTN:no chest pain or palpitation, no SOB, he is on low dose of valsartan/hctz 80/12.5,  bp is well controlled and he denies dizziness. He had angioedema while on ACE but he states had other allergies at the time and is doing well on valsartan   CKI stage 3: stable GFR it was 48 on his last lab drawn July 2020,normal urine output. No pruritus. We will recheck labs once a year   Bilateral hip pain: he has a long history of hip pain,seen by [facility]. Dr. Catalina Antigua and states first right hip injection really improved the symptoms but pain increased again, usually triggered by activity. Had repeat injection last Summer also. He states not having a lot of pain lately because not as active lately. He buys houses and flips   ED:he needs a refill of Viagra, we will send to [facility] with goodRx voucher  Herpes genitalis: he is taking prophylactic medication and no recent episodes.  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 The tumor was 2.6 cm,low grade with zero of 14 nodes positive. No evidence of venous or  lymphatic invasion   COLONOSCOPY  [id number]   COLONOSCOPY WITH PROPOFOL N/A 08/04/2015   Procedure: COLONOSCOPY WITH PROPOFOL;  Surgeon: [name], MD;  Location: [facility] ENDOSCOPY;  Service: Endoscopy;  Laterality: N/A;    Family History  Problem Relation Age of Onset   Cancer Brother        cancer of the thymus   Diabetes Mother    Hypertension Father    Stroke Father    Cancer Maternal Aunt        breast cancer    Social History   Tobacco Use   Smoking status: Never Smoker   Smokeless tobacco: Never Used  Substance Use Topics   Alcohol use: Yes    Alcohol/week: 1.0 - 2.0 standard drink    Types: 1 - 2 Standard drinks or equivalent per week    Comment: Socially only - Very little     Current Outpatient Medications:    aspirin EC 81 MG tablet, Take 81 mg by mouth daily., Disp: , Rfl:    loratadine (CLARITIN) 10 MG tablet, Take 1 tablet (10 mg total) by mouth daily., Disp: 90 tablet, Rfl: 1   sildenafil (VIAGRA) 100 MG tablet, Take 0.5-1 tablets (50-100 mg total) by mouth daily as needed for erectile dysfunction., Disp: 30 tablet, Rfl: 1   traZODone (DESYREL) 100 MG tablet, Take 1 tablet (100 mg total) by mouth at bedtime as needed for sleep., Disp: 90 tablet, Rfl: 1   valACYclovir (  VALTREX) 1000 MG tablet, Take 1 tablet (1,000 mg total) by mouth daily., Disp: 90 tablet, Rfl: 3   valsartan-hydrochlorothiazide (DIOVAN-HCT) 80-12.5 MG tablet, Take 1 tablet by mouth daily., Disp: 90 tablet, Rfl: 1  Allergies  Allergen Reactions   Ace Inhibitors Swelling   Ciprofloxacin     Tendon damage    I personally reviewed {Reviewed:14835} with the patient/caregiver today.   ROS  ***  Objective  There were no vitals filed for this visit.  There is no height or weight on file to calculate BMI.  Physical Exam ***  No results found for this or any previous visit (from the past 2160 hour(s)).  Diabetic Foot Exam: Diabetic Foot Exam - Simple   No  data filed     ***  PHQ2/9: Depression screen [facility] 2/9 10/15/2019 07/18/2019 07/14/2019 03/26/2019 07/02/2018  Decreased Interest 0 0 0 0 0  Down, Depressed, Hopeless 0 0 0 0 0  PHQ - 2 Score 0 0 0 0 0  Altered sleeping 1 - 0 0 0  Tired, decreased energy 1 - 0 0 0  Change in appetite 0 - 0 0 0  Feeling bad or failure about yourself  0 - 0 0 0  Trouble concentrating 1 - 0 0 0  Moving slowly or fidgety/restless 0 - 0 0 0  Suicidal thoughts 0 - 0 0 0  PHQ-9 Score 3 - 0 0 0  Difficult doing work/chores Not difficult at all - Not difficult at all Not difficult at all Not difficult at all  Some recent data might be hidden    phq 9 is {gen pos JIZ:128118} ***  Fall Risk: Fall Risk  10/15/2019 07/18/2019 07/14/2019 03/26/2019 09/26/2018  Falls in the past year? 0 0 0 0 0  Number falls in past yr: 0 0 0 0 0  Injury with Fall? 0 0 0 0 0  Follow up - Falls prevention discussed - - -   ***   Functional Status Survey:   ***   Assessment & Plan  *** There are no diagnoses linked to this encounter.

## 2020-04-14 ENCOUNTER — Telehealth: Payer: Self-pay

## 2020-04-14 ENCOUNTER — Other Ambulatory Visit: Payer: Self-pay

## 2020-04-14 ENCOUNTER — Encounter: Payer: Self-pay | Admitting: Family Medicine

## 2020-04-14 ENCOUNTER — Ambulatory Visit (INDEPENDENT_AMBULATORY_CARE_PROVIDER_SITE_OTHER): Payer: Medicare HMO | Admitting: Family Medicine

## 2020-04-14 VITALS — BP 122/78 | HR 100 | Ht 71.0 in | Wt 198.0 lb

## 2020-04-14 DIAGNOSIS — N529 Male erectile dysfunction, unspecified: Secondary | ICD-10-CM | POA: Diagnosis not present

## 2020-04-14 DIAGNOSIS — Z0000 Encounter for general adult medical examination without abnormal findings: Secondary | ICD-10-CM | POA: Diagnosis not present

## 2020-04-14 DIAGNOSIS — Z1211 Encounter for screening for malignant neoplasm of colon: Secondary | ICD-10-CM

## 2020-04-14 DIAGNOSIS — A6002 Herpesviral infection of other male genital organs: Secondary | ICD-10-CM

## 2020-04-14 DIAGNOSIS — E559 Vitamin D deficiency, unspecified: Secondary | ICD-10-CM

## 2020-04-14 DIAGNOSIS — R351 Nocturia: Secondary | ICD-10-CM

## 2020-04-14 DIAGNOSIS — N183 Chronic kidney disease, stage 3 unspecified: Secondary | ICD-10-CM

## 2020-04-14 DIAGNOSIS — R42 Dizziness and giddiness: Secondary | ICD-10-CM

## 2020-04-14 DIAGNOSIS — N1831 Chronic kidney disease, stage 3a: Secondary | ICD-10-CM | POA: Diagnosis not present

## 2020-04-14 DIAGNOSIS — I10 Essential (primary) hypertension: Secondary | ICD-10-CM | POA: Diagnosis not present

## 2020-04-14 DIAGNOSIS — I129 Hypertensive chronic kidney disease with stage 1 through stage 4 chronic kidney disease, or unspecified chronic kidney disease: Secondary | ICD-10-CM

## 2020-04-14 DIAGNOSIS — R69 Illness, unspecified: Secondary | ICD-10-CM | POA: Diagnosis not present

## 2020-04-14 DIAGNOSIS — F5101 Primary insomnia: Secondary | ICD-10-CM

## 2020-04-14 DIAGNOSIS — E7800 Pure hypercholesterolemia, unspecified: Secondary | ICD-10-CM | POA: Diagnosis not present

## 2020-04-14 LAB — COMPLETE METABOLIC PANEL WITH GFR
AG Ratio: 1.6 (calc) (ref 1.0–2.5)
ALT: 17 U/L (ref 9–46)
AST: 17 U/L (ref 10–35)
Albumin: 4.3 g/dL (ref 3.6–5.1)
Alkaline phosphatase (APISO): 48 U/L (ref 35–144)
BUN/Creatinine Ratio: 13 (calc) (ref 6–22)
BUN: 22 mg/dL (ref 7–25)
CO2: 28 mmol/L (ref 20–32)
Calcium: 9.3 mg/dL (ref 8.6–10.3)
Chloride: 103 mmol/L (ref 98–110)
Creat: 1.76 mg/dL — ABNORMAL HIGH (ref 0.70–1.25)
GFR, Est African American: 45 mL/min/{1.73_m2} — ABNORMAL LOW (ref >=60)
GFR, Est Non African American: 39 mL/min/{1.73_m2} — ABNORMAL LOW (ref >=60)
Globulin: 2.7 g/dL (calc) (ref 1.9–3.7)
Glucose, Bld: 89 mg/dL (ref 65–99)
Potassium: 4.3 mmol/L (ref 3.5–5.3)
Sodium: 140 mmol/L (ref 135–146)
Total Bilirubin: 0.7 mg/dL (ref 0.2–1.2)
Total Protein: 7 g/dL (ref 6.1–8.1)

## 2020-04-14 LAB — CBC
HCT: 44 % (ref 38.5–50.0)
Hemoglobin: 14.7 g/dL (ref 13.2–17.1)
MCH: 31.2 pg (ref 27.0–33.0)
MCHC: 33.4 g/dL (ref 32.0–36.0)
MCV: 93.4 fL (ref 80.0–100.0)
MPV: 11.3 fL (ref 7.5–12.5)
Platelets: 180 10*3/uL (ref 140–400)
RBC: 4.71 10*6/uL (ref 4.20–5.80)
RDW: 12.6 % (ref 11.0–15.0)
WBC: 4.7 10*3/uL (ref 3.8–10.8)

## 2020-04-14 LAB — TSH: TSH: 1.84 mIU/L (ref 0.40–4.50)

## 2020-04-14 MED ORDER — QUETIAPINE FUMARATE 25 MG PO TABS: 25.0000 mg | ORAL_TABLET | Freq: Every day | ORAL | 0 refills | Status: DC

## 2020-04-14 MED ORDER — VALSARTAN-HYDROCHLOROTHIAZIDE 80-12.5 MG PO TABS: 1.0000 | ORAL_TABLET | Freq: Every day | ORAL | 0 refills | Status: DC

## 2020-04-14 NOTE — Patient Instructions (Signed)
Preventive Care 68 Years and Older, Male Preventive care refers to lifestyle choices and visits with your health care provider that can promote health and wellness. This includes:  A yearly physical exam. This is also called an annual well check.  Regular dental and eye exams.  Immunizations.  Screening for certain conditions.  Healthy lifestyle choices, such as diet and exercise. What can I expect for my preventive care visit? Physical exam Your health care provider will check:  Height and weight. These may be used to calculate body mass index (BMI), which is a measurement that tells if you are at a healthy weight.  Heart rate and blood pressure.  Your skin for abnormal spots. Counseling Your health care provider may ask you questions about:  Alcohol, tobacco, and drug use.  Emotional well-being.  Home and relationship well-being.  Sexual activity.  Eating habits.  History of falls.  Memory and ability to understand (cognition).  Work and work Statistician. What immunizations do I need?  Influenza (flu) vaccine  This is recommended every year. Tetanus, diphtheria, and pertussis (Tdap) vaccine  You may need a Td booster every 10 years. Varicella (chickenpox) vaccine  You may need this vaccine if you have not already been vaccinated. Zoster (shingles) vaccine  You may need this after age 74. Pneumococcal conjugate (PCV13) vaccine  One dose is recommended after age 11. Pneumococcal polysaccharide (PPSV23) vaccine  One dose is recommended after age 27. Measles, mumps, and rubella (MMR) vaccine  You may need at least one dose of MMR if you were born in 1957 or later. You may also need a second dose. Meningococcal conjugate (MenACWY) vaccine  You may need this if you have certain conditions. Hepatitis A vaccine  You may need this if you have certain conditions or if you travel or work in places where you may be exposed to hepatitis A. Hepatitis B  vaccine  You may need this if you have certain conditions or if you travel or work in places where you may be exposed to hepatitis B. Haemophilus influenzae type b (Hib) vaccine  You may need this if you have certain conditions. You may receive vaccines as individual doses or as more than one vaccine together in one shot (combination vaccines). Talk with your health care provider about the risks and benefits of combination vaccines. What tests do I need? Blood tests  Lipid and cholesterol levels. These may be checked every 5 years, or more frequently depending on your overall health.  Hepatitis C test.  Hepatitis B test. Screening  Lung cancer screening. You may have this screening every year starting at age 72 if you have a 30-pack-year history of smoking and currently smoke or have quit within the past 15 years.  Colorectal cancer screening. All adults should have this screening starting at age 55 and continuing until age 22. Your health care provider may recommend screening at age 81 if you are at increased risk. You will have tests every 1-10 years, depending on your results and the type of screening test.  Prostate cancer screening. Recommendations will vary depending on your family history and other risks.  Diabetes screening. This is done by checking your blood sugar (glucose) after you have not eaten for a while (fasting). You may have this done every 1-3 years.  Abdominal aortic aneurysm (AAA) screening. You may need this if you are a current or former smoker.  Sexually transmitted disease (STD) testing. Follow these instructions at home: Eating and drinking  Eat  a diet that includes fresh fruits and vegetables, whole grains, lean protein, and low-fat dairy products. Limit your intake of foods with high amounts of sugar, saturated fats, and salt.  Take vitamin and mineral supplements as recommended by your health care provider.  Do not drink alcohol if your health care  provider tells you not to drink.  If you drink alcohol: ? Limit how much you have to 0-2 drinks a day. ? Be aware of how much alcohol is in your drink. In the [place] one drink equals one 12 oz bottle of beer (355 mL), one 5 oz glass of wine (148 mL), or one 1 oz glass of hard liquor (44 mL). Lifestyle  Take daily care of your teeth and gums.  Stay active. Exercise for at least 30 minutes on 5 or more days each week.  Do not use any products that contain nicotine or tobacco, such as cigarettes, e-cigarettes, and chewing tobacco. If you need help quitting, ask your health care provider.  If you are sexually active, practice safe sex. Use a condom or other form of protection to prevent STIs (sexually transmitted infections).  Talk with your health care provider about taking a low-dose aspirin or statin. What's next?  Visit your health care provider once a year for a well check visit.  Ask your health care provider how often you should have your eyes and teeth checked.  Stay up to date on all vaccines. This information is not intended to replace advice given to you by your health care provider. Make sure you discuss any questions you have with your health care provider. Document Revised: 08/08/2018 Document Reviewed: 08/08/2018 Elsevier Patient Education  2020 [employer].

## 2020-04-20 NOTE — Telephone Encounter (Signed)
°  Chronic Care Management   Outreach Note   Name: Frederick Nelson MRN: [id number] DOB: [birth year]  Primary Care Provider: Steele Sizer, MD Reason for referral : Chronic Care Management   An unsuccessful telephone outreach was attempted today. Frederick Nelson was referred to the case management team for assistance with care management and care coordination.     PLAN A member of the care management team will reach out to Frederick Nelson again within the next two weeks.   Cristy [facility] Care Management [facility] [phone]

## 2020-04-22 DIAGNOSIS — H8111 Benign paroxysmal vertigo, right ear: Secondary | ICD-10-CM | POA: Diagnosis not present

## 2020-04-22 DIAGNOSIS — H903 Sensorineural hearing loss, bilateral: Secondary | ICD-10-CM | POA: Diagnosis not present

## 2020-04-26 ENCOUNTER — Ambulatory Visit: Payer: Self-pay

## 2020-04-26 NOTE — Chronic Care Management (AMB) (Signed)
  Chronic Care Management   Outreach Note  04/26/2020 Name: Frederick Nelson MRN: [id number] DOB: 10-05-1950  Primary Care Provider: Steele Sizer, MD Reason for referral : Chronic Care Management    [name] was referred to the care management team for assistance with chronic care management and care coordination. His primary care provider will be notified of our unsuccessful attempts to establish and maintain contact. The care management team will gladly outreach at any time in the future if he is interested in receiving assistance.    PLAN The care management team will gladly follow up with [name] after the primary care provider has a conversation with him regarding recommendation for care management engagement and subsequent re-referral for care management services.    Cristy [facility] Care Management [facility] [phone]

## 2020-05-06 ENCOUNTER — Other Ambulatory Visit: Payer: Self-pay | Admitting: Family Medicine

## 2020-05-06 DIAGNOSIS — F5104 Psychophysiologic insomnia: Secondary | ICD-10-CM

## 2020-05-10 ENCOUNTER — Other Ambulatory Visit: Payer: Self-pay

## 2020-05-10 DIAGNOSIS — J302 Other seasonal allergic rhinitis: Secondary | ICD-10-CM

## 2020-05-11 MED ORDER — LORATADINE 10 MG PO TABS: 10.0000 mg | ORAL_TABLET | Freq: Every day | ORAL | 1 refills | Status: DC

## 2020-05-12 DIAGNOSIS — H8111 Benign paroxysmal vertigo, right ear: Secondary | ICD-10-CM | POA: Diagnosis not present

## 2020-05-19 DIAGNOSIS — H8111 Benign paroxysmal vertigo, right ear: Secondary | ICD-10-CM | POA: Diagnosis not present

## 2020-05-19 DIAGNOSIS — H6980 Other specified disorders of Eustachian tube, unspecified ear: Secondary | ICD-10-CM | POA: Diagnosis not present

## 2020-06-08 ENCOUNTER — Other Ambulatory Visit: Payer: Self-pay | Admitting: Family Medicine

## 2020-06-08 ENCOUNTER — Telehealth: Payer: Self-pay | Admitting: Family Medicine

## 2020-06-08 NOTE — Telephone Encounter (Signed)
Patient called to ask that the doctor change his sleep medicine back to the Trazodone.  He stated that the medication he is currently on is making him too lethargic and fall asleep a lot.  Please advise and send the trazodone to pharmacy if possible.  [phone]

## 2020-06-27 ENCOUNTER — Other Ambulatory Visit: Payer: Self-pay | Admitting: Family Medicine

## 2020-06-27 DIAGNOSIS — F5104 Psychophysiologic insomnia: Secondary | ICD-10-CM

## 2020-07-19 DIAGNOSIS — Z833 Family history of diabetes mellitus: Secondary | ICD-10-CM | POA: Diagnosis not present

## 2020-07-19 DIAGNOSIS — Z823 Family history of stroke: Secondary | ICD-10-CM | POA: Diagnosis not present

## 2020-07-19 DIAGNOSIS — I10 Essential (primary) hypertension: Secondary | ICD-10-CM | POA: Diagnosis not present

## 2020-07-19 DIAGNOSIS — G4700 Insomnia, unspecified: Secondary | ICD-10-CM | POA: Diagnosis not present

## 2020-07-19 DIAGNOSIS — Z8249 Family history of ischemic heart disease and other diseases of the circulatory system: Secondary | ICD-10-CM | POA: Diagnosis not present

## 2020-07-19 DIAGNOSIS — Z85038 Personal history of other malignant neoplasm of large intestine: Secondary | ICD-10-CM | POA: Diagnosis not present

## 2020-07-19 DIAGNOSIS — B009 Herpesviral infection, unspecified: Secondary | ICD-10-CM | POA: Diagnosis not present

## 2020-07-19 DIAGNOSIS — Z7982 Long term (current) use of aspirin: Secondary | ICD-10-CM | POA: Diagnosis not present

## 2020-07-19 DIAGNOSIS — N529 Male erectile dysfunction, unspecified: Secondary | ICD-10-CM | POA: Diagnosis not present

## 2020-07-19 DIAGNOSIS — J302 Other seasonal allergic rhinitis: Secondary | ICD-10-CM | POA: Diagnosis not present

## 2020-07-27 ENCOUNTER — Ambulatory Visit (INDEPENDENT_AMBULATORY_CARE_PROVIDER_SITE_OTHER): Payer: Medicare HMO

## 2020-07-27 ENCOUNTER — Other Ambulatory Visit: Payer: Self-pay

## 2020-07-27 VITALS — BP 118/72 | HR 69 | Temp 97.9°F | Resp 16 | Ht 71.0 in | Wt 195.8 lb

## 2020-07-27 DIAGNOSIS — Z0000 Encounter for general adult medical examination without abnormal findings: Secondary | ICD-10-CM

## 2020-07-27 DIAGNOSIS — Z23 Encounter for immunization: Secondary | ICD-10-CM

## 2020-07-27 NOTE — Patient Instructions (Signed)
[name] , Thank you for taking time to come for your Medicare Wellness Visit. I appreciate your ongoing commitment to your health goals. Please review the following plan we discussed and let me know if I can assist you in the future.   Screening recommendations/referrals: Colonoscopy: done 08/04/15. Scheduled for 08/17/20 Recommended yearly ophthalmology/optometry visit for glaucoma screening and checkup Recommended yearly dental visit for hygiene and checkup  Vaccinations: Influenza vaccine: done today Pneumococcal vaccine: done 07/02/18 Tdap vaccine: done 01/31/13 Shingles vaccine: Shingrix discussed. Please contact your pharmacy for coverage information.  Covid-19: done 10/06/19 & 11/05/19  Advanced directives: Please bring a copy of your health care power of attorney and living will to the office at your convenience once you have completed those documents.   Conditions/risks identified: Keep up the great work!  Next appointment: Follow up in one year for your annual wellness visit.   Preventive Care 48 Years and Older, Male Preventive care refers to lifestyle choices and visits with your health care provider that can promote health and wellness. What does preventive care include?  A yearly physical exam. This is also called an annual well check.  Dental exams once or twice a year.  Routine eye exams. Ask your health care provider how often you should have your eyes checked.  Personal lifestyle choices, including:  Daily care of your teeth and gums.  Regular physical activity.  Eating a healthy diet.  Avoiding tobacco and drug use.  Limiting alcohol use.  Practicing safe sex.  Taking low doses of aspirin every day.  Taking vitamin and mineral supplements as recommended by your health care provider. What happens during an annual well check? The services and screenings done by your health care provider during your annual well check will depend on your age, overall  health, lifestyle risk factors, and family history of disease. Counseling  Your health care provider may ask you questions about your:  Alcohol use.  Tobacco use.  Drug use.  Emotional well-being.  Home and relationship well-being.  Sexual activity.  Eating habits.  History of falls.  Memory and ability to understand (cognition).  Work and work Statistician. Screening  You may have the following tests or measurements:  Height, weight, and BMI.  Blood pressure.  Lipid and cholesterol levels. These may be checked every 5 years, or more frequently if you are over 42 years old.  Skin check.  Lung cancer screening. You may have this screening every year starting at age 19 if you have a 30-pack-year history of smoking and currently smoke or have quit within the past 15 years.  Fecal occult blood test (FOBT) of the stool. You may have this test every year starting at age 47.  Flexible sigmoidoscopy or colonoscopy. You may have a sigmoidoscopy every 5 years or a colonoscopy every 10 years starting at age 29.  Prostate cancer screening. Recommendations will vary depending on your family history and other risks.  Hepatitis C blood test.  Hepatitis B blood test.  Sexually transmitted disease (STD) testing.  Diabetes screening. This is done by checking your blood sugar (glucose) after you have not eaten for a while (fasting). You may have this done every 1-3 years.  Abdominal aortic aneurysm (AAA) screening. You may need this if you are a current or former smoker.  Osteoporosis. You may be screened starting at age 96 if you are at high risk. Talk with your health care provider about your test results, treatment options, and if necessary, the need  for more tests. Vaccines  Your health care provider may recommend certain vaccines, such as:  Influenza vaccine. This is recommended every year.  Tetanus, diphtheria, and acellular pertussis (Tdap, Td) vaccine. You may need a Td  booster every 10 years.  Zoster vaccine. You may need this after age 8.  Pneumococcal 13-valent conjugate (PCV13) vaccine. One dose is recommended after age 62.  Pneumococcal polysaccharide (PPSV23) vaccine. One dose is recommended after age 70. Talk to your health care provider about which screenings and vaccines you need and how often you need them. This information is not intended to replace advice given to you by your health care provider. Make sure you discuss any questions you have with your health care provider. Document Released: 09/10/2015 Document Revised: 05/03/2016 Document Reviewed: 06/15/2015 Elsevier Interactive Patient Education  2017 [employer] Prevention in the Home Falls can cause injuries. They can happen to people of all ages. There are many things you can do to make your home safe and to help prevent falls. What can I do on the outside of my home?  Regularly fix the edges of walkways and driveways and fix any cracks.  Remove anything that might make you trip as you walk through a door, such as a raised step or threshold.  Trim any bushes or trees on the path to your home.  Use bright outdoor lighting.  Clear any walking paths of anything that might make someone trip, such as rocks or tools.  Regularly check to see if handrails are loose or broken. Make sure that both sides of any steps have handrails.  Any raised decks and porches should have guardrails on the edges.  Have any leaves, snow, or ice cleared regularly.  Use sand or salt on walking paths during winter.  Clean up any spills in your garage right away. This includes oil or grease spills. What can I do in the bathroom?  Use night lights.  Install grab bars by the toilet and in the tub and shower. Do not use towel bars as grab bars.  Use non-skid mats or decals in the tub or shower.  If you need to sit down in the shower, use a plastic, non-slip stool.  Keep the floor dry. Clean up  any water that spills on the floor as soon as it happens.  Remove soap buildup in the tub or shower regularly.  Attach bath mats securely with double-sided non-slip rug tape.  Do not have throw rugs and other things on the floor that can make you trip. What can I do in the bedroom?  Use night lights.  Make sure that you have a light by your bed that is easy to reach.  Do not use any sheets or blankets that are too big for your bed. They should not hang down onto the floor.  Have a firm chair that has side arms. You can use this for support while you get dressed.  Do not have throw rugs and other things on the floor that can make you trip. What can I do in the kitchen?  Clean up any spills right away.  Avoid walking on wet floors.  Keep items that you use a lot in easy-to-reach places.  If you need to reach something above you, use a strong step stool that has a grab bar.  Keep electrical cords out of the way.  Do not use floor polish or wax that makes floors slippery. If you must use wax, use  non-skid floor wax.  Do not have throw rugs and other things on the floor that can make you trip. What can I do with my stairs?  Do not leave any items on the stairs.  Make sure that there are handrails on both sides of the stairs and use them. Fix handrails that are broken or loose. Make sure that handrails are as long as the stairways.  Check any carpeting to make sure that it is firmly attached to the stairs. Fix any carpet that is loose or worn.  Avoid having throw rugs at the top or bottom of the stairs. If you do have throw rugs, attach them to the floor with carpet tape.  Make sure that you have a light switch at the top of the stairs and the bottom of the stairs. If you do not have them, ask someone to add them for you. What else can I do to help prevent falls?  Wear shoes that:  Do not have high heels.  Have rubber bottoms.  Are comfortable and fit you well.  Are  closed at the toe. Do not wear sandals.  If you use a stepladder:  Make sure that it is fully opened. Do not climb a closed stepladder.  Make sure that both sides of the stepladder are locked into place.  Ask someone to hold it for you, if possible.  Clearly mark and make sure that you can see:  Any grab bars or handrails.  First and last steps.  Where the edge of each step is.  Use tools that help you move around (mobility aids) if they are needed. These include:  Canes.  Walkers.  Scooters.  Crutches.  Turn on the lights when you go into a dark area. Replace any light bulbs as soon as they burn out.  Set up your furniture so you have a clear path. Avoid moving your furniture around.  If any of your floors are uneven, fix them.  If there are any pets around you, be aware of where they are.  Review your medicines with your doctor. Some medicines can make you feel dizzy. This can increase your chance of falling. Ask your doctor what other things that you can do to help prevent falls. This information is not intended to replace advice given to you by your health care provider. Make sure you discuss any questions you have with your health care provider. Document Released: 06/10/2009 Document Revised: 01/20/2016 Document Reviewed: 09/18/2014 Elsevier Interactive Patient Education  2017 [employer].

## 2020-07-27 NOTE — Progress Notes (Signed)
Subjective:   Frederick Nelson is a 69 y.o. male who presents for Medicare Annual/Subsequent preventive examination.  Review of Systems     Cardiac Risk Factors include: advanced age (>10men, >43 women);hypertension;male gender     Objective:    Today's Vitals   07/27/20 0943  BP: 118/72  Pulse: 69  Resp: 16  Temp: 97.9 F (36.6 C)  TempSrc: Oral  SpO2: 100%  Weight: 195 lb 12.8 oz (88.8 kg)  Height: 5\' 11"  (1.803 m)   Body mass index is 27.31 kg/m.  Advanced Directives 07/27/2020 07/18/2019 03/16/2017 03/07/2017 12/08/2016 08/09/2016 05/11/2016  Does Patient Have a Medical Advance Directive? No No No No No No No  Would patient like information on creating a medical advance directive? No - Patient declined Yes (MAU/Ambulatory/Procedural Areas - Information given) - - - - No - patient declined information    Current Medications (verified) Outpatient Encounter Medications as of 07/27/2020  Medication Sig  . aspirin EC 81 MG tablet Take 81 mg by mouth daily.  Marland Kitchen loratadine (CLARITIN) 10 MG tablet Take 1 tablet (10 mg total) by mouth daily.  . QUEtiapine (SEROQUEL) 25 MG tablet Take 1 tablet (25 mg total) by mouth at bedtime.  . sildenafil (VIAGRA) 100 MG tablet Take 0.5-1 tablets (50-100 mg total) by mouth daily as needed for erectile dysfunction.  . valACYclovir (VALTREX) 1000 MG tablet Take 1 tablet (1,000 mg total) by mouth daily.  . valsartan-hydrochlorothiazide (DIOVAN-HCT) 80-12.5 MG tablet Take 1 tablet by mouth daily.   No facility-administered encounter medications on file as of 07/27/2020.    Allergies (verified) Ace inhibitors and Ciprofloxacin   History: Past Medical History:  Diagnosis Date  . Abdominal pain, right lower quadrant   . Allergy   . Arthritis   . Benign hypertension with chronic kidney disease, stage III ([place])   . Chronic low back pain   . Degenerative joint disease (DJD) of hip   . Genital herpes    Takes Valtrex for prevention  . GERD  (gastroesophageal reflux disease) 1995  . Hyperlipidemia   . Hypertension   . Malignant neoplasm of ascending colon ([place]) 2011  . Special screening for malignant neoplasms, colon 2011   Past Surgical History:  Procedure Laterality Date  . COLON SURGERY  06/26/2007   right hemicolectomy-T2N0 carcinoma of right colon. The tumor was 2.6 cm,low grade with zero of 14 nodes positive. No evidence of venous or lymphatic invasion  . COLONOSCOPY  [id number]  . COLONOSCOPY WITH PROPOFOL N/A 08/04/2015   Procedure: COLONOSCOPY WITH PROPOFOL;  Surgeon: [name], MD;  Location: [facility];  Service: Endoscopy;  Laterality: N/A;   Family History  Problem Relation Age of Onset  . Cancer Brother        cancer of the thymus  . Diabetes Mother   . Hypertension Father   . Stroke Father   . Cancer Maternal Aunt        breast cancer   Social History   Socioeconomic History  . Marital status: Divorced    Spouse name: Not on file  . Number of children: 1  . Years of education: 42  . Highest education level: Some college, no degree  Occupational History  . Not on file  Tobacco Use  . Smoking status: Never Smoker  . Smokeless tobacco: Never Used  Vaping Use  . Vaping Use: Never used  Substance and Sexual Activity  . Alcohol use: Yes    Alcohol/week: 1.0 - 2.0 standard  drink    Types: 1 - 2 Standard drinks or equivalent per week    Comment: Socially only - Very little  . Drug use: No  . Sexual activity: Yes    Birth control/protection: None  Other Topics Concern  . Not on file  Social History Narrative   Pt lives alone   Social Determinants of Health   Financial Resource Strain: Low Risk   . Difficulty of Paying Living Expenses: Not hard at all  Food Insecurity: No Food Insecurity  . Worried About Charity fundraiser in the Last Year: Never true  . Ran Out of Food in the Last Year: Never true  Transportation Needs: No Transportation Needs  . Lack of Transportation  (Medical): No  . Lack of Transportation (Non-Medical): No  Physical Activity: Insufficiently Active  . Days of Exercise per Week: 3 days  . Minutes of Exercise per Session: 40 min  Stress: No Stress Concern Present  . Feeling of Stress : Not at all  Social Connections: Moderately Isolated  . Frequency of Communication with Friends and Family: More than three times a week  . Frequency of Social Gatherings with Friends and Family: Three times a week  . Attends Religious Services: Never  . Active Member of Clubs or Organizations: Yes  . Attends Archivist Meetings: 1 to 4 times per year  . Marital Status: Divorced    Tobacco Counseling Counseling given: Not Answered   Clinical Intake:  Pre-visit preparation completed: Yes  Pain : No/denies pain     BMI - recorded: 27.31 Nutritional Status: BMI 25 -29 Overweight Nutritional Risks: None Diabetes: No  How often do you need to have someone help you when you read instructions, pamphlets, or other written materials from your doctor or pharmacy?: 1 - Never    Interpreter Needed?: No  Information entered by :: Clemetine Marker LPN   Activities of Daily Living In your present state of health, do you have any difficulty performing the following activities: 07/27/2020 10/15/2019  Hearing? Y N  Comment declines hearing aids -  Vision? N Y  Difficulty concentrating or making decisions? N N  Walking or climbing stairs? N N  Dressing or bathing? N N  Doing errands, shopping? N N  Preparing Food and eating ? N -  Using the Toilet? N -  In the past six months, have you accidently leaked urine? N -  Do you have problems with loss of bowel control? N -  Managing your Medications? N -  Managing your Finances? N -  Housekeeping or managing your Housekeeping? N -  Some recent data might be hidden    Patient Care Team: Steele Sizer, MD as PCP - General (Family Medicine) Bary Castilla, Forest Gleason, MD as Consulting Physician  (General Surgery) Clyde Canterbury, MD as Referring Physician (Otolaryngology)  Indicate any recent Medical Services you may have received from other than [facility] providers in the past year (date may be approximate).     Assessment:   This is a routine wellness examination for Jervon.  Hearing/Vision screen  Hearing Screening   125Hz  250Hz  500Hz  1000Hz  2000Hz  3000Hz  4000Hz  6000Hz  8000Hz   Right ear:           Left ear:           Comments: Pt c/o slight hearing loss in right ear; declines hearing aids   Vision Screening Comments: [facility] screen[facility]  Dietary issues and exercise activities discussed: Current Exercise  Habits: Home exercise routine, Type of exercise: walking, Time (Minutes): 45, Frequency (Times/Week): 3, Weekly Exercise (Minutes/Week): 135, Intensity: Mild, Exercise limited by: None identified  Goals    . Patient Stated     Pt would like to remain active and complete renovation on house he is planning to buy. 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       Depression Screen PHQ 2/9 Scores 07/27/2020 04/14/2020 10/15/2019 07/18/2019 07/14/2019 03/26/2019 07/02/2018  PHQ - 2 Score 3 0 0 0 0 0 0  PHQ- 9 Score 9 1 3  - 0 0 0    Fall Risk Fall Risk  07/27/2020 04/14/2020 10/15/2019 07/18/2019 07/14/2019  Falls in the past year? 1 1 0 0 0  Number falls in past yr: 0 0 0 0 0  Injury with Fall? 0 0 0 0 0  Risk for fall due to : Medication side effect - - - -  Follow up Falls prevention discussed - - Falls prevention discussed -    Any stairs in or around the home? Yes  If so, are there any without handrails? No  Home free of loose throw rugs in walkways, pet beds, electrical cords, etc? Yes  Adequate lighting in your home to reduce risk of falls? Yes   ASSISTIVE DEVICES UTILIZED TO PREVENT FALLS:  Life alert? No  Use of a cane, walker or w/c? No  Grab bars in the bathroom? Yes  Shower chair or bench in shower? No  Elevated toilet seat or a  handicapped toilet? Yes  TIMED UP AND GO:  Was the test performed? Yes .  Length of time to ambulate 10 feet: 5 sec.   Gait steady and fast without use of assistive device  Cognitive Function:     6CIT Screen 07/27/2020 07/18/2019 05/29/2017  What Year? 0 points 0 points 0 points  What month? 0 points 0 points 0 points  What time? 0 points 0 points 0 points  Count back from 20 0 points 0 points 0 points  Months in reverse 0 points 0 points 0 points  Repeat phrase 2 points 0 points 2 points  Total Score 2 0 2    Immunizations Immunization History  Administered Date(s) Administered  . Fluad Quad(high Dose 65+) 07/27/2020  . Influenza, High Dose Seasonal PF 05/29/2017, 06/27/2018, 06/25/2019  . Influenza,inj,Quad PF,6+ Mos 06/18/2015, 05/11/2016  . Influenza-Unspecified 06/25/2019  . Moderna SARS-COVID-2 Vaccination 10/06/2019, 11/05/2019  . Pneumococcal Conjugate-13 05/29/2017  . Pneumococcal Polysaccharide-23 07/02/2018  . Tdap 01/31/2013    TDAP status: Up to date   Flu Vaccine status: Completed at today's visit   Pneumococcal vaccine status: Up to date   Covid-19 vaccine status: Completed vaccines  Qualifies for Shingles Vaccine? Yes   Zostavax completed No   Shingrix Completed?: No.    Education has been provided regarding the importance of this vaccine. Patient has been advised to call insurance company to determine out of pocket expense if they have not yet received this vaccine. Advised may also receive vaccine at local pharmacy or Health Dept. Verbalized acceptance and understanding.  Screening Tests Health Maintenance  Topic Date Due  . COLONOSCOPY  08/03/2020  . TETANUS/TDAP  02/01/2023  . INFLUENZA VACCINE  Completed  . COVID-19 Vaccine  Completed  . Hepatitis C Screening  Completed  . PNA vac Low Risk Adult  Completed    Health Maintenance  There are no preventive care reminders to display for this patient.  Colorectal cancer screening: Completed  08/04/15.  Repeat every 5 years. Scheduled with Dr. Bary Castilla 08/17/20  Lung Cancer Screening: (Low Dose CT Chest recommended if Age 54-80 years, 30 pack-year currently smoking OR have quit w/in 15years.) does not qualify.    Additional Screening:  Hepatitis C Screening: does qualify; Completed 05/29/17  Vision Screening: Recommended annual ophthalmology exams for early detection of glaucoma and other disorders of the eye. Is the patient up to date with their annual eye exam?  Yes  Who is the provider or what is the name of the office in which the patient attends annual eye exams? [place] Screening: Recommended annual dental exams for proper oral hygiene  Community Resource Referral / Chronic Care Management: CRR required this visit?  No   CCM required this visit?  No      Plan:     I have personally reviewed and noted the following in the patient's chart:   . Medical and social history . Use of alcohol, tobacco or illicit drugs  . Current medications and supplements . Functional ability and status . Nutritional status . Physical activity . Advanced directives . List of other physicians . Hospitalizations, surgeries, and ER visits in previous 12 months . Vitals . Screenings to include cognitive, depression, and falls . Referrals and appointments  In addition, I have reviewed and discussed with patient certain preventive protocols, quality metrics, and best practice recommendations. A written personalized care plan for preventive services as well as general preventive health recommendations were provided to patient.     Clemetine Marker, LPN   [id number]   Nurse Notes: pt states seroquel causing irregular sleep pattern; able to fall asleep but difficulty staying asleep and feels like he is more "mellow" and has a harder time getting going in the morning. Pt has previously taken temazepam 30 mg and changed to trazodone 150 mg and felt it worked better than  seroquel. Pt advised to schedule appt prior to scheduled appt in Feb to discuss with Dr. Ancil Boozer.

## 2020-08-12 ENCOUNTER — Other Ambulatory Visit: Payer: Self-pay | Admitting: Family Medicine

## 2020-08-12 DIAGNOSIS — F5101 Primary insomnia: Secondary | ICD-10-CM

## 2020-08-12 DIAGNOSIS — N183 Chronic kidney disease, stage 3 unspecified: Secondary | ICD-10-CM

## 2020-08-12 DIAGNOSIS — I10 Essential (primary) hypertension: Secondary | ICD-10-CM

## 2020-08-12 NOTE — Telephone Encounter (Signed)
Requested medication (s) are due for refill today: Yes  Requested medication (s) are on the active medication list: Yes  Last refill:  04/14/20  Future visit scheduled: Yes  Notes to clinic:  See request.    Requested Prescriptions  Pending Prescriptions Disp Refills   QUEtiapine (SEROQUEL) 25 MG tablet [Pharmacy Med Name: QUETIAPINE FUMARATE 25 MG TAB] 90 tablet 0    Sig: TAKE 1 TABLET BY MOUTH EVERYDAY AT BEDTIME      Not Delegated - Psychiatry:  Antipsychotics - Second Generation (Atypical) - quetiapine Failed - 08/12/2020 10:29 AM      Failed - This refill cannot be delegated      Passed - ALT in normal range and within 180 days    ALT  Date Value Ref Range Status  04/14/2020 17 9 - 46 U/L Final          Passed - AST in normal range and within 180 days    AST  Date Value Ref Range Status  04/14/2020 17 10 - 35 U/L Final          Passed - Last BP in normal range    BP Readings from Last 1 Encounters:  07/27/20 118/72          Passed - Valid encounter within last 6 months    Recent Outpatient Visits           4 months ago Benign hypertension with chronic kidney disease, stage III [facility])   [facility] Steele Sizer, MD   10 months ago Chronic insomnia   [facility] Steele Sizer, MD   1 year ago Respiratory illness   [place] [place], FNP   1 year ago Essential hypertension   [facility] Steele Sizer, MD   1 year ago Stage 3 chronic kidney disease [facility])   [facility] Steele Sizer, MD       Future Appointments             In 2 months Steele Sizer, MD [facility], [place]   In 11 months  [facility], [facility]              Signed Prescriptions Disp Refills   valsartan-hydrochlorothiazide (DIOVAN-HCT) 80-12.5 MG tablet 90 tablet 0    Sig: TAKE 1 TABLET BY MOUTH EVERY DAY      Cardiovascular: ARB +  Diuretic Combos Failed - 08/12/2020 10:29 AM      Failed - Cr in normal range and within 180 days    Creat  Date Value Ref Range Status  04/14/2020 1.76 (H) 0.70 - 1.25 mg/dL Final    Comment:    For patients >69 years of age, the reference limit for Creatinine is approximately 13% higher for people identified as African-American. .           Passed - K in normal range and within 180 days    Potassium  Date Value Ref Range Status  04/14/2020 4.3 3.5 - 5.3 mmol/L Final          Passed - Na in normal range and within 180 days    Sodium  Date Value Ref Range Status  04/14/2020 140 135 - 146 mmol/L Final  01/26/2016 142 134 - 144 mmol/L Final          Passed - Ca in normal range and within 180 days    Calcium  Date Value Ref  Range Status  04/14/2020 9.3 8.6 - 10.3 mg/dL Final          Passed - Patient is not pregnant      Passed - Last BP in normal range    BP Readings from Last 1 Encounters:  07/27/20 118/72          Passed - Valid encounter within last 6 months    Recent Outpatient Visits           4 months ago Benign hypertension with chronic kidney disease, stage III [facility])   [facility] Steele Sizer, MD   10 months ago Chronic insomnia   [facility] Steele Sizer, MD   1 year ago Respiratory illness   [place], FNP   1 year ago Essential hypertension   [facility] Steele Sizer, MD   1 year ago Stage 3 chronic kidney disease [facility])   [facility] Steele Sizer, MD       Future Appointments             In 2 months Ancil Boozer, Drue Stager, MD [facility], [place]   In 11 months  [facility], [facility]

## 2020-08-17 ENCOUNTER — Other Ambulatory Visit: Payer: Self-pay | Admitting: General Surgery

## 2020-08-17 DIAGNOSIS — Z85038 Personal history of other malignant neoplasm of large intestine: Secondary | ICD-10-CM | POA: Diagnosis not present

## 2020-08-17 NOTE — Progress Notes (Signed)
Subjective:     Patient ID: Frederick Nelson is a 69 y.o. male.  HPI  The following portions of the patient's history were reviewed and updated as appropriate.  This an established patient is here today for: consult visit. He is here today to discuss having a colonoscopy, last in 2016. He states his bowels move daily, no bleeding.  Review of Systems  Constitutional: Negative for chills and fever.  Respiratory: Negative.  Negative for cough.   Cardiovascular: Negative.   Gastrointestinal: Negative for blood in stool and constipation.  Genitourinary:       Decreased urinary stream.   Urgent stools after salads.  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  Remains very active.     Chief Complaint  Patient presents with  . Follow-up     BP 122/60   Pulse 60   Temp 36.7 C (98 F)   Ht 180.3 cm (5\' 11" )   Wt 89.4 kg (197 lb)   SpO2 98%   BMI 27.48 kg/m       Past Medical History:  Diagnosis Date  . Arthritis   . Benign hypertension with chronic kidney disease, stage III ([facility])   . Chronic low back pain   . Degenerative joint disease (DJD) of hip   . Genital herpes   . GERD (gastroesophageal reflux disease)   . Hyperlipidemia   . Malignant neoplasm of ascending colon ([facility]) 05/27/2007   T2, N0 low-grade.          Past Surgical History:  Procedure Laterality Date  . COLONOSCOPY  08/04/2015   Negative.  . COLONOSCOPY  2008  . COLONOSCOPY  2011   Benign mucosal biopsy.  . hemicolectomy Right 06/26/2007       Social History          Socioeconomic History  . Marital status: Single    Spouse name: Not on file  . Number of children: Not on file  . Years of education: Not on file  . Highest education level: Not on file  Occupational History  . Not on file  Tobacco Use  . Smoking status: Never Smoker  .  Smokeless tobacco: Never Used  Substance and Sexual Activity  . Alcohol use: Yes  . Drug use: Never  . Sexual activity: Not on file  Other Topics Concern  . Not on file  Social History Narrative  . Not on file   Social Determinants of Health   Financial Resource Strain: Not on file  Food Insecurity: Not on file  Transportation Needs: Not on file            Allergies  Allergen Reactions  . Ace Inhibitors Swelling  . Ciprofloxacin Other (See Comments)    Tendon damage    Current Medications        Current Outpatient Medications  Medication Sig Dispense Refill  . aspirin 81 MG EC tablet Take 81 mg by mouth once daily    . loratadine (CLARITIN) 10 mg tablet Take 10 mg by mouth once daily  1  . QUEtiapine (SEROQUEL) 25 MG tablet Take 1 tablet by mouth nightly    . sildenafil, antihypertensive, (REVATIO) 20 mg tablet Take by mouth    . valACYclovir (VALTREX) 1000 MG tablet Take 1,000 mg by mouth once daily  3  . valsartan-hydrochlorothiazide (DIOVAN-HCT) 80-12.5 mg tablet once daily    .  polyethylene glycol (MIRALAX) powder One bottle for colonoscopy prep. Use as directed. 255 g 0  . traZODone (DESYREL) 50 MG tablet TAKE 1.5 TABLETS (75 MG TOTAL) BY MOUTH AT BEDTIME AS NEEDED FOR SLEEP. (Patient not taking: Reported on 08/17/2020)  0   No current facility-administered medications for this visit.           Family History  Problem Relation Age of Onset  . Diabetes Mother   . Stroke Father   . High blood pressure (Hypertension) Father   . Cancer Brother        thymus  . Breast cancer Maternal Aunt   . Colon cancer Neg Hx         Objective:   Physical Exam Constitutional:      Appearance: Normal appearance.  Cardiovascular:     Rate and Rhythm: Normal rate and regular rhythm.     Pulses: Normal pulses.     Heart sounds: Normal heart sounds.  Pulmonary:     Effort: Pulmonary effort is normal.     Breath sounds: Normal breath  sounds.  Musculoskeletal:     Cervical back: Neck supple.  Skin:    General: Skin is warm and dry.  Neurological:     Mental Status: He is alert and oriented to person, place, and time.  Psychiatric:        Mood and Affect: Mood normal.        Behavior: Behavior normal.    Labs and Radiology:    April 14, 2020: WBC 3.8 - 10.8 Thousand/uL 4.7   RBC 4.20 - 5.80 Million/uL 4.71   Hemoglobin 13.2 - 17.1 g/dL 14.7   HCT 38.5 - 50.0 % 44.0   MCV 80.0 - 100.0 fL 93.4   MCH 27.0 - 33.0 pg 31.2   MCHC 32.0 - 36.0 g/dL 33.4   RDW 11.0 - 15.0 % 12.6   Platelets 140 - 400 Thousand/uL 180   MPV 7.5 - 12.5 fL 11.3      Ref Range & Units 4 mo ago  Glucose, Bld 65 - 99 mg/dL 89   Comment: .       Fasting reference interval  .   BUN 7 - 25 mg/dL 22   Creat 0.70 - 1.25 mg/dL 1.76High   Comment: For patients >85 years of age, the reference limit  for Creatinine is approximately 13% higher for people  identified as African-American.  2020                                                                                                2016.  Creat 0.70 - 1.25 mg/dL 1.76High  1.69High CM  1.61High CM  1.51High CM  1.55High CM  1.49High R  1.56High     GFR, Est Non African American > OR = 60 mL/min/1.35m2 39Low   GFR, Est African American > OR = 60 mL/min/1.27m2 45Low   BUN/Creatinine Ratio 6 - 22 (calc) 13   Sodium 135 - 146 mmol/L 140   Potassium 3.5 - 5.3 mmol/L 4.3   Chloride 98 - 110 mmol/L 103  CO2 20 - 32 mmol/L 28   Calcium 8.6 - 10.3 mg/dL 9.3   Total Protein 6.1 - 8.1 g/dL 7.0   Albumin 3.6 - 5.1 g/dL 4.3   Globulin 1.9 - 3.7 g/dL (calc) 2.7   AG Ratio 1.0 - 2.5 (calc) 1.6   Total Bilirubin 0.2 - 1.2 mg/dL 0.7   Alkaline phosphatase (APISO) 35 - 144 U/L 48   AST 10 - 35 U/L 17   ALT 9 - 46 U/L 17        Assessment:     Candidate for screening colonoscopy.    Plan:     Indications for the procedure were reviewed.    Patient to  be scheduled for a colonoscopy on 09-01-20 at [facility]. The patient has been instructed on Miralax prep.   Patient to follow up as scheduled and is aware to call for any new issues or concerns.   Entered by Karie Fetch, RN, acting as a scribe for Dr. Hervey Ard, MD.  The documentation recorded by the scribe accurately reflects the service I personally performed and the decisions made by me.   [name], MD FACS

## 2020-08-30 ENCOUNTER — Other Ambulatory Visit: Payer: Self-pay

## 2020-08-30 ENCOUNTER — Other Ambulatory Visit
Admission: RE | Admit: 2020-08-30 | Discharge: 2020-08-30 | Disposition: A | Discharge status: Home / Self Care | Payer: Medicare HMO | Source: Ambulatory Visit | Attending: General Surgery | Admitting: General Surgery

## 2020-08-30 DIAGNOSIS — Z20822 Contact with and (suspected) exposure to covid-19: Secondary | ICD-10-CM | POA: Insufficient documentation

## 2020-08-30 DIAGNOSIS — Z01818 Encounter for other preprocedural examination: Secondary | ICD-10-CM | POA: Diagnosis not present

## 2020-08-31 ENCOUNTER — Encounter: Payer: Self-pay | Admitting: General Surgery

## 2020-08-31 LAB — SARS CORONAVIRUS 2 (TAT 6-24 HRS): SARS Coronavirus 2: NEGATIVE

## 2020-09-01 ENCOUNTER — Encounter: Payer: Self-pay | Admitting: General Surgery

## 2020-09-01 ENCOUNTER — Encounter
Admission: RE | Disposition: A | Discharge status: Home / Self Care | Payer: Self-pay | Source: Home / Self Care | Attending: General Surgery

## 2020-09-01 ENCOUNTER — Ambulatory Visit: Payer: Medicare HMO | Admitting: Anesthesiology

## 2020-09-01 ENCOUNTER — Other Ambulatory Visit: Payer: Self-pay

## 2020-09-01 ENCOUNTER — Ambulatory Visit
Admission: RE | Admit: 2020-09-01 | Discharge: 2020-09-01 | Disposition: A | Discharge status: Home / Self Care | Payer: Medicare HMO | Attending: General Surgery | Admitting: General Surgery

## 2020-09-01 DIAGNOSIS — D126 Benign neoplasm of colon, unspecified: Secondary | ICD-10-CM | POA: Diagnosis not present

## 2020-09-01 DIAGNOSIS — Z08 Encounter for follow-up examination after completed treatment for malignant neoplasm: Secondary | ICD-10-CM | POA: Diagnosis not present

## 2020-09-01 DIAGNOSIS — Z881 Allergy status to other antibiotic agents status: Secondary | ICD-10-CM | POA: Insufficient documentation

## 2020-09-01 DIAGNOSIS — K635 Polyp of colon: Secondary | ICD-10-CM | POA: Diagnosis not present

## 2020-09-01 DIAGNOSIS — Z8619 Personal history of other infectious and parasitic diseases: Secondary | ICD-10-CM | POA: Diagnosis not present

## 2020-09-01 DIAGNOSIS — Z1211 Encounter for screening for malignant neoplasm of colon: Secondary | ICD-10-CM | POA: Diagnosis not present

## 2020-09-01 DIAGNOSIS — Z79899 Other long term (current) drug therapy: Secondary | ICD-10-CM | POA: Diagnosis not present

## 2020-09-01 DIAGNOSIS — Z7982 Long term (current) use of aspirin: Secondary | ICD-10-CM | POA: Diagnosis not present

## 2020-09-01 DIAGNOSIS — E785 Hyperlipidemia, unspecified: Secondary | ICD-10-CM | POA: Diagnosis not present

## 2020-09-01 DIAGNOSIS — Z9049 Acquired absence of other specified parts of digestive tract: Secondary | ICD-10-CM | POA: Insufficient documentation

## 2020-09-01 DIAGNOSIS — D123 Benign neoplasm of transverse colon: Secondary | ICD-10-CM | POA: Insufficient documentation

## 2020-09-01 DIAGNOSIS — Z86010 Personal history of colonic polyps: Secondary | ICD-10-CM | POA: Diagnosis not present

## 2020-09-01 DIAGNOSIS — Z85038 Personal history of other malignant neoplasm of large intestine: Secondary | ICD-10-CM | POA: Diagnosis not present

## 2020-09-01 DIAGNOSIS — Z888 Allergy status to other drugs, medicaments and biological substances status: Secondary | ICD-10-CM | POA: Insufficient documentation

## 2020-09-01 DIAGNOSIS — G4700 Insomnia, unspecified: Secondary | ICD-10-CM | POA: Diagnosis not present

## 2020-09-01 DIAGNOSIS — Z980 Intestinal bypass and anastomosis status: Secondary | ICD-10-CM | POA: Diagnosis not present

## 2020-09-01 DIAGNOSIS — K219 Gastro-esophageal reflux disease without esophagitis: Secondary | ICD-10-CM | POA: Diagnosis not present

## 2020-09-01 HISTORY — PX: COLONOSCOPY WITH PROPOFOL: SHX5780

## 2020-09-01 SURGERY — COLONOSCOPY WITH PROPOFOL
Anesthesia: General

## 2020-09-01 MED ORDER — PROPOFOL 10 MG/ML IV BOLUS
INTRAVENOUS | Status: DC | PRN
  Administered 2020-09-01: 10 mg via INTRAVENOUS
  Administered 2020-09-01: 80 mg via INTRAVENOUS

## 2020-09-01 MED ORDER — PROPOFOL 500 MG/50ML IV EMUL
INTRAVENOUS | Status: DC | PRN
  Administered 2020-09-01: 150 ug/kg/min via INTRAVENOUS

## 2020-09-01 MED ORDER — PHENYLEPHRINE HCL (PRESSORS) 10 MG/ML IV SOLN
INTRAVENOUS | Status: AC
  Filled 2020-09-01: qty 1

## 2020-09-01 MED ORDER — SODIUM CHLORIDE 0.9 % IV SOLN: INTRAVENOUS | Status: DC

## 2020-09-01 MED ORDER — LIDOCAINE HCL (PF) 2 % IJ SOLN
INTRAMUSCULAR | Status: AC
  Filled 2020-09-01: qty 5

## 2020-09-01 MED ORDER — PROPOFOL 500 MG/50ML IV EMUL
INTRAVENOUS | Status: AC
  Filled 2020-09-01: qty 50

## 2020-09-01 MED ORDER — LIDOCAINE HCL (CARDIAC) PF 100 MG/5ML IV SOSY
PREFILLED_SYRINGE | INTRAVENOUS | Status: DC | PRN
  Administered 2020-09-01: 100 mg via INTRAVENOUS

## 2020-09-01 NOTE — Anesthesia Postprocedure Evaluation (Signed)
Anesthesia Post Note  Patient: Brasen L Raabe  Procedure(s) Performed: COLONOSCOPY WITH PROPOFOL (N/A )  Patient location during evaluation: Endoscopy Anesthesia Type: General Level of consciousness: awake and alert Pain management: pain level controlled Vital Signs Assessment: post-procedure vital signs reviewed and stable Respiratory status: spontaneous breathing, nonlabored ventilation, respiratory function stable and patient connected to nasal cannula oxygen Cardiovascular status: blood pressure returned to baseline and stable Postop Assessment: no apparent nausea or vomiting Anesthetic complications: no   No complications documented.   Last Vitals:  Vitals:   09/01/20 0920 09/01/20 0930  BP: (!) 149/79 114/89  Pulse: (!) 59 (!) 58  Resp: 15 16  Temp:    SpO2: 100% 100%    Last Pain:  Vitals:   09/01/20 0850  TempSrc: Temporal  PainSc:                  Cleda Mccreedy Burt Piatek

## 2020-09-01 NOTE — Anesthesia Preprocedure Evaluation (Signed)
Anesthesia Evaluation  Patient identified by MRN, date of birth, ID band Patient awake    Reviewed: Allergy & Precautions, H&P , NPO status , Patient's Chart, lab work & pertinent test results  History of Anesthesia Complications Negative for: history of anesthetic complications  Airway Mallampati: III  TM Distance: <3 FB Neck ROM: limited    Dental  (+) Chipped, Poor Dentition, Missing   Pulmonary neg pulmonary ROS, neg shortness of breath,    Pulmonary exam normal        Cardiovascular Exercise Tolerance: Good hypertension, Normal cardiovascular exam     Neuro/Psych negative neurological ROS  negative psych ROS   GI/Hepatic negative GI ROS, Neg liver ROS, GERD  ,  Endo/Other  negative endocrine ROS  Renal/GU Renal disease  negative genitourinary   Musculoskeletal   Abdominal   Peds  Hematology negative hematology ROS (+)   Anesthesia Other Findings Past Medical History: No date: Abdominal pain, right lower quadrant No date: Allergy No date: Arthritis No date: Benign hypertension with chronic kidney disease, stage III  (HCC) No date: Chronic low back pain No date: Degenerative joint disease (DJD) of hip No date: Genital herpes     Comment:  Takes Valtrex for prevention 1995: GERD (gastroesophageal reflux disease) No date: Hyperlipidemia No date: Hypertension 2011: Malignant neoplasm of ascending colon [facility]) 2011: Special screening for malignant neoplasms, colon  Past Surgical History: 06/26/2007: COLON SURGERY     Comment:  right hemicolectomy-T2N0 carcinoma of right colon. The               tumor was 2.6 cm,low grade with zero of 14 nodes               positive. No evidence of venous or lymphatic invasion [id number]: COLONOSCOPY 08/04/2015: COLONOSCOPY WITH PROPOFOL; N/A     Comment:  Procedure: COLONOSCOPY WITH PROPOFOL;  Surgeon: Earline Mayotte, MD;  Location: [facility] ENDOSCOPY;   Service:               Endoscopy;  Laterality: N/A;  BMI    Body Mass Index: 27.20 kg/m      Reproductive/Obstetrics negative OB ROS                             Anesthesia Physical Anesthesia Plan  ASA: III  Anesthesia Plan: General   Post-op Pain Management:    Induction: Intravenous  PONV Risk Score and Plan: Propofol infusion and TIVA  Airway Management Planned: Natural Airway and Nasal Cannula  Additional Equipment:   Intra-op Plan:   Post-operative Plan:   Informed Consent: I have reviewed the patients History and Physical, chart, labs and discussed the procedure including the risks, benefits and alternatives for the proposed anesthesia with the patient or authorized representative who has indicated his/her understanding and acceptance.     Dental Advisory Given  Plan Discussed with: Anesthesiologist, CRNA and Surgeon  Anesthesia Plan Comments: (Patient consented for risks of anesthesia including but not limited to:  - adverse reactions to medications - risk of airway placement if required - damage to eyes, teeth, lips or other oral mucosa - nerve damage due to positioning  - sore throat or hoarseness - Damage to heart, brain, nerves, lungs, other parts of body or loss of life  Patient voiced understanding.)        Anesthesia Quick Evaluation

## 2020-09-01 NOTE — H&P (Signed)
Frederick Nelson [id number] 1950/10/23     HPI:  Patient with prior colon cancer. For follow up colonoscopy. Tolerated prep well.   Medications Prior to Admission  Medication Sig Dispense Refill Last Dose  . aspirin EC 81 MG tablet Take 81 mg by mouth daily.   08/31/2020 at 2100  . loratadine (CLARITIN) 10 MG tablet Take 1 tablet (10 mg total) by mouth daily. 90 tablet 1 08/31/2020 at 2100  . valsartan-hydrochlorothiazide (DIOVAN-HCT) 80-12.5 MG tablet TAKE 1 TABLET BY MOUTH EVERY DAY 90 tablet 0 08/31/2020 at 2100  . QUEtiapine (SEROQUEL) 25 MG tablet TAKE 1 TABLET BY MOUTH EVERYDAY AT BEDTIME 90 tablet 0 08/30/2020  . sildenafil (VIAGRA) 100 MG tablet Take 0.5-1 tablets (50-100 mg total) by mouth daily as needed for erectile dysfunction. 30 tablet 1   . valACYclovir (VALTREX) 1000 MG tablet Take 1 tablet (1,000 mg total) by mouth daily. 90 tablet 3 08/30/2020   Allergies  Allergen Reactions  . Ace Inhibitors Swelling  . Ciprofloxacin     Tendon damage   Past Medical History:  Diagnosis Date  . Abdominal pain, right lower quadrant   . Allergy   . Arthritis   . Benign hypertension with chronic kidney disease, stage III ([place])   . Chronic low back pain   . Degenerative joint disease (DJD) of hip   . Genital herpes    Takes Valtrex for prevention  . GERD (gastroesophageal reflux disease) 1995  . Hyperlipidemia   . Hypertension   . Malignant neoplasm of ascending colon ([place]) 2011  . Special screening for malignant neoplasms, colon 2011   Past Surgical History:  Procedure Laterality Date  . COLON SURGERY  06/26/2007   right hemicolectomy-T2N0 carcinoma of right colon. The tumor was 2.6 cm,low grade with zero of 14 nodes positive. No evidence of venous or lymphatic invasion  . COLONOSCOPY  [id number]  . COLONOSCOPY WITH PROPOFOL N/A 08/04/2015   Procedure: COLONOSCOPY WITH PROPOFOL;  Surgeon: [name], MD;  Location: [facility] ENDOSCOPY;  Service: Endoscopy;  Laterality: N/A;   Social  History   Socioeconomic History  . Marital status: Divorced    Spouse name: Not on file  . Number of children: 1  . Years of education: 54  . Highest education level: Some college, no degree  Occupational History  . Not on file  Tobacco Use  . Smoking status: Never Smoker  . Smokeless tobacco: Never Used  Vaping Use  . Vaping Use: Never used  Substance and Sexual Activity  . Alcohol use: Yes    Alcohol/week: 1.0 - 2.0 standard drink    Types: 1 - 2 Standard drinks or equivalent per week    Comment: Socially only - Very little  . Drug use: No  . Sexual activity: Yes    Birth control/protection: None  Other Topics Concern  . Not on file  Social History Narrative   Pt lives alone   Social Determinants of Health   Financial Resource Strain: Low Risk   . Difficulty of Paying Living Expenses: Not hard at all  Food Insecurity: No Food Insecurity  . Worried About Charity fundraiser in the Last Year: Never true  . Ran Out of Food in the Last Year: Never true  Transportation Needs: No Transportation Needs  . Lack of Transportation (Medical): No  . Lack of Transportation (Non-Medical): No  Physical Activity: Insufficiently Active  . Days of Exercise per Week: 3 days  . Minutes of Exercise per Session:  40 min  Stress: No Stress Concern Present  . Feeling of Stress : Not at all  Social Connections: Moderately Isolated  . Frequency of Communication with Friends and Family: More than three times a week  . Frequency of Social Gatherings with Friends and Family: Three times a week  . Attends Religious Services: Never  . Active Member of Clubs or Organizations: Yes  . Attends Banker Meetings: 1 to 4 times per year  . Marital Status: Divorced  Catering manager Violence: Not At Risk  . Fear of Current or Ex-Partner: No  . Emotionally Abused: No  . Physically Abused: No  . Sexually Abused: No   Social History   Social History Narrative   Pt lives alone      ROS: Negative.     PE: HEENT: Negative. Lungs: Clear. Cardio: RR.   Assessment/Plan:  Proceed with planned endoscopy.   Merrily [facility] 09/01/2020

## 2020-09-01 NOTE — Op Note (Signed)
[facility] Gastroenterology Patient Name: Frederick Nelson Procedure Date: 09/01/2020 8:16 AM MRN: [id number] Account #: [id number] Date of Birth: Jul 29, 1951 Admit Type: Outpatient Age: 70 Room: [facility] ENDO ROOM 1 Gender: Male Note Status: Finalized Procedure:             Colonoscopy Indications:           High risk colon cancer surveillance: Personal history                         of colon cancer Providers:             [name], MD Referring MD:          Bethena Roys. Sowles, MD (Referring MD) Medicines:             Monitored Anesthesia Care Complications:         No immediate complications. Procedure:             Pre-Anesthesia Assessment:                        - Prior to the procedure, a History and Physical was                         performed, and patient medications, allergies and                         sensitivities were reviewed. The patient's tolerance                         of previous anesthesia was reviewed.                        - The risks and benefits of the procedure and the                         sedation options and risks were discussed with the                         patient. All questions were answered and informed                         consent was obtained.                        After obtaining informed consent, the colonoscope was                         passed under direct vision. Throughout the procedure,                         the patient's blood pressure, pulse, and oxygen                         saturations were monitored continuously. The                         Colonoscope was introduced through the anus and                         advanced to the the ileocolonic  anastomosis. The                         colonoscopy was performed without difficulty. The                         patient tolerated the procedure well. The quality of                         the bowel preparation was excellent. Findings:      A 6 mm polyp  was found in the hepatic flexure. The polyp was sessile.       Biopsies were taken with a cold forceps for histology. Completely       removed.      The retroflexed view of the distal rectum and anal verge was normal and       showed no anal or rectal abnormalities. Impression:            - One 6 mm polyp at the hepatic flexure. Biopsied.                        - The distal rectum and anal verge are normal on                         retroflexion view. Recommendation:        - Telephone endoscopist for pathology results in 1                         week.                        - Repeat colonoscopy in 5 years for surveillance. Procedure Code(s):     --- Professional ---                        [phone], Colonoscopy, flexible; with biopsy, single or                         multiple Diagnosis Code(s):     --- Professional ---                        [phone], Personal history of other malignant neoplasm                         of large intestine                        K63.5, Polyp of colon CPT copyright 2019 American Medical Association. All rights reserved. The codes documented in this report are preliminary and upon coder review may  be revised to meet current compliance requirements. Earline Mayotte, MD 09/01/2020 8:58:49 AM This report has been signed electronically. Number of Addenda: 0 Note Initiated On: 09/01/2020 8:16 AM Scope Withdrawal Time: 0 hours 12 minutes 52 seconds  Total Procedure Duration: 0 hours 16 minutes 47 seconds       [facility]

## 2020-09-01 NOTE — Transfer of Care (Signed)
Immediate Anesthesia Transfer of Care Note  Patient: Frederick Nelson  Procedure(s) Performed: COLONOSCOPY WITH PROPOFOL (N/A )  Patient Location: PACU  Anesthesia Type:General  Level of Consciousness: drowsy  Airway & Oxygen Therapy: Patient Spontanous Breathing  Post-op Assessment: Report given to RN and Post -op Vital signs reviewed and stable  Post vital signs: Reviewed and stable  Last Vitals:  Vitals Value Taken Time  BP 89/68 09/01/20 0858  Temp    Pulse 70 09/01/20 0858  Resp 15 09/01/20 0858  SpO2 98 % 09/01/20 0858  Vitals shown include unvalidated device data.  Last Pain:  Vitals:   09/01/20 0850  TempSrc: Temporal  PainSc:          Complications: No complications documented.

## 2020-09-02 LAB — SURGICAL PATHOLOGY

## 2020-10-11 ENCOUNTER — Other Ambulatory Visit: Payer: Self-pay | Admitting: Family Medicine

## 2020-10-11 DIAGNOSIS — F5101 Primary insomnia: Secondary | ICD-10-CM

## 2020-10-15 ENCOUNTER — Ambulatory Visit (INDEPENDENT_AMBULATORY_CARE_PROVIDER_SITE_OTHER): Payer: Medicare HMO | Admitting: Family Medicine

## 2020-10-15 ENCOUNTER — Encounter: Payer: Self-pay | Admitting: Family Medicine

## 2020-10-15 ENCOUNTER — Other Ambulatory Visit: Payer: Self-pay

## 2020-10-15 VITALS — BP 130/72 | HR 69 | Temp 98.1°F | Resp 16 | Ht 71.0 in | Wt 203.0 lb

## 2020-10-15 DIAGNOSIS — N183 Hypertensive chronic kidney disease with stage 1 through stage 4 chronic kidney disease, or unspecified chronic kidney disease: Secondary | ICD-10-CM

## 2020-10-15 DIAGNOSIS — R739 Hyperglycemia, unspecified: Secondary | ICD-10-CM | POA: Diagnosis not present

## 2020-10-15 DIAGNOSIS — N1831 Chronic kidney disease, stage 3a: Secondary | ICD-10-CM

## 2020-10-15 DIAGNOSIS — E7800 Pure hypercholesterolemia, unspecified: Secondary | ICD-10-CM | POA: Diagnosis not present

## 2020-10-15 DIAGNOSIS — I10 Essential (primary) hypertension: Secondary | ICD-10-CM

## 2020-10-15 DIAGNOSIS — I129 Hypertensive chronic kidney disease with stage 1 through stage 4 chronic kidney disease, or unspecified chronic kidney disease: Secondary | ICD-10-CM | POA: Diagnosis not present

## 2020-10-15 DIAGNOSIS — F5104 Psychophysiologic insomnia: Secondary | ICD-10-CM

## 2020-10-15 DIAGNOSIS — A6002 Herpesviral infection of other male genital organs: Secondary | ICD-10-CM

## 2020-10-15 DIAGNOSIS — E559 Vitamin D deficiency, unspecified: Secondary | ICD-10-CM | POA: Diagnosis not present

## 2020-10-15 DIAGNOSIS — R69 Illness, unspecified: Secondary | ICD-10-CM | POA: Diagnosis not present

## 2020-10-15 MED ORDER — VALSARTAN-HYDROCHLOROTHIAZIDE 80-12.5 MG PO TABS
1.0000 | ORAL_TABLET | Freq: Every day | ORAL | 1 refills | Status: DC
Start: 2020-10-15 — End: 2021-04-15

## 2020-10-15 MED ORDER — BELSOMRA 10 MG PO TABS: 1.0000 | ORAL_TABLET | Freq: Every evening | ORAL | 0 refills | Status: DC

## 2020-10-15 MED ORDER — VALACYCLOVIR HCL 1 G PO TABS: 1000.0000 mg | ORAL_TABLET | Freq: Every day | ORAL | 3 refills | Status: DC

## 2020-10-15 NOTE — Progress Notes (Signed)
Name: Frederick Nelson   MRN: [id number]    DOB: [birth year]   Date:10/15/2020       Progress Note  Subjective  Chief Complaint  Follow Up  HPI  Balance problems: he saw ENT , diagnosed with BPPV and treated with Epley maneuver and doing well since   Insomnia: he took Ambien for a while, followed by Trazodone and since August he is taking Seroquel, he states it is causing to feel sluggish and in a fog all day, it is also affecting his sex life.   HTN:he is taking valsartan/hctz 80/12.5,  bp is well controlled. He states 120's/70-78's, denies chest pain, palpitation or dizziness   CKI stage 3: : GFR was 45, not seeing nephrologist, we will recheck labs and add urine micro. pht and vitamin D levels, also HCT and refer to nephrologist if needed. Normal urine output. No pruritus. Discussed importance of avoiding NSAID's   Bilateral hip pain: he has a long history of hip pain,he was  by [facility]. Dr. Ernst Spell  and had a couple of steroid injections on right hip, he states first infection worked better than second. He states pain is not as severe, he states symptoms worse at the end of the day, or when lifting objects. Pain is described as dull pain and it can be from 2-7/10. He had to stop playing golf because it aggravated symptoms.   ED:he takes Viagra prn, he denies side effects. He states worse since started Seroquel for sleep   Herpes genitalis: he is taking prophylactic medication and no recent episodes. Unchanged   Pre-diabetes: he has a history of elevated glucose, last A1C was 6%, discussed a low carbohydrate diet. We will check A1C today since it was not done last week   Discussed may stop aspirin 81 mg since no previous history of heart attacks and strokes   Patient Active Problem List   Diagnosis Date Noted  . Sinus bradycardia 06/19/2018  . Hyperlipidemia 11/02/2017  . 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 Erectile dysfunction 08/09/2016  . Vitamin D  deficiency 08/09/2016  . Blurred vision, bilateral 04/27/2016  . 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 Benign hypertension with chronic kidney disease, stage III ([place]) 01/26/2016  . Chronic insomnia 01/26/2016  . Bilateral leg edema 01/26/2016  . History of colon cancer 06/10/2015    Past Surgical History:  Procedure Laterality Date  . COLON SURGERY  06/26/2007   right hemicolectomy-T2N0 carcinoma of right colon. The tumor was 2.6 cm,low grade with zero of 14 nodes positive. No evidence of venous or lymphatic invasion  . COLONOSCOPY  [id number]  . COLONOSCOPY WITH PROPOFOL N/A 08/04/2015   Procedure: COLONOSCOPY WITH PROPOFOL;  Surgeon: [name], MD;  [facility]: [facility] ENDOSCOPY;  Service: Endoscopy;  Laterality: N/A;  . COLONOSCOPY WITH PROPOFOL N/A 09/01/2020   Procedure: COLONOSCOPY WITH PROPOFOL;  Surgeon: [name], MD;  Location: [facility] ENDOSCOPY;  Service: Endoscopy;  Laterality: N/A;    Family History  Problem Relation Age of Onset  . Cancer Brother        cancer of the thymus  . Diabetes Mother   . Hypertension Father   . Stroke Father   . Cancer Maternal Aunt        breast cancer    Social History   Tobacco Use  . Smoking status: Never Smoker  . Smokeless tobacco: Never Used  Substance Use Topics  . Alcohol use: Yes    Alcohol/week: 1.0 -  2.0 standard drink    Types: 1 - 2 Standard drinks or equivalent per week    Comment: Socially only - Very little     Current Outpatient Medications:  .  aspirin EC 81 MG tablet, Take 81 mg by mouth daily., Disp: , Rfl:  .  loratadine (CLARITIN) 10 MG tablet, Take 1 tablet (10 mg total) by mouth daily., Disp: 90 tablet, Rfl: 1 .  QUEtiapine (SEROQUEL) 25 MG tablet, TAKE 1 TABLET BY MOUTH EVERYDAY AT BEDTIME, Disp: 90 tablet, Rfl: 1 .  sildenafil (VIAGRA) 100 MG tablet, Take 0.5-1 tablets (50-100 mg total) by mouth daily as needed for erectile dysfunction., Disp: 30 tablet, Rfl: 1 .  valACYclovir  (VALTREX) 1000 MG tablet, Take 1 tablet (1,000 mg total) by mouth daily., Disp: 90 tablet, Rfl: 3 .  valsartan-hydrochlorothiazide (DIOVAN-HCT) 80-12.5 MG tablet, TAKE 1 TABLET BY MOUTH EVERY DAY, Disp: 90 tablet, Rfl: 0 .  [employer] PURELAX 17 GM/SCOOP powder, Take by mouth. (Patient not taking: Reported on 10/15/2020), Disp: , Rfl:  .  polyethylene glycol powder (GLYCOLAX/MIRALAX) 17 GM/SCOOP powder, One bottle for colonoscopy prep. Use as directed. (Patient not taking: Reported on 10/15/2020), Disp: , Rfl:   Allergies  Allergen Reactions  . Ace Inhibitors Swelling  . Ciprofloxacin     Tendon damage    I personally reviewed active problem list, medication list, allergies, family history, social history, health maintenance with the patient/caregiver today.   ROS  Constitutional: Negative for fever or weight change.  Respiratory: Negative for cough and shortness of breath.   Cardiovascular: Negative for chest pain or palpitations.  Gastrointestinal: Negative for abdominal pain, no bowel changes.  Musculoskeletal: Negative for gait problem or joint swelling.  Skin: Negative for rash.  Neurological: Negative for dizziness or headache.  No other specific complaints in a complete review of systems (except as listed in HPI above).  Objective  Vitals:   10/15/20 1003  BP: 130/72  Pulse: 69  Resp: 16  Temp: 98.1 F (36.7 C)  TempSrc: Oral  SpO2: 99%  Weight: 203 lb (92.1 kg)  Height: 5\' 11"  (1.803 m)    Body mass index is 28.31 kg/m.  Physical Exam  Constitutional: Patient appears well-developed and well-nourished.  No distress.  HEENT: head atraumatic, normocephalic, pupils equal and reactive to light, neck supple Cardiovascular: Normal rate, regular rhythm and normal heart sounds.  No murmur heard. No BLE edema. Pulmonary/Chest: Effort normal and breath sounds normal. No respiratory distress. Abdominal: Soft.  There is no tenderness. Muscular skeletal: normal rom of hip, no pain  during palpation of trochanteric bursa  Psychiatric: Patient has a normal mood and affect. behavior is normal. Judgment and thought content normal.  Recent Results (from the past 2160 hour(s))  SARS CORONAVIRUS 2 (TAT 6-24 HRS) Nasopharyngeal Nasopharyngeal Swab     Status: None   Collection Time: 08/30/20  3:47 PM   Specimen: Nasopharyngeal Swab  Result Value Ref Range   SARS Coronavirus 2 NEGATIVE NEGATIVE    Comment: (NOTE) SARS-CoV-2 target nucleic acids are NOT DETECTED.  The SARS-CoV-2 RNA is generally detectable in upper and lower respiratory specimens during the acute phase of infection. Negative results do not preclude SARS-CoV-2 infection, do not rule out co-infections with other pathogens, and should not be used as the sole basis for treatment or other patient management decisions. Negative results must be combined with clinical observations, patient history, and epidemiological information. The expected result is Negative.  Fact Sheet for Patients: SugarRoll.be  Fact Sheet  for Healthcare Providers: https://www.woods-mathews.com/  This test is not yet approved or cleared by the [place] and  has been authorized for detection and/or diagnosis of SARS-CoV-2 by FDA under an Emergency Use Authorization (EUA). This EUA will remain  in effect (meaning this test can be used) for the duration of the COVID-19 declaration under Se ction 564(b)(1) of the Act, 21 U.S.C. section 360bbb-3(b)(1), unless the authorization is terminated or revoked sooner.  Performed at [facility], [place] [address] [place], [place] [address]   Surgical pathology     Status: None   Collection Time: 09/01/20  8:46 AM  Result Value Ref Range   SURGICAL PATHOLOGY      SURGICAL PATHOLOGY CASE: [id number] PATIENT: [facility] Surgical Pathology Report     Specimen Submitted: A. Colon polyp, splenic flexure; cbx  Clinical History:  History of colon cancer. Polyp.      DIAGNOSIS: A. COLON POLYP, SPLENIC FLEXURE; COLD BIOPSY: - SESSILE SERRATED POLYP. - NEGATIVE FOR DYSPLASIA AND MALIGNANCY.  GROSS DESCRIPTION: A. Labeled: cbx splenic flexure polyp Received: Formalin Collection time: 8:46 AM on 09/01/2020 Placed into formalin time: 8:46 AM on 09/01/2020 Tissue fragment(s): Multiple Size: Aggregate, 1.4 x 0.4 x 0.2 cm Description: Tan soft tissue fragments Entirely submitted in 1 cassette.   Final Diagnosis performed by Quay Burow, MD.   Electronically signed 09/02/2020 9:58:12AM The electronic signature indicates that the named Attending Pathologist has evaluated the specimen Technical component performed at [facility], [address] [place], [place] [address] Lab: [phone] Dir: Rush Farmer, MD, MMM  Professiona l component performed at [facility], [facility], [place], [place], [place] [address] Lab: [phone] Dir: Dellia Nims. Rubinas, MD       PHQ2/9: Depression screen [facility] 2/9 10/15/2020 07/27/2020 04/14/2020 10/15/2019 07/18/2019  Decreased Interest 2 3 0 0 0  Down, Depressed, Hopeless 0 0 0 0 0  PHQ - 2 Score 2 3 0 0 0  Altered sleeping 2 3 1 1  -  Tired, decreased energy 1 3 0 1 -  Change in appetite 0 0 0 0 -  Feeling bad or failure about yourself  0 0 0 0 -  Trouble concentrating 0 0 0 1 -  Moving slowly or fidgety/restless 0 0 0 0 -  Suicidal thoughts 0 0 0 0 -  PHQ-9 Score 5 9 1 3  -  Difficult doing work/chores - Somewhat difficult - Not difficult at all -  Some recent data might be hidden    phq 9 is negative, he states thinks secondary to seroquel, very sluggish   Fall Risk: Fall Risk  10/15/2020 07/27/2020 04/14/2020 10/15/2019 07/18/2019  Falls in the past year? 1 1 1  0 0  Number falls in past yr: 0 0 0 0 0  Injury with Fall? 0 0 0 0 0  Risk for fall due to : - Medication side effect - - -  Follow up - Falls prevention discussed - - Falls prevention discussed      Functional Status Survey: Is the patient deaf or have difficulty hearing?: No Does the patient have difficulty seeing, even when wearing glasses/contacts?: No Does the patient have difficulty concentrating, remembering, or making decisions?: No Does the patient have difficulty walking or climbing stairs?: No Does the patient have difficulty dressing or bathing?: No Does the patient have difficulty doing errands alone such as visiting a doctor's office or shopping?: No    Assessment & Plan  1. Pure hypercholesterolemia  -  Lipid panel  2. Vitamin D deficiency  - VITAMIN D 25 Hydroxy (Vit-D Deficiency, Fractures)  3. Benign hypertension with chronic kidney disease, stage III (HCC)  - valsartan-hydrochlorothiazide (DIOVAN-HCT) 80-12.5 MG tablet; Take 1 tablet by mouth daily.  Dispense: 90 tablet; Refill: 1  4. Stage 3a chronic kidney disease (HCC)  - COMPLETE METABOLIC PANEL WITH GFR - Microalbumin / creatinine urine ratio - Parathyroid hormone, intact (no Ca) - Hemoglobin and hematocrit, blood  5. Chronic insomnia  - Suvorexant (BELSOMRA) 10 MG TABS; Take 1 tablet by mouth every evening.  Dispense: 30 tablet; Refill: 0  6. Hyperglycemia  - Hemoglobin A1c  7. Herpes genitalis in men  - valACYclovir (VALTREX) 1000 MG tablet; Take 1 tablet (1,000 mg total) by mouth daily.  Dispense: 90 tablet; Refill: 3  8. Essential hypertension  - valsartan-hydrochlorothiazide (DIOVAN-HCT) 80-12.5 MG tablet; Take 1 tablet by mouth daily.  Dispense: 90 tablet; Refill: 1

## 2020-10-18 ENCOUNTER — Other Ambulatory Visit: Payer: Self-pay | Admitting: Family Medicine

## 2020-10-18 LAB — MICROALBUMIN / CREATININE URINE RATIO
Creatinine, Urine: 174 mg/dL (ref 20–320)
Microalb Creat Ratio: 2 mcg/mg creat (ref <30)
Microalb, Ur: 0.3 mg/dL

## 2020-10-18 LAB — LIPID PANEL
Cholesterol: 192 mg/dL (ref <200)
HDL: 54 mg/dL (ref >=40)
LDL Cholesterol (Calc): 118 mg/dL (calc) — ABNORMAL HIGH
Non-HDL Cholesterol (Calc): 138 mg/dL (calc) — ABNORMAL HIGH (ref <130)
Total CHOL/HDL Ratio: 3.6 (calc) (ref <5.0)
Triglycerides: 101 mg/dL (ref <150)

## 2020-10-18 LAB — HEMOGLOBIN AND HEMATOCRIT, BLOOD
HCT: 42.6 % (ref 38.5–50.0)
Hemoglobin: 14.4 g/dL (ref 13.2–17.1)

## 2020-10-18 LAB — COMPLETE METABOLIC PANEL WITH GFR
AG Ratio: 1.5 (calc) (ref 1.0–2.5)
ALT: 22 U/L (ref 9–46)
AST: 21 U/L (ref 10–35)
Albumin: 4.1 g/dL (ref 3.6–5.1)
Alkaline phosphatase (APISO): 56 U/L (ref 35–144)
BUN/Creatinine Ratio: 18 (calc) (ref 6–22)
BUN: 30 mg/dL — ABNORMAL HIGH (ref 7–25)
CO2: 30 mmol/L (ref 20–32)
Calcium: 9.1 mg/dL (ref 8.6–10.3)
Chloride: 106 mmol/L (ref 98–110)
Creat: 1.64 mg/dL — ABNORMAL HIGH (ref 0.70–1.25)
GFR, Est African American: 49 mL/min/{1.73_m2} — ABNORMAL LOW (ref >=60)
GFR, Est Non African American: 42 mL/min/{1.73_m2} — ABNORMAL LOW (ref >=60)
Globulin: 2.8 g/dL (calc) (ref 1.9–3.7)
Glucose, Bld: 87 mg/dL (ref 65–99)
Potassium: 4.5 mmol/L (ref 3.5–5.3)
Sodium: 144 mmol/L (ref 135–146)
Total Bilirubin: 0.5 mg/dL (ref 0.2–1.2)
Total Protein: 6.9 g/dL (ref 6.1–8.1)

## 2020-10-18 LAB — HEMOGLOBIN A1C
Hgb A1c MFr Bld: 5.8 % of total Hgb — ABNORMAL HIGH (ref <5.7)
Mean Plasma Glucose: 120 mg/dL
eAG (mmol/L): 6.6 mmol/L

## 2020-10-18 LAB — PARATHYROID HORMONE, INTACT (NO CA): PTH: 44 pg/mL (ref 14–64)

## 2020-10-18 LAB — VITAMIN D 25 HYDROXY (VIT D DEFICIENCY, FRACTURES): Vit D, 25-Hydroxy: 19 ng/mL — ABNORMAL LOW (ref 30–100)

## 2020-10-18 MED ORDER — VITAMIN D (ERGOCALCIFEROL) 1.25 MG (50000 UNIT) PO CAPS: 50000.0000 [IU] | ORAL_CAPSULE | ORAL | 1 refills | Status: DC

## 2020-11-07 ENCOUNTER — Other Ambulatory Visit: Payer: Self-pay | Admitting: Family Medicine

## 2020-11-07 DIAGNOSIS — J302 Other seasonal allergic rhinitis: Secondary | ICD-10-CM

## 2020-11-07 NOTE — Telephone Encounter (Signed)
Requested Prescriptions  Pending Prescriptions Disp Refills  . loratadine (CLARITIN) 10 MG tablet [Pharmacy Med Name: LORATADINE 10 MG TABLET] 90 tablet 1    Sig: TAKE 1 TABLET BY MOUTH EVERY DAY     Ear, Nose, and Throat:  Antihistamines Passed - 11/07/2020  9:05 AM      Passed - Valid encounter within last 12 months    Recent Outpatient Visits          3 weeks ago Benign hypertension with chronic kidney disease, stage III [facility])   [facility] [place], Drue Stager, MD   6 months ago Benign hypertension with chronic kidney disease, stage III [facility])   [facility] Steele Sizer, MD   1 year ago Chronic insomnia   [facility] Steele Sizer, MD   1 year ago Respiratory illness   [place], [place], FNP   1 year ago Essential hypertension   [facility] Steele Sizer, MD      Future Appointments            In 5 months Ancil Boozer, Drue Stager, MD [facility], [place]   In 5 months Steele Sizer, MD [facility], [place]   In 8 months  [facility], [facility]

## 2020-11-15 ENCOUNTER — Other Ambulatory Visit: Payer: Self-pay | Admitting: Family Medicine

## 2020-11-15 DIAGNOSIS — F5104 Psychophysiologic insomnia: Secondary | ICD-10-CM

## 2020-11-15 NOTE — Telephone Encounter (Signed)
Requested medication (s) are due for refill today: yes  Requested medication (s) are on the active medication list: yes  Last refill:  10/15/2020  Future visit scheduled: yes  Notes to clinic:   Medication not assigned to a protocol, review manually  Requested Prescriptions  Pending Prescriptions Disp Refills   BELSOMRA 10 MG TABS [Pharmacy Med Name: BELSOMRA 10 MG TABLET] 30 tablet 0    Sig: TAKE 1 TABLET BY MOUTH EVERY DAY IN THE EVENING      Off-Protocol Failed - 11/15/2020  2:17 PM      Failed - Medication not assigned to a protocol, review manually.      Passed - Valid encounter within last 12 months    Recent Outpatient Visits           1 month ago Benign hypertension with chronic kidney disease, stage III [facility])   [facility] [place], Drue Stager, MD   7 months ago Benign hypertension with chronic kidney disease, stage III [facility])   [facility] Steele Sizer, MD   1 year ago Chronic insomnia   [facility] Steele Sizer, MD   1 year ago Respiratory illness   [place], [place], FNP   1 year ago Essential hypertension   [facility] Steele Sizer, MD       Future Appointments             In 5 months Ancil Boozer, Drue Stager, MD [facility], [place]   In 5 months Steele Sizer, MD [facility], [place]   In 8 months  [facility], [facility]

## 2020-11-16 ENCOUNTER — Other Ambulatory Visit: Payer: Self-pay | Admitting: Family Medicine

## 2020-11-16 MED ORDER — BELSOMRA 15 MG PO TABS: 1.0000 | ORAL_TABLET | Freq: Every day | ORAL | 0 refills | Status: DC

## 2020-12-17 ENCOUNTER — Other Ambulatory Visit: Payer: Self-pay | Admitting: Family Medicine

## 2021-01-19 DIAGNOSIS — H35033 Hypertensive retinopathy, bilateral: Secondary | ICD-10-CM | POA: Diagnosis not present

## 2021-01-19 DIAGNOSIS — H524 Presbyopia: Secondary | ICD-10-CM | POA: Diagnosis not present

## 2021-02-23 ENCOUNTER — Ambulatory Visit: Payer: Self-pay | Admitting: *Deleted

## 2021-02-23 DIAGNOSIS — N183 Chronic kidney disease, stage 3 unspecified: Secondary | ICD-10-CM | POA: Diagnosis not present

## 2021-02-23 DIAGNOSIS — M10071 Idiopathic gout, right ankle and foot: Secondary | ICD-10-CM | POA: Diagnosis not present

## 2021-02-23 DIAGNOSIS — N289 Disorder of kidney and ureter, unspecified: Secondary | ICD-10-CM | POA: Diagnosis not present

## 2021-02-23 DIAGNOSIS — I129 Hypertensive chronic kidney disease with stage 1 through stage 4 chronic kidney disease, or unspecified chronic kidney disease: Secondary | ICD-10-CM | POA: Diagnosis not present

## 2021-02-23 NOTE — Telephone Encounter (Signed)
Pt reports pain right great toe, 2nd joint. Onset Monday. Does not recall any injury. Pain is at 2nd joint, area slight swollen, slightly red and warm to touch. Rates pain at 2/10 at rest, 5-6/10 walking. NO fever. No h/o gout.  Pt requested to be seen today, no availability. Asked pt to hold while I called practice, he stated he would go to [facility]. Assured pt NT would route to practice for PCPs review.

## 2021-02-23 NOTE — Telephone Encounter (Signed)
Reason for Disposition  [1] Swollen toe AND [2] no fever  (Exceptions: just localized bump from bunion, corns, insect bite, sting)  Answer Assessment - Initial Assessment Questions 1. ONSET: "When did the pain start?"      Monday 2. LOCATION: "Where is the pain located?"   (e.g., around nail, entire toe, at foot joint)      Great toe, right foot, 2nd joint 3. PAIN: "How bad is the pain?"    (Scale 1-10; or mild, moderate, severe)   -  MILD (1-3): doesn't interfere with normal activities    -  MODERATE (4-7): interferes with normal activities (e.g., work or school) or awakens from sleep, limping    -  SEVERE (8-10): excruciating pain, unable to do any normal activities, unable to walk     2/10 at rest. 5-6/10 walking 4. APPEARANCE: "What does the toe look like?" (e.g., redness, swelling, bruising, pallor)     Slight swollen, warm to touch, slight redness 5. CAUSE: "What do you think is causing the toe pain?"     Unsure, no injury 6. OTHER SYMPTOMS: "Do you have any other symptoms?" (e.g., leg pain, rash, fever, numbness)    no  Protocols used: Toe Pain-A-AH

## 2021-03-10 ENCOUNTER — Ambulatory Visit
Admission: RE | Admit: 2021-03-10 | Discharge: 2021-03-10 | Disposition: A | Discharge status: Home / Self Care | Payer: Medicare HMO | Source: Ambulatory Visit | Attending: Unknown Physician Specialty | Admitting: Unknown Physician Specialty

## 2021-03-10 ENCOUNTER — Encounter: Payer: Self-pay | Admitting: Unknown Physician Specialty

## 2021-03-10 ENCOUNTER — Ambulatory Visit (INDEPENDENT_AMBULATORY_CARE_PROVIDER_SITE_OTHER): Payer: Medicare HMO | Admitting: Unknown Physician Specialty

## 2021-03-10 ENCOUNTER — Ambulatory Visit
Admission: RE | Admit: 2021-03-10 | Discharge: 2021-03-10 | Disposition: A | Discharge status: Home / Self Care | Payer: Medicare HMO | Attending: Unknown Physician Specialty | Admitting: Unknown Physician Specialty

## 2021-03-10 ENCOUNTER — Other Ambulatory Visit: Payer: Self-pay

## 2021-03-10 VITALS — BP 132/70 | HR 66 | Temp 98.0°F | Resp 18 | Ht 71.0 in | Wt 210.1 lb

## 2021-03-10 DIAGNOSIS — M25471 Effusion, right ankle: Secondary | ICD-10-CM | POA: Diagnosis not present

## 2021-03-10 DIAGNOSIS — M25571 Pain in right ankle and joints of right foot: Secondary | ICD-10-CM | POA: Diagnosis not present

## 2021-03-10 DIAGNOSIS — M79671 Pain in right foot: Secondary | ICD-10-CM

## 2021-03-10 DIAGNOSIS — M7989 Other specified soft tissue disorders: Secondary | ICD-10-CM | POA: Diagnosis not present

## 2021-03-10 MED ORDER — DOXYCYCLINE HYCLATE 100 MG PO TABS: 100.0000 mg | ORAL_TABLET | Freq: Two times a day (BID) | ORAL | 0 refills | Status: DC

## 2021-03-10 NOTE — Progress Notes (Signed)
BP 132/70   Pulse 66   Temp 98 F (36.7 C) (Oral)   Resp 18   Ht 5\' 11"  (1.803 m)   Wt 210 lb 1.6 oz (95.3 kg)   SpO2 98%   BMI 29.30 kg/m    Subjective:    Patient ID: Frederick Nelson, male    DOB: 04-01-1951, 70 y.o.   MRN: [id number]  HPI: Frederick Nelson is a 70 y.o. male  Chief Complaint  Patient presents with   Foot Pain    Seen at [facility] walk in. Right foot   Gout   Pt was seen at [facility] and diagnosed with gout about 2 weeks ago.  Tey gave him prednisone (40 mg BID) and Hydrocodone.  States the pain is a little better but still "pretty bad"  Since then his medial ankle has also gotten swollen.  Recalls no injury.  Note GFR is low 40's. Not seeing Nephrology  Relevant past medical, surgical, family and social history reviewed and updated as indicated. Interim medical history since our last visit reviewed. Allergies and medications reviewed and updated.  Review of Systems  Per HPI unless specifically indicated above     Objective:    BP 132/70   Pulse 66   Temp 98 F (36.7 C) (Oral)   Resp 18   Ht 5\' 11"  (1.803 m)   Wt 210 lb 1.6 oz (95.3 kg)   SpO2 98%   BMI 29.30 kg/m   Wt Readings from Last 3 Encounters:  03/10/21 210 lb 1.6 oz (95.3 kg)  10/15/20 203 lb (92.1 kg)  09/01/20 195 lb (88.5 kg)    Physical Exam Constitutional:      General: He is not in acute distress.    Appearance: Normal appearance. He is well-developed.  HENT:     Head: Normocephalic and atraumatic.  Eyes:     General: Lids are normal. No scleral icterus.       Right eye: No discharge.        Left eye: No discharge.     Conjunctiva/sclera: Conjunctivae normal.  Cardiovascular:     Rate and Rhythm: Normal rate.  Pulmonary:     Effort: Pulmonary effort is normal.  Abdominal:     Palpations: There is no hepatomegaly or splenomegaly.  Musculoskeletal:        General: Normal range of motion.     Comments: Left large toe swollen and tender at base and extends through forefoot.   Left lateral ankle swollen  Skin:    Coloration: Skin is not pale.     Findings: No rash.  Neurological:     Mental Status: He is alert and oriented to person, place, and time.  Psychiatric:        Behavior: Behavior normal.        Thought Content: Thought content normal.        Judgment: Judgment normal.    Results for orders placed or performed in visit on 10/15/20  Lipid panel  Result Value Ref Range   Cholesterol 192 <200 mg/dL   HDL 54 > OR = 40 mg/dL   Triglycerides 101 <150 mg/dL   LDL Cholesterol (Calc) 118 (H) mg/dL (calc)   Total CHOL/HDL Ratio 3.6 <5.0 (calc)   Non-HDL Cholesterol (Calc) 138 (H) <130 mg/dL (calc)  Hemoglobin A1c  Result Value Ref Range   Hgb A1c MFr Bld 5.8 (H) <5.7 % of total Hgb   Mean Plasma Glucose 120 mg/dL   eAG (mmol/L)  6.6 mmol/L  COMPLETE METABOLIC PANEL WITH GFR  Result Value Ref Range   Glucose, Bld 87 65 - 99 mg/dL   BUN 30 (H) 7 - 25 mg/dL   Creat 1.64 (H) 0.70 - 1.25 mg/dL   GFR, Est Non African American 42 (L) > OR = 60 mL/min/1.8m2   GFR, Est African American 49 (L) > OR = 60 mL/min/1.34m2   BUN/Creatinine Ratio 18 6 - 22 (calc)   Sodium 144 135 - 146 mmol/L   Potassium 4.5 3.5 - 5.3 mmol/L   Chloride 106 98 - 110 mmol/L   CO2 30 20 - 32 mmol/L   Calcium 9.1 8.6 - 10.3 mg/dL   Total Protein 6.9 6.1 - 8.1 g/dL   Albumin 4.1 3.6 - 5.1 g/dL   Globulin 2.8 1.9 - 3.7 g/dL (calc)   AG Ratio 1.5 1.0 - 2.5 (calc)   Total Bilirubin 0.5 0.2 - 1.2 mg/dL   Alkaline phosphatase (APISO) 56 35 - 144 U/L   AST 21 10 - 35 U/L   ALT 22 9 - 46 U/L  VITAMIN D 25 Hydroxy (Vit-D Deficiency, Fractures)  Result Value Ref Range   Vit D, 25-Hydroxy 19 (L) 30 - 100 ng/mL  Microalbumin / creatinine urine ratio  Result Value Ref Range   Creatinine, Urine 174 20 - 320 mg/dL   Microalb, Ur 0.3 mg/dL   Microalb Creat Ratio 2 <30 mcg/mg creat  Parathyroid hormone, intact (no Ca)  Result Value Ref Range   PTH 44 14 - 64 pg/mL  Hemoglobin and  hematocrit, blood  Result Value Ref Range   Hemoglobin 14.4 13.2 - 17.1 g/dL   HCT 42.6 38.5 - 50.0 %      Assessment & Plan:   Problem List Items Addressed This Visit   None Visit Diagnoses     Right ankle swelling    -  Primary   Will get x-ray.   Check labs including uric acid   Relevant Orders   DG Ankle Complete Right   Uric acid   COMPLETE METABOLIC PANEL WITH GFR   CBC with Differential/Platelet   Right foot pain       Dx with gout but no help with Prednisone.  r/o cellulitis.  Rx for Doxycycline 100 mg BID.  Get x-ray   Relevant Orders   DG Foot Complete Left       Discussed with Merlene Pulling   Follow up plan: Return if symptoms worsen or fail to improve.

## 2021-03-11 ENCOUNTER — Other Ambulatory Visit: Payer: Self-pay | Admitting: Unknown Physician Specialty

## 2021-03-11 LAB — CBC WITH DIFFERENTIAL/PLATELET
Absolute Monocytes: 774 cells/uL (ref 200–950)
Basophils Absolute: 91 cells/uL (ref 0–200)
Basophils Relative: 1.4 %
Eosinophils Absolute: 241 cells/uL (ref 15–500)
Eosinophils Relative: 3.7 %
HCT: 46 % (ref 38.5–50.0)
Hemoglobin: 15.3 g/dL (ref 13.2–17.1)
Lymphs Abs: 2854 cells/uL (ref 850–3900)
MCH: 30.7 pg (ref 27.0–33.0)
MCHC: 33.3 g/dL (ref 32.0–36.0)
MCV: 92.4 fL (ref 80.0–100.0)
MPV: 11 fL (ref 7.5–12.5)
Monocytes Relative: 11.9 %
Neutro Abs: 2542 cells/uL (ref 1500–7800)
Neutrophils Relative %: 39.1 %
Platelets: 198 10*3/uL (ref 140–400)
RBC: 4.98 10*6/uL (ref 4.20–5.80)
RDW: 12.4 % (ref 11.0–15.0)
Total Lymphocyte: 43.9 %
WBC: 6.5 10*3/uL (ref 3.8–10.8)

## 2021-03-11 LAB — COMPLETE METABOLIC PANEL WITH GFR
AG Ratio: 1.5 (calc) (ref 1.0–2.5)
ALT: 23 U/L (ref 9–46)
AST: 19 U/L (ref 10–35)
Albumin: 4 g/dL (ref 3.6–5.1)
Alkaline phosphatase (APISO): 61 U/L (ref 35–144)
BUN/Creatinine Ratio: 17 (calc) (ref 6–22)
BUN: 34 mg/dL — ABNORMAL HIGH (ref 7–25)
CO2: 30 mmol/L (ref 20–32)
Calcium: 9.7 mg/dL (ref 8.6–10.3)
Chloride: 103 mmol/L (ref 98–110)
Creat: 2.03 mg/dL — ABNORMAL HIGH (ref 0.70–1.35)
Globulin: 2.6 g/dL (calc) (ref 1.9–3.7)
Glucose, Bld: 82 mg/dL (ref 65–99)
Potassium: 4.8 mmol/L (ref 3.5–5.3)
Sodium: 142 mmol/L (ref 135–146)
Total Bilirubin: 0.5 mg/dL (ref 0.2–1.2)
Total Protein: 6.6 g/dL (ref 6.1–8.1)
eGFR: 35 mL/min/{1.73_m2} — ABNORMAL LOW (ref >=60)

## 2021-03-11 LAB — URIC ACID: Uric Acid, Serum: 9.2 mg/dL — ABNORMAL HIGH (ref 4.0–8.0)

## 2021-03-11 MED ORDER — COLCHICINE 0.6 MG PO TABS: 0.6000 mg | ORAL_TABLET | Freq: Every day | ORAL | 0 refills | Status: DC

## 2021-04-13 NOTE — Progress Notes (Signed)
Name: Frederick Nelson   MRN: [id number]    DOB: Sep 26, 1950   Date:04/15/2021       Progress Note  Subjective  Chief Complaint  Follow Up  HPI  Balance problems/ visual changes and also paresthesia: he has intermittent balance problems, also paresthesia - on and off - feels like burning or itching of skin anywhere on his body it lasts a few minutes per episode on and off for the past few weeks. He also has intermittent visual disturbance ( unable to read due to lines not being on a straight line) it can happen on right or left eye, resolves in about 10 minutes - it has been happening for the past few years, used to be 2-3 times a year but recently a couple of times a week.   Insomnia: he took Ambien for a while, followed by Trazodone and since August he is taking Seroquel, he states it is causing to feel sluggish and in a fog all day and affected his sex life, he is now on Belsomra 15 mg , initially it worked however noticing only lasting 4 hours, we will adjust dose   HTN: he is taking valsartan/hctz 80/12.5,  bp is well controlled, however he recently had a gout attack and we will stop hctz and increase dose of valsartan to 160 mg . No chest pain or palpitation    CKI stage 3: : GFR was down to 35  not seeing nephrologist, he recently took colchicine for gout . We will recheck it today    Bilateral hip pain: he has a long history of hip pain, he was  by [facility]. Dr. Ernst Spell  and had a couple of steroid injections on right hip, he states first injection worked better than second. He states pain is not as severe, he states symptoms worse at the end of the day, or when lifting objects. Pain is described as dull pain and usually 2/10 if normal activiies but goes up to 6-7 when more active . He had to stop playing golf because it aggravated symptoms.    ED: he takes Viagra prn, he denies side effects.Unchanged    Gout attack: first lef toe, went to urgent care and was given prednisone but did  not work, seen by Kathrine Haddock and given colchicine , he is doing well now. We will recheck kidney function today and stop hctz, if recurrence of symptoms or uric acid stays high we will add uloric  Herpes genitalis: he is taking prophylactic medication and no recent episodes . Unchanged   Pre-diabetes: he has a history of elevated glucose, last A1C was down to 5.8 % in Feb 2022 discussed a low carbohydrate diet.   Dyslipidemia: he is willing to start statin therapy today   The 10-year ASCVD risk score Mikey Bussing DC Brooke Bonito., et al., 2013) is: 16.8%   Values used to calculate the score:     Age: 34 years     Sex: Male     Is Non-Hispanic African American: Yes     Diabetic: No     Tobacco smoker: No     Systolic Blood Pressure: 481 mmHg     Is BP treated: Yes     HDL Cholesterol: 54 mg/dL     Total Cholesterol: 192 mg/dL   Patient Active Problem List   Diagnosis Date Noted   Sinus bradycardia 06/19/2018   Hyperlipidemia 11/02/2017   Herpes simplex infection of penis 11/01/2017   Erectile dysfunction 08/09/2016  Vitamin D deficiency 08/09/2016   Blurred vision, bilateral 04/27/2016   Degenerative joint disease (DJD) of hip 04/27/2016   Benign hypertension with chronic kidney disease, stage III ([place]) 01/26/2016   Chronic insomnia 01/26/2016   Bilateral leg edema 01/26/2016   History of colon cancer 06/10/2015    Past Surgical History:  Procedure Laterality Date   COLON SURGERY  06/26/2007   right hemicolectomy-T2N0 carcinoma of right colon. The tumor was 2.6 cm,low grade with zero of 14 nodes positive. No evidence of venous or lymphatic invasion   COLONOSCOPY  [id number]   COLONOSCOPY WITH PROPOFOL N/A 08/04/2015   Procedure: COLONOSCOPY WITH PROPOFOL;  Surgeon: [name], MD;  Location: [facility] ENDOSCOPY;  Service: Endoscopy;  Laterality: N/A;   COLONOSCOPY WITH PROPOFOL N/A 09/01/2020   Procedure: COLONOSCOPY WITH PROPOFOL;  Surgeon: [name], MD;  Location: [facility]  ENDOSCOPY;  Service: Endoscopy;  Laterality: N/A;    Family History  Problem Relation Age of Onset   Cancer Brother        cancer of the thymus   Diabetes Mother    Hypertension Father    Stroke Father    Cancer Maternal Aunt        breast cancer    Social History   Tobacco Use   Smoking status: Never   Smokeless tobacco: Never  Substance Use Topics   Alcohol use: Yes    Alcohol/week: 1.0 - 2.0 standard drink    Types: 1 - 2 Standard drinks or equivalent per week    Comment: Socially only - Very little     Current Outpatient Medications:    loratadine (CLARITIN) 10 MG tablet, TAKE 1 TABLET BY MOUTH EVERY DAY, Disp: 90 tablet, Rfl: 1   rosuvastatin (CRESTOR) 10 MG tablet, Take 1 tablet (10 mg total) by mouth daily., Disp: 90 tablet, Rfl: 0   Suvorexant (BELSOMRA) 20 MG TABS, Take 20 mg by mouth at bedtime as needed., Disp: 90 tablet, Rfl: 1   valACYclovir (VALTREX) 1000 MG tablet, Take 1 tablet (1,000 mg total) by mouth daily., Disp: 90 tablet, Rfl: 3   valsartan (DIOVAN) 160 MG tablet, Take 1 tablet (160 mg total) by mouth daily., Disp: 90 tablet, Rfl: 0   colchicine 0.6 MG tablet, Take 1 tablet (0.6 mg total) by mouth daily. Day 1: take 2 pills.  You can take another in one hour if no relief.  After that, take 1 pill twice a day. (Patient not taking: Reported on 04/15/2021), Disp: 20 tablet, Rfl: 0   sildenafil (VIAGRA) 100 MG tablet, Take 0.5-1 tablets (50-100 mg total) by mouth daily as needed for erectile dysfunction., Disp: 30 tablet, Rfl: 0   Vitamin D, Ergocalciferol, (DRISDOL) 1.25 MG (50000 UNIT) CAPS capsule, Take 1 capsule (50,000 Units total) by mouth every 7 (seven) days., Disp: 12 capsule, Rfl: 1  Allergies  Allergen Reactions   Ace Inhibitors Swelling   Ciprofloxacin     Tendon damage    I personally reviewed active problem list, medication list, allergies, family history, social history, health maintenance with the patient/caregiver  today.   ROS  Constitutional: Negative for fever or weight change.  Respiratory: Negative for cough and shortness of breath.   Cardiovascular: Negative for chest pain or palpitations.  Gastrointestinal: Negative for abdominal pain, no bowel changes.  Musculoskeletal: Negative for gait problem or joint swelling.  Skin: Negative for rash.  Neurological: positive  for dizziness and intermittent paresthesia, denies  headache.  No other specific complaints in a  complete review of systems (except as listed in HPI above).   Objective  Vitals:   04/15/21 0937  BP: 124/82  Pulse: 86  Resp: 16  Temp: (!) 97.5 F (36.4 C)  TempSrc: Oral  SpO2: 95%  Weight: 207 lb 4.8 oz (94 kg)  Height: 5' 11" (1.803 m)    Body mass index is 28.91 kg/m.  Physical Exam  Constitutional: Patient appears well-developed and well-nourished. Overweight.  No distress.  HEENT: head atraumatic, normocephalic, pupils equal and reactive to light, neck supple Cardiovascular: Normal rate, regular rhythm and normal heart sounds.  No murmur heard.1 plus ankle BLE edema. Pulmonary/Chest: Effort normal and breath sounds normal. No respiratory distress. Abdominal: Soft.  There is no tenderness. Psychiatric: Patient has a normal mood and affect. behavior is normal. Judgment and thought content normal.  Muscular skeletal: no pain during exam of left foot   Recent Results (from the past 2160 hour(s))  Uric acid     Status: Abnormal   Collection Time: 03/10/21  3:56 PM  Result Value Ref Range   Uric Acid, Serum 9.2 (H) 4.0 - 8.0 mg/dL    Comment: Therapeutic target for gout patients: <6.0 mg/dL .   COMPLETE METABOLIC PANEL WITH GFR     Status: Abnormal   Collection Time: 03/10/21  3:56 PM  Result Value Ref Range   Glucose, Bld 82 65 - 99 mg/dL    Comment: .            Fasting reference interval .    BUN 34 (H) 7 - 25 mg/dL   Creat 2.03 (H) 0.70 - 1.35 mg/dL   eGFR 35 (L) > OR = 60 mL/min/1.68m    Comment:  The eGFR is based on the CKD-EPI 2021 equation. To calculate  the new eGFR from a previous Creatinine or Cystatin C result, go to https://www.kidney.org/professionals/ kdoqi/gfr%5Fcalculator    BUN/Creatinine Ratio 17 6 - 22 (calc)   Sodium 142 135 - 146 mmol/L   Potassium 4.8 3.5 - 5.3 mmol/L   Chloride 103 98 - 110 mmol/L   CO2 30 20 - 32 mmol/L   Calcium 9.7 8.6 - 10.3 mg/dL   Total Protein 6.6 6.1 - 8.1 g/dL   Albumin 4.0 3.6 - 5.1 g/dL   Globulin 2.6 1.9 - 3.7 g/dL (calc)   AG Ratio 1.5 1.0 - 2.5 (calc)   Total Bilirubin 0.5 0.2 - 1.2 mg/dL   Alkaline phosphatase (APISO) 61 35 - 144 U/L   AST 19 10 - 35 U/L   ALT 23 9 - 46 U/L  CBC with Differential/Platelet     Status: None   Collection Time: 03/10/21  3:56 PM  Result Value Ref Range   WBC 6.5 3.8 - 10.8 Thousand/uL   RBC 4.98 4.20 - 5.80 Million/uL   Hemoglobin 15.3 13.2 - 17.1 g/dL   HCT 46.0 38.5 - 50.0 %   MCV 92.4 80.0 - 100.0 fL   MCH 30.7 27.0 - 33.0 pg   MCHC 33.3 32.0 - 36.0 g/dL   RDW 12.4 11.0 - 15.0 %   Platelets 198 140 - 400 Thousand/uL   MPV 11.0 7.5 - 12.5 fL   Neutro Abs 2,542 1,500 - 7,800 cells/uL   Lymphs Abs 2,854 850 - 3,900 cells/uL   Absolute Monocytes 774 200 - 950 cells/uL   Eosinophils Absolute 241 15 - 500 cells/uL   Basophils Absolute 91 0 - 200 cells/uL   Neutrophils Relative % 39.1 %   Total Lymphocyte  43.9 %   Monocytes Relative 11.9 %   Eosinophils Relative 3.7 %   Basophils Relative 1.4 %      PHQ2/9: Depression screen [facility] 2/9 04/15/2021 03/10/2021 10/15/2020 07/27/2020 04/14/2020  Decreased Interest 0 0 2 3 0  Down, Depressed, Hopeless 0 0 0 0 0  PHQ - 2 Score 0 0 2 3 0  Altered sleeping - - _0 Tired, decreased energy - - 1 3 0  Change in appetite - - 0 0 0  Feeling bad or failure about yourself  - - 0 0 0  Trouble concentrating - - 0 0 0  Moving slowly or fidgety/restless - - 0 0 0  Suicidal thoughts - - 0 0 0  PHQ-9 Score - - _1 Difficult doing work/chores - - -  Somewhat difficult -  Some recent data might be hidden    phq 9 is negative   Fall Risk: Fall Risk  04/15/2021 03/10/2021 10/15/2020 07/27/2020 04/14/2020  Falls in the past year? 0 0 _2 Number falls in past yr: 0 0 0 0 0  Injury with Fall? 0 0 0 0 0  Risk for fall due to : - - - Medication side effect -  Follow up Falls evaluation completed Falls evaluation completed - Falls prevention discussed -      Functional Status Survey: Is the patient deaf or have difficulty hearing?: No Does the patient have difficulty seeing, even when wearing glasses/contacts?: No Does the patient have difficulty concentrating, remembering, or making decisions?: No Does the patient have difficulty walking or climbing stairs?: No Does the patient have difficulty dressing or bathing?: No Does the patient have difficulty doing errands alone such as visiting a doctor's office or shopping?: No    Assessment & Plan  1. Stage 3a chronic kidney disease (HCC)  - valsartan (DIOVAN) 160 MG tablet; Take 1 tablet (160 mg total) by mouth daily.  Dispense: 90 tablet; Refill: 0 - Renal Function Panel  2. Essential hypertension  - valsartan (DIOVAN) 160 MG tablet; Take 1 tablet (160 mg total) by mouth daily.  Dispense: 90 tablet; Refill: 0  3. Benign hypertension with chronic kidney disease, stage III (HCC)  - valsartan (DIOVAN) 160 MG tablet; Take 1 tablet (160 mg total) by mouth daily.  Dispense: 90 tablet; Refill: 0  4. Vitamin D deficiency  - Vitamin D, Ergocalciferol, (DRISDOL) 1.25 MG (50000 UNIT) CAPS capsule; Take 1 capsule (50,000 Units total) by mouth every 7 (seven) days.  Dispense: 12 capsule; Refill: 1  5. Chronic insomnia  - Suvorexant (BELSOMRA) 20 MG TABS; Take 20 mg by mouth at bedtime as needed.  Dispense: 90 tablet; Refill: 1  6. Hyperglycemia   7. Pure hypercholesterolemia  - rosuvastatin (CRESTOR) 10 MG tablet; Take 1 tablet (10 mg total) by mouth daily.  Dispense: 90 tablet;  Refill: 0  8. Paresthesia  Discussed MRI brain and c-spine since he also has problems with dizziness and balance difficulties. Discussed possible MS but he would like to hold off on studies at this time   9. Dizziness   10. Erectile dysfunction, unspecified erectile dysfunction type  - sildenafil (VIAGRA) 100 MG tablet; Take 0.5-1 tablets (50-100 mg total) by mouth daily as needed for erectile dysfunction.  Dispense: 30 tablet; Refill: 0

## 2021-04-15 ENCOUNTER — Other Ambulatory Visit: Payer: Self-pay

## 2021-04-15 ENCOUNTER — Ambulatory Visit (INDEPENDENT_AMBULATORY_CARE_PROVIDER_SITE_OTHER): Payer: Medicare HMO | Admitting: Family Medicine

## 2021-04-15 ENCOUNTER — Encounter: Payer: Self-pay | Admitting: Family Medicine

## 2021-04-15 VITALS — BP 124/82 | HR 86 | Temp 97.5°F | Resp 16 | Ht 71.0 in | Wt 207.3 lb

## 2021-04-15 DIAGNOSIS — E7800 Pure hypercholesterolemia, unspecified: Secondary | ICD-10-CM | POA: Diagnosis not present

## 2021-04-15 DIAGNOSIS — N529 Male erectile dysfunction, unspecified: Secondary | ICD-10-CM | POA: Diagnosis not present

## 2021-04-15 DIAGNOSIS — I129 Hypertensive chronic kidney disease with stage 1 through stage 4 chronic kidney disease, or unspecified chronic kidney disease: Secondary | ICD-10-CM

## 2021-04-15 DIAGNOSIS — I10 Essential (primary) hypertension: Secondary | ICD-10-CM

## 2021-04-15 DIAGNOSIS — R739 Hyperglycemia, unspecified: Secondary | ICD-10-CM

## 2021-04-15 DIAGNOSIS — N1831 Chronic kidney disease, stage 3a: Secondary | ICD-10-CM

## 2021-04-15 DIAGNOSIS — E559 Vitamin D deficiency, unspecified: Secondary | ICD-10-CM

## 2021-04-15 DIAGNOSIS — R202 Paresthesia of skin: Secondary | ICD-10-CM | POA: Diagnosis not present

## 2021-04-15 DIAGNOSIS — R42 Dizziness and giddiness: Secondary | ICD-10-CM | POA: Diagnosis not present

## 2021-04-15 DIAGNOSIS — R69 Illness, unspecified: Secondary | ICD-10-CM | POA: Diagnosis not present

## 2021-04-15 DIAGNOSIS — N183 Chronic kidney disease, stage 3 unspecified: Secondary | ICD-10-CM

## 2021-04-15 DIAGNOSIS — F5104 Psychophysiologic insomnia: Secondary | ICD-10-CM

## 2021-04-15 LAB — RENAL FUNCTION PANEL
Albumin: 4 g/dL (ref 3.6–5.1)
BUN/Creatinine Ratio: 15 (calc) (ref 6–22)
BUN: 28 mg/dL — ABNORMAL HIGH (ref 7–25)
CO2: 31 mmol/L (ref 20–32)
Calcium: 9.4 mg/dL (ref 8.6–10.3)
Chloride: 107 mmol/L (ref 98–110)
Creat: 1.9 mg/dL — ABNORMAL HIGH (ref 0.70–1.35)
Glucose, Bld: 100 mg/dL — ABNORMAL HIGH (ref 65–99)
Phosphorus: 3.5 mg/dL (ref 2.1–4.3)
Potassium: 4.4 mmol/L (ref 3.5–5.3)
Sodium: 142 mmol/L (ref 135–146)

## 2021-04-15 MED ORDER — SILDENAFIL CITRATE 100 MG PO TABS: 50.0000 mg | ORAL_TABLET | Freq: Every day | ORAL | 0 refills | Status: DC | PRN

## 2021-04-15 MED ORDER — VITAMIN D (ERGOCALCIFEROL) 1.25 MG (50000 UNIT) PO CAPS: 50000.0000 [IU] | ORAL_CAPSULE | ORAL | 1 refills | Status: DC

## 2021-04-15 MED ORDER — VALSARTAN 160 MG PO TABS: 160.0000 mg | ORAL_TABLET | Freq: Every day | ORAL | 0 refills | Status: DC

## 2021-04-15 MED ORDER — BELSOMRA 20 MG PO TABS: 20.0000 mg | ORAL_TABLET | Freq: Every evening | ORAL | 1 refills | Status: DC | PRN

## 2021-04-15 MED ORDER — ROSUVASTATIN CALCIUM 10 MG PO TABS: 10.0000 mg | ORAL_TABLET | Freq: Every day | ORAL | 0 refills | Status: DC

## 2021-04-28 NOTE — Progress Notes (Signed)
Name: Frederick Nelson   MRN: [id number]    DOB: [birth year]   Date:04/29/2021       Progress Note  Subjective  Chief Complaint  Annual Exam  HPI  Patient presents for annual CPE .  IPSS Questionnaire (AUA-7): Over the past month.   1)  How often have you had a sensation of not emptying your bladder completely after you finish urinating?  0 - Not at all  2)  How often have you had to urinate again less than two hours after you finished urinating? 1 - Less than 1 time in 5  3)  How often have you found you stopped and started again several times when you urinated?  0 - Not at all  4) How difficult have you found it to postpone urination?  1 - Less than 1 time in 5  5) How often have you had a weak urinary stream?  1 - Less than 1 time in 5  6) How often have you had to push or strain to begin urination?  0 - Not at all  7) How many times did you most typically get up to urinate from the time you went to bed until the time you got up in the morning?  2 - 2 times  Total score:  0-7 mildly symptomatic   8-19 moderately symptomatic   20-35 severely symptomatic      Diet: eating smaller portions  Exercise: discussed 150 minutes per week   Depression: phq 9 is negative Depression screen [facility] 2/9 04/29/2021 04/15/2021 03/10/2021 10/15/2020 07/27/2020  Decreased Interest 0 0 0 2 3  Down, Depressed, Hopeless 0 0 0 0 0  PHQ - 2 Score 0 0 0 2 3  Altered sleeping - - - 2 3  Tired, decreased energy - - - 1 3  Change in appetite - - - 0 0  Feeling bad or failure about yourself  - - - 0 0  Trouble concentrating - - - 0 0  Moving slowly or fidgety/restless - - - 0 0  Suicidal thoughts - - - 0 0  PHQ-9 Score - - - 5 9  Difficult doing work/chores - - - - Somewhat difficult  Some recent data might be hidden    Hypertension:  BP Readings from Last 3 Encounters:  04/29/21 126/80  04/15/21 124/82  03/10/21 132/70    Obesity: Wt Readings from Last 3 Encounters:  04/29/21 205 lb (93 kg)   04/15/21 207 lb 4.8 oz (94 kg)  03/10/21 210 lb 1.6 oz (95.3 kg)   BMI Readings from Last 3 Encounters:  04/29/21 28.59 kg/m  04/15/21 28.91 kg/m  03/10/21 29.30 kg/m     Lipids:  Lab Results  Component Value Date   CHOL 192 10/15/2020   CHOL 211 (H) 03/26/2019   CHOL 208 (H) 11/01/2017   Lab Results  Component Value Date   HDL 54 10/15/2020   HDL 51 03/26/2019   HDL 60 11/01/2017   Lab Results  Component Value Date   LDLCALC 118 (H) 10/15/2020   LDLCALC 137 (H) 03/26/2019   LDLCALC 129 (H) 11/01/2017   Lab Results  Component Value Date   TRIG 101 10/15/2020   TRIG 112 03/26/2019   TRIG 88 11/01/2017   Lab Results  Component Value Date   CHOLHDL 3.6 10/15/2020   CHOLHDL 4.1 03/26/2019   CHOLHDL 3.5 11/01/2017   No results found for: LDLDIRECT Glucose:  Glucose, Bld  Date Value Ref Range  Status  04/15/2021 100 (H) 65 - 99 mg/dL Final    Comment:    .            Fasting reference interval . For someone without known diabetes, a glucose value between 100 and 125 mg/dL is consistent with prediabetes and should be confirmed with a follow-up test. .   03/10/2021 82 65 - 99 mg/dL Final    Comment:    .            Fasting reference interval .   10/15/2020 87 65 - 99 mg/dL Final    Comment:    .            Fasting reference interval .     [place] Office Visit from 04/15/2021 in [facility]  AUDIT-C Score 0       Divorced STD testing and prevention (HIV/chl/gon/syphilis): N/A Hep C: 05/29/17  Skin cancer: Discussed monitoring for atypical lesions Colorectal cancer: 09/01/20  Prostate cancer:  Lab Results  Component Value Date   PSA 0.9 07/02/2018   PSA 1.1 05/29/2017   PSA 1.0 05/11/2016     Lung cancer: Low Dose CT Chest recommended if Age 74-80 years, 30 pack-year currently smoking OR have quit w/in 15years. Patient does not qualify.   AAA: The USPSTF recommends one-time screening with ultrasonography in  men ages 71 to 60 years who have ever smoked - does not qualify  ECG:  06/13/18  Vaccines:   Shingrix: 51-64 yo and ask insurance if covered when patient above 45 yo Pneumonia: educated and discussed with patient. Flu: today   Advanced Care Planning: A voluntary discussion about advance care planning including the explanation and discussion of advance directives.  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 The tumor was 2.6 cm,low grade with zero of 14 nodes positive. No evidence of venous or lymphatic invasion   COLONOSCOPY  [id number]   COLONOSCOPY WITH PROPOFOL N/A 08/04/2015   Procedure: COLONOSCOPY WITH PROPOFOL;  Surgeon: [name], MD;  Location: [facility] ENDOSCOPY;  Service: Endoscopy;  Laterality: N/A;   COLONOSCOPY WITH PROPOFOL N/A 09/01/2020   Procedure: COLONOSCOPY WITH PROPOFOL;  Surgeon: [name], MD;  Location: [facility] ENDOSCOPY;  Service: Endoscopy;  Laterality: N/A;    Family History  Problem Relation Age of Onset   Cancer Brother        cancer of the thymus   Diabetes Mother    Hypertension Father    Stroke Father    Cancer Maternal Aunt        breast cancer    Social History   Socioeconomic History   Marital status: Divorced    Spouse name: Not on  file   Number of children: 1   Years of education: 37  Highest education level: Some college, no degree  Occupational History   Not on file  Tobacco Use   Smoking status: Never   Smokeless tobacco: Never  Vaping Use   Vaping Use: Never used  Substance and Sexual Activity   Alcohol use: Yes    Alcohol/week: 1.0 - 2.0 standard drink    Types: 1 - 2 Standard drinks or equivalent per week    Comment: Socially only - Very little   Drug use: No   Sexual activity: Yes    Birth control/protection: None  Other Topics Concern   Not on file  Social History Narrative   Pt lives alone   Social Determinants of Health   Financial Resource Strain: Low Risk    Difficulty of Paying Living Expenses: Not hard at all  Food Insecurity: No Food Insecurity   Worried About Charity fundraiser in the Last Year: Never true   [place] in the Last Year: Never true  Transportation Needs: No Transportation Needs   Lack of Transportation (Medical): No   Lack of Transportation (Non-Medical): No  Physical Activity: Insufficiently Active   Days of Exercise per Week: 2 days   Minutes of Exercise per Session: 20 min  Stress: No Stress Concern Present   Feeling of Stress : Not at all  Social Connections: Moderately Isolated   Frequency of Communication with Friends and Family: More than three times a week   Frequency of Social Gatherings with Friends and Family: Twice a week   Attends Religious Services: Never   Marine scientist or Organizations: Yes   Attends Music therapist: More than 4 times per year   Marital Status: Divorced  Human resources officer Violence: Not At Risk   Fear of Current or Ex-Partner: No   Emotionally Abused: No   Physically Abused: No   Sexually Abused: No     Current Outpatient Medications:    loratadine (CLARITIN) 10 MG tablet, TAKE 1 TABLET BY MOUTH EVERY DAY, Disp: 90 tablet, Rfl: 1   sildenafil (VIAGRA) 100 MG tablet, Take 0.5-1 tablets (50-100 mg  total) by mouth daily as needed for erectile dysfunction., Disp: 30 tablet, Rfl: 0   Suvorexant (BELSOMRA) 20 MG TABS, Take 20 mg by mouth at bedtime as needed., Disp: 90 tablet, Rfl: 1   valACYclovir (VALTREX) 1000 MG tablet, Take 1 tablet (1,000 mg total) by mouth daily., Disp: 90 tablet, Rfl: 3   valsartan (DIOVAN) 160 MG tablet, Take 1 tablet (160 mg total) by mouth daily., Disp: 90 tablet, Rfl: 0   Vitamin D, Ergocalciferol, (DRISDOL) 1.25 MG (50000 UNIT) CAPS capsule, Take 1 capsule (50,000 Units total) by mouth every 7 (seven) days., Disp: 12 capsule, Rfl: 1   colchicine 0.6 MG tablet, Take 1 tablet (0.6 mg total) by mouth daily. Day 1: take 2 pills.  You can take another in one hour if no relief.  After that, take 1 pill twice a day. (Patient not taking: Reported on 04/29/2021), Disp: 20 tablet, Rfl: 0   rosuvastatin (CRESTOR) 10 MG tablet, Take 1 tablet (10 mg total) by mouth daily. (Patient not taking: Reported on 04/29/2021), Disp: 90 tablet, Rfl: 0  Allergies  Allergen Reactions   Ace Inhibitors Swelling   Ciprofloxacin     Tendon damage     ROS  Constitutional: Negative for fever or weight change.  Respiratory: Negative for cough and shortness of breath.   Cardiovascular: Negative for chest pain or palpitations.  Gastrointestinal: Negative  for abdominal pain, no bowel changes.  Musculoskeletal: Negative for gait problem or joint swelling.  Skin: Negative for rash.  Neurological: Negative for dizziness or headache.  No other specific complaints in a complete review of systems (except as listed in HPI above).    Objective  Vitals:   04/29/21 0901  BP: 126/80  Pulse: 60  Resp: 16  Temp: 97.7 F (36.5 C)  SpO2: 99%  Weight: 205 lb (93 kg)  Height: _0  (1.803 m)    Body mass index is 28.59 kg/m.  Physical Exam  Constitutional: Patient appears well-developed and well-nourished. No distress.  HENT: Head: Normocephalic and atraumatic. Ears: B TMs ok, no erythema or  effusion; Nose: Nose normal. Mouth/Throat: not done  Eyes: Conjunctivae and EOM are normal. Pupils are equal, round, and reactive to light. No scleral icterus.  Neck: Normal range of motion. Neck supple. No JVD present. No thyromegaly present.  Cardiovascular: Normal rate, regular rhythm and normal heart sounds.  No murmur heard. No BLE edema. Pulmonary/Chest: Effort normal and breath sounds normal. No respiratory distress. Abdominal: Soft. Bowel sounds are normal, no distension. There is no tenderness. no masses MALE GENITALIA: Normal descended testes bilaterally, no masses palpated, no hernias, no lesions, no discharge RECTAL: Prostate enlarged in size and consistency, no rectal masses or hemorrhoids  Musculoskeletal: Normal range of motion, no joint effusions. No gross deformities Neurological: he is alert and oriented to person, place, and time. No cranial nerve deficit. Coordination, balance, strength, speech and gait are normal.  Skin: Skin is warm and dry. No rash noted. No erythema.  Psychiatric: Patient has a normal mood and affect. behavior is normal. Judgment and thought content normal.   Recent Results (from the past 2160 hour(s))  Uric acid     Status: Abnormal   Collection Time: 03/10/21  3:56 PM  Result Value Ref Range   Uric Acid, Serum 9.2 (H) 4.0 - 8.0 mg/dL    Comment: Therapeutic target for gout patients: <6.0 mg/dL .   COMPLETE METABOLIC PANEL WITH GFR     Status: Abnormal   Collection Time: 03/10/21  3:56 PM  Result Value Ref Range   Glucose, Bld 82 65 - 99 mg/dL    Comment: .            Fasting reference interval .    BUN 34 (H) 7 - 25 mg/dL   Creat 2.03 (H) 0.70 - 1.35 mg/dL   eGFR 35 (L) > OR = 60 mL/min/1.57m    Comment: The eGFR is based on the CKD-EPI 2021 equation. To calculate  the new eGFR from a previous Creatinine or Cystatin C result, go to https://www.kidney.org/professionals/ kdoqi/gfr%5Fcalculator    BUN/Creatinine Ratio 17 6 - 22 (calc)    Sodium 142 135 - 146 mmol/L   Potassium 4.8 3.5 - 5.3 mmol/L   Chloride 103 98 - 110 mmol/L   CO2 30 20 - 32 mmol/L   Calcium 9.7 8.6 - 10.3 mg/dL   Total Protein 6.6 6.1 - 8.1 g/dL   Albumin 4.0 3.6 - 5.1 g/dL   Globulin 2.6 1.9 - 3.7 g/dL (calc)   AG Ratio 1.5 1.0 - 2.5 (calc)   Total Bilirubin 0.5 0.2 - 1.2 mg/dL   Alkaline phosphatase (APISO) 61 35 - 144 U/L   AST 19 10 - 35 U/L   ALT 23 9 - 46 U/L  CBC with Differential/Platelet     Status: None   Collection Time: 03/10/21  3:56 PM  Result  Value Ref Range   WBC 6.5 3.8 - 10.8 Thousand/uL   RBC 4.98 4.20 - 5.80 Million/uL   Hemoglobin 15.3 13.2 - 17.1 g/dL   HCT 46.0 38.5 - 50.0 %   MCV 92.4 80.0 - 100.0 fL   MCH 30.7 27.0 - 33.0 pg   MCHC 33.3 32.0 - 36.0 g/dL   RDW 12.4 11.0 - 15.0 %   Platelets 198 140 - 400 Thousand/uL   MPV 11.0 7.5 - 12.5 fL   Neutro Abs 2,542 1,500 - 7,800 cells/uL   Lymphs Abs 2,854 850 - 3,900 cells/uL   Absolute Monocytes 774 200 - 950 cells/uL   Eosinophils Absolute 241 15 - 500 cells/uL   Basophils Absolute 91 0 - 200 cells/uL   Neutrophils Relative % 39.1 %   Total Lymphocyte 43.9 %   Monocytes Relative 11.9 %   Eosinophils Relative 3.7 %   Basophils Relative 1.4 %  Renal Function Panel     Status: Abnormal   Collection Time: 04/15/21 10:32 AM  Result Value Ref Range   Glucose, Bld 100 (H) 65 - 99 mg/dL    Comment: .            Fasting reference interval . For someone without known diabetes, a glucose value between 100 and 125 mg/dL is consistent with prediabetes and should be confirmed with a follow-up test. .    BUN 28 (H) 7 - 25 mg/dL   Creat 1.90 (H) 0.70 - 1.35 mg/dL   BUN/Creatinine Ratio 15 6 - 22 (calc)   Sodium 142 135 - 146 mmol/L   Potassium 4.4 3.5 - 5.3 mmol/L   Chloride 107 98 - 110 mmol/L   CO2 31 20 - 32 mmol/L   Calcium 9.4 8.6 - 10.3 mg/dL   Phosphorus 3.5 2.1 - 4.3 mg/dL   Albumin 4.0 3.6 - 5.1 g/dL     Fall Risk: Fall Risk  04/29/2021 04/15/2021  03/10/2021 10/15/2020 07/27/2020  Falls in the past year? 0 0 0 1 1  Number falls in past yr: 0 0 0 0 0  Injury with Fall? 0 0 0 0 0  Risk for fall due to : No Fall Risks - - - Medication side effect  Follow up Falls prevention discussed Falls evaluation completed Falls evaluation completed - Falls prevention discussed     Functional Status Survey: Is the patient deaf or have difficulty hearing?: No Does the patient have difficulty seeing, even when wearing glasses/contacts?: No Does the patient have difficulty concentrating, remembering, or making decisions?: No Does the patient have difficulty walking or climbing stairs?: No Does the patient have difficulty dressing or bathing?: No Does the patient have difficulty doing errands alone such as visiting a doctor's office or shopping?: No    Assessment & Plan  1. Well adult exam   2. Need for immunization against influenza  - Flu Vaccine QUAD High Dose(Fluad)  3. BPH with obstruction/lower urinary tract symptoms  - PSA  4. Stage 3a chronic kidney disease ([place])  - Ambulatory referral to Nephrology  5. Hyperglycemia  - Hemoglobin A1c  6. Pure hypercholesterolemia  - Lipid panel  7. Need for shingles vaccine  - Zoster Vaccine Adjuvanted [facility]) injection; Inject 0.5 mLs into the muscle once for 1 dose.  Dispense: 0.5 mL; Refill: 1     -Prostate cancer screening and PSA options (with potential risks and benefits of testing vs not testing) were discussed along with recent recs/guidelines. -USPSTF grade A and B recommendations  reviewed with patient; age-appropriate recommendations, preventive care, screening tests, etc discussed and encouraged; healthy living encouraged; see AVS for patient education given to patient -Discussed importance of 150 minutes of physical activity weekly, eat two servings of fish weekly, eat one serving of tree nuts ( cashews, pistachios, pecans, almonds.Marland Kitchen) every other day, eat 6 servings of  fruit/vegetables daily and drink plenty of water and avoid sweet beverages.

## 2021-04-28 NOTE — Patient Instructions (Signed)
Preventive Care 69 Years and Older, Male Preventive care refers to lifestyle choices and visits with your health care provider that can promote health and wellness. This includes: A yearly physical exam. This is also called an annual wellness visit. Regular dental and eye exams. Immunizations. Screening for certain conditions. Healthy lifestyle choices, such as: Eating a healthy diet. Getting regular exercise. Not using drugs or products that contain nicotine and tobacco. Limiting alcohol use. What can I expect for my preventive care visit? Physical exam Your health care provider will check your: Height and weight. These may be used to calculate your BMI (body mass index). BMI is a measurement that tells if you are at a healthy weight. Heart rate and blood pressure. Body temperature. Skin for abnormal spots. Counseling Your health care provider may ask you questions about your: Past medical problems. Family's medical history. Alcohol, tobacco, and drug use. Emotional well-being. Home life and relationship well-being. Sexual activity. Diet, exercise, and sleep habits. History of falls. Memory and ability to understand (cognition). Work and work Statistician. Access to firearms. What immunizations do I need? Vaccines are usually given at various ages, according to a schedule. Your health care provider will recommend vaccines for you based on your age, medical history, and lifestyle or other factors, such as travel or where you work. What tests do I need? Blood tests Lipid and cholesterol levels. These may be checked every 5 years, or more often depending on your overall health. Hepatitis C test. Hepatitis B test. Screening Lung cancer screening. You may have this screening every year starting at age 69 if you have a 30-pack-year history of smoking and currently smoke or have quit within the past 15 years. Colorectal cancer screening. All adults should have this screening  starting at age 69 and continuing until age 69. Your health care provider may recommend screening at age 38 if you are at increased risk. You will have tests every 1-10 years, depending on your results and the type of screening test. Prostate cancer screening. Recommendations will vary depending on your family history and other risks. Genital exam to check for testicular cancer or hernias. Diabetes screening. This is done by checking your blood sugar (glucose) after you have not eaten for a while (fasting). You may have this done every 1-3 years. Abdominal aortic aneurysm (AAA) screening. You may need this if you are a current or former smoker. STD (sexually transmitted disease) testing, if you are at risk. Follow these instructions at home: Eating and drinking  Eat a diet that includes fresh fruits and vegetables, whole grains, lean protein, and low-fat dairy products. Limit your intake of foods with high amounts of sugar, saturated fats, and salt. Take vitamin and mineral supplements as recommended by your health care provider. Do not drink alcohol if your health care provider tells you not to drink. If you drink alcohol: Limit how much you have to 0-2 drinks a day. Be aware of how much alcohol is in your drink. In the [place] one drink equals one 12 oz bottle of beer (355 mL), one 5 oz glass of wine (148 mL), or one 1 oz glass of hard liquor (44 mL). Lifestyle Take daily care of your teeth and gums. Brush your teeth every morning and night with fluoride toothpaste. Floss one time each day. Stay active. Exercise for at least 30 minutes 5 or more days each week. Do not use any products that contain nicotine or tobacco, such as cigarettes, e-cigarettes, and chewing tobacco. If  you need help quitting, ask your health care provider. Do not use drugs. If you are sexually active, practice safe sex. Use a condom or other form of protection to prevent STIs (sexually transmitted infections). Talk  with your health care provider about taking a low-dose aspirin or statin. Find healthy ways to cope with stress, such as: Meditation, yoga, or listening to music. Journaling. Talking to a trusted person. Spending time with friends and family. Safety Always wear your seat belt while driving or riding in a vehicle. Do not drive: If you have been drinking alcohol. Do not ride with someone who has been drinking. When you are tired or distracted. While texting. Wear a helmet and other protective equipment during sports activities. If you have firearms in your house, make sure you follow all gun safety procedures. What's next? Visit your health care provider once a year for an annual wellness visit. Ask your health care provider how often you should have your eyes and teeth checked. Stay up to date on all vaccines. This information is not intended to replace advice given to you by your health care provider. Make sure you discuss any questions you have with your health care provider. Document Revised: 10/22/2020 Document Reviewed: 08/08/2018 Elsevier Patient Education  2022 [employer].

## 2021-04-29 ENCOUNTER — Ambulatory Visit (INDEPENDENT_AMBULATORY_CARE_PROVIDER_SITE_OTHER): Payer: Medicare HMO | Admitting: Family Medicine

## 2021-04-29 ENCOUNTER — Other Ambulatory Visit: Payer: Self-pay

## 2021-04-29 ENCOUNTER — Encounter: Payer: Self-pay | Admitting: Family Medicine

## 2021-04-29 VITALS — BP 126/80 | HR 60 | Temp 97.7°F | Resp 16 | Ht 71.0 in | Wt 205.0 lb

## 2021-04-29 DIAGNOSIS — N1831 Chronic kidney disease, stage 3a: Secondary | ICD-10-CM | POA: Diagnosis not present

## 2021-04-29 DIAGNOSIS — Z0000 Encounter for general adult medical examination without abnormal findings: Secondary | ICD-10-CM | POA: Diagnosis not present

## 2021-04-29 DIAGNOSIS — R739 Hyperglycemia, unspecified: Secondary | ICD-10-CM

## 2021-04-29 DIAGNOSIS — E7800 Pure hypercholesterolemia, unspecified: Secondary | ICD-10-CM | POA: Diagnosis not present

## 2021-04-29 DIAGNOSIS — Z23 Encounter for immunization: Secondary | ICD-10-CM

## 2021-04-29 DIAGNOSIS — N401 Enlarged prostate with lower urinary tract symptoms: Secondary | ICD-10-CM | POA: Diagnosis not present

## 2021-04-29 DIAGNOSIS — N138 Other obstructive and reflux uropathy: Secondary | ICD-10-CM | POA: Diagnosis not present

## 2021-04-29 MED ORDER — SHINGRIX 50 MCG/0.5ML IM SUSR: 0.5000 mL | Freq: Once | INTRAMUSCULAR | 1 refills | Status: AC

## 2021-04-30 LAB — LIPID PANEL
Cholesterol: 197 mg/dL (ref <200)
HDL: 50 mg/dL (ref >=40)
LDL Cholesterol (Calc): 128 mg/dL (calc) — ABNORMAL HIGH
Non-HDL Cholesterol (Calc): 147 mg/dL (calc) — ABNORMAL HIGH (ref <130)
Total CHOL/HDL Ratio: 3.9 (calc) (ref <5.0)
Triglycerides: 93 mg/dL (ref <150)

## 2021-04-30 LAB — HEMOGLOBIN A1C
Hgb A1c MFr Bld: 6 % of total Hgb — ABNORMAL HIGH (ref <5.7)
Mean Plasma Glucose: 126 mg/dL
eAG (mmol/L): 7 mmol/L

## 2021-04-30 LAB — PSA: PSA: 1.25 ng/mL (ref <=4.00)

## 2021-05-27 DIAGNOSIS — Z008 Encounter for other general examination: Secondary | ICD-10-CM | POA: Diagnosis not present

## 2021-05-27 DIAGNOSIS — G4700 Insomnia, unspecified: Secondary | ICD-10-CM | POA: Diagnosis not present

## 2021-05-27 DIAGNOSIS — E559 Vitamin D deficiency, unspecified: Secondary | ICD-10-CM | POA: Diagnosis not present

## 2021-05-27 DIAGNOSIS — K219 Gastro-esophageal reflux disease without esophagitis: Secondary | ICD-10-CM | POA: Diagnosis not present

## 2021-05-27 DIAGNOSIS — I10 Essential (primary) hypertension: Secondary | ICD-10-CM | POA: Diagnosis not present

## 2021-05-27 DIAGNOSIS — E663 Overweight: Secondary | ICD-10-CM | POA: Diagnosis not present

## 2021-05-27 DIAGNOSIS — H269 Unspecified cataract: Secondary | ICD-10-CM | POA: Diagnosis not present

## 2021-05-27 DIAGNOSIS — J302 Other seasonal allergic rhinitis: Secondary | ICD-10-CM | POA: Diagnosis not present

## 2021-05-27 DIAGNOSIS — B009 Herpesviral infection, unspecified: Secondary | ICD-10-CM | POA: Diagnosis not present

## 2021-05-27 DIAGNOSIS — E785 Hyperlipidemia, unspecified: Secondary | ICD-10-CM | POA: Diagnosis not present

## 2021-05-27 DIAGNOSIS — G8929 Other chronic pain: Secondary | ICD-10-CM | POA: Diagnosis not present

## 2021-06-06 DIAGNOSIS — I10 Essential (primary) hypertension: Secondary | ICD-10-CM | POA: Insufficient documentation

## 2021-06-06 DIAGNOSIS — N189 Chronic kidney disease, unspecified: Secondary | ICD-10-CM | POA: Insufficient documentation

## 2021-06-07 ENCOUNTER — Other Ambulatory Visit (HOSPITAL_BASED_OUTPATIENT_CLINIC_OR_DEPARTMENT_OTHER): Payer: Self-pay | Admitting: Nephrology

## 2021-06-07 ENCOUNTER — Other Ambulatory Visit: Payer: Self-pay | Admitting: Nephrology

## 2021-06-07 DIAGNOSIS — N1832 Chronic kidney disease, stage 3b: Secondary | ICD-10-CM

## 2021-06-07 DIAGNOSIS — I10 Essential (primary) hypertension: Secondary | ICD-10-CM | POA: Diagnosis not present

## 2021-06-12 ENCOUNTER — Other Ambulatory Visit: Payer: Self-pay | Admitting: Family Medicine

## 2021-06-12 DIAGNOSIS — I129 Hypertensive chronic kidney disease with stage 1 through stage 4 chronic kidney disease, or unspecified chronic kidney disease: Secondary | ICD-10-CM

## 2021-06-12 DIAGNOSIS — I10 Essential (primary) hypertension: Secondary | ICD-10-CM

## 2021-06-12 DIAGNOSIS — N183 Chronic kidney disease, stage 3 unspecified: Secondary | ICD-10-CM

## 2021-06-12 NOTE — Telephone Encounter (Signed)
Called [facility] and spoke with Gerald Stabs (pharmacist). Advised that the HCTZ was dc'd and pt is on Valsartan. was dc'd. Gerald Stabs stated he will remove from profile.

## 2021-06-14 ENCOUNTER — Ambulatory Visit
Admission: RE | Admit: 2021-06-14 | Discharge: 2021-06-14 | Disposition: A | Discharge status: Home / Self Care | Payer: Medicare HMO | Source: Ambulatory Visit | Attending: Nephrology | Admitting: Nephrology

## 2021-06-14 ENCOUNTER — Other Ambulatory Visit: Payer: Self-pay

## 2021-06-14 DIAGNOSIS — N1832 Chronic kidney disease, stage 3b: Secondary | ICD-10-CM | POA: Diagnosis not present

## 2021-06-14 DIAGNOSIS — N281 Cyst of kidney, acquired: Secondary | ICD-10-CM | POA: Diagnosis not present

## 2021-07-07 ENCOUNTER — Other Ambulatory Visit: Payer: Self-pay | Admitting: Family Medicine

## 2021-07-07 ENCOUNTER — Encounter: Payer: Self-pay | Admitting: *Deleted

## 2021-07-07 ENCOUNTER — Other Ambulatory Visit: Payer: Self-pay | Admitting: *Deleted

## 2021-07-07 DIAGNOSIS — J302 Other seasonal allergic rhinitis: Secondary | ICD-10-CM

## 2021-07-07 NOTE — Telephone Encounter (Signed)
Requested Prescriptions  Pending Prescriptions Disp Refills  . loratadine (CLARITIN) 10 MG tablet [Pharmacy Med Name: LORATADINE 10 MG TABLET] 90 tablet 1    Sig: TAKE 1 TABLET BY MOUTH EVERY DAY     Ear, Nose, and Throat:  Antihistamines Passed - 07/07/2021  4:08 AM      Passed - Valid encounter within last 12 months    Recent Outpatient Visits          2 months ago Well adult exam   [facility] Steele Sizer, MD   2 months ago Stage 3a chronic kidney disease [facility])   [facility] Steele Sizer, MD   3 months ago Right ankle swelling   [facility] [place], Malachy Mood, NP   8 months ago Benign hypertension with chronic kidney disease, stage III [facility]   [facility] Steele Sizer, MD   1 year ago Benign hypertension with chronic kidney disease, stage III [facility])   [facility] Steele Sizer, MD      Future Appointments            In 3 weeks  [facility], [place]   In 3 months Steele Sizer, MD [facility], [facility]

## 2021-07-13 ENCOUNTER — Encounter: Payer: Self-pay | Admitting: Internal Medicine

## 2021-07-13 ENCOUNTER — Inpatient Hospital Stay: Payer: Medicare HMO

## 2021-07-13 ENCOUNTER — Other Ambulatory Visit: Payer: Self-pay

## 2021-07-13 ENCOUNTER — Inpatient Hospital Stay: Payer: Medicare HMO | Attending: Internal Medicine | Admitting: Internal Medicine

## 2021-07-13 DIAGNOSIS — I129 Hypertensive chronic kidney disease with stage 1 through stage 4 chronic kidney disease, or unspecified chronic kidney disease: Secondary | ICD-10-CM | POA: Insufficient documentation

## 2021-07-13 DIAGNOSIS — D472 Monoclonal gammopathy: Secondary | ICD-10-CM | POA: Diagnosis not present

## 2021-07-13 DIAGNOSIS — Z803 Family history of malignant neoplasm of breast: Secondary | ICD-10-CM | POA: Diagnosis not present

## 2021-07-13 DIAGNOSIS — Z808 Family history of malignant neoplasm of other organs or systems: Secondary | ICD-10-CM | POA: Diagnosis not present

## 2021-07-13 DIAGNOSIS — N183 Chronic kidney disease, stage 3 unspecified: Secondary | ICD-10-CM | POA: Diagnosis not present

## 2021-07-13 DIAGNOSIS — Z85038 Personal history of other malignant neoplasm of large intestine: Secondary | ICD-10-CM | POA: Insufficient documentation

## 2021-07-13 NOTE — Progress Notes (Signed)
[facility] CONSULT NOTE  Patient Care Team: Steele Sizer, MD as PCP - General (Family Medicine) Bary Castilla, Forest Gleason, MD as Consulting Physician (General Surgery) Clyde Canterbury, MD as Referring Physician (Otolaryngology)  CHIEF COMPLAINTS/PURPOSE OF CONSULTATION: Monoclonal gammopathy  HEMATOLOGY HISTORY  # CKD III > 15 years[ no anemia; hypercalcemia]stage- [Dr.Lateef]  Abnormal Protein Band 1 NONE DETECTED g/dL 0.6 High     # COLON CANCER- s/p hemi-colectomy [2008;Dr.Byrnett [facility]]- no chemo-RT  HISTORY OF PRESENTING ILLNESS: Alone.  Ambulating independently.  Frederick Nelson 71 y.o.  male has been referred to [place] by nephrology for further evaluation/work-up for monoclonal gammopathy.  Patient had a recent episode of gout-for which he was evaluated by PCP.  However also noted to have worsening renal function with a GFR of 35.  Of note patient has chronic kidney disease stage III for more than 15 years.  No unusual bone pain.   Denies any tingling or numbness.  Review of Systems  Constitutional:  Negative for chills, diaphoresis, fever, malaise/fatigue and weight loss.  HENT:  Negative for nosebleeds and sore throat.   Eyes:  Negative for double vision.  Respiratory:  Negative for cough, hemoptysis, sputum production, shortness of breath and wheezing.   Cardiovascular:  Negative for chest pain, palpitations, orthopnea and leg swelling.  Gastrointestinal:  Negative for abdominal pain, blood in stool, constipation, diarrhea, heartburn, melena, nausea and vomiting.  Genitourinary:  Negative for dysuria, frequency and urgency.  Skin: Negative.  Negative for itching and rash.  Neurological:  Negative for dizziness, tingling, focal weakness, weakness and headaches.  Endo/Heme/Allergies:  Does not bruise/bleed easily.  Psychiatric/Behavioral:  Negative for depression. The patient is not nervous/anxious and does not have insomnia.    MEDICAL HISTORY:  Past Medical  History:  Diagnosis Date   Abdominal pain, right lower quadrant    Allergy    Arthritis    Benign hypertension with chronic kidney disease, stage III (HCC)    Chronic low back pain    CKD (chronic kidney disease), stage III (HCC)    Degenerative joint disease (DJD) of hip    Genital herpes    Takes Valtrex for prevention   GERD (gastroesophageal reflux disease) 1995   Hyperlipidemia    Hypertension    Malignant neoplasm of ascending colon ([place]) 2011   Special screening for malignant neoplasms, colon 2011    SURGICAL HISTORY: Past Surgical History:  Procedure Laterality Date   COLON SURGERY  06/26/2007   right hemicolectomy-T2N0 carcinoma of right colon. The tumor was 2.6 cm,low grade with zero of 14 nodes positive. No evidence of venous or lymphatic invasion   COLONOSCOPY  [id number]   COLONOSCOPY WITH PROPOFOL N/A 08/04/2015   Procedure: COLONOSCOPY WITH PROPOFOL;  Surgeon: [name], MD;  Location: [facility] ENDOSCOPY;  Service: Endoscopy;  Laterality: N/A;   COLONOSCOPY WITH PROPOFOL N/A 09/01/2020   Procedure: COLONOSCOPY WITH PROPOFOL;  Surgeon: [name], MD;  Location: [facility] ENDOSCOPY;  Service: Endoscopy;  Laterality: N/A;    SOCIAL HISTORY: Social History   Socioeconomic History   Marital status: Divorced    Spouse name: Not on file   Number of children: 1   Years of education: 12   Highest education level: Some college, no degree  Occupational History   Not on file  Tobacco Use   Smoking status: Never   Smokeless tobacco: Never  Vaping Use   Vaping Use: Never used  Substance and Sexual Activity   Alcohol use: Yes  Alcohol/week: 1.0 - 2.0 standard drink    Types: 1 - 2 Standard drinks or equivalent per week    Comment: Socially only - Very little   Drug use: No   Sexual activity: Yes    Birth control/protection: None  Other Topics Concern   Not on file  Social History Narrative   Pt lives alone; lives in [place]; worked in Scientist, product/process development;  retd [employer]. Non-smokers; Hx of marijuana smoking- when young; rare alcohol.    Social Determinants of Health   Financial Resource Strain: Low Risk    Difficulty of Paying Living Expenses: Not hard at all  Food Insecurity: No Food Insecurity   Worried About Charity fundraiser in the Last Year: Never true   [place] in the Last Year: Never true  Transportation Needs: No Transportation Needs   Lack of Transportation (Medical): No   Lack of Transportation (Non-Medical): No  Physical Activity: Insufficiently Active   Days of Exercise per Week: 2 days   Minutes of Exercise per Session: 20 min  Stress: No Stress Concern Present   Feeling of Stress : Not at all  Social Connections: Moderately Isolated   Frequency of Communication with Friends and Family: More than three times a week   Frequency of Social Gatherings with Friends and Family: Twice a week   Attends Religious Services: Never   Marine scientist or Organizations: Yes   Attends Music therapist: More than 4 times per year   Marital Status: Divorced  Human resources officer Violence: Not At Risk   Fear of Current or Ex-Partner: No   Emotionally Abused: No   Physically Abused: No   Sexually Abused: No    FAMILY HISTORY: Family History  Problem Relation Age of Onset   Cancer Brother        cancer of the thymus   Diabetes Mother    Hypertension Father    Stroke Father    Cancer Maternal Aunt        breast cancer    ALLERGIES:  is allergic to ace inhibitors and ciprofloxacin.  MEDICATIONS:  Current Outpatient Medications  Medication Sig Dispense Refill   Ascorbic Acid (VITA-C PO) Take 1 tablet by mouth daily.     loratadine (CLARITIN) 10 MG tablet TAKE 1 TABLET BY MOUTH EVERY DAY 90 tablet 1   sildenafil (VIAGRA) 100 MG tablet Take 0.5-1 tablets (50-100 mg total) by mouth daily as needed for erectile dysfunction. 30 tablet 0   valACYclovir (VALTREX) 1000 MG tablet Take 1 tablet (1,000 mg  total) by mouth daily. 90 tablet 3   valsartan (DIOVAN) 160 MG tablet Take 1 tablet (160 mg total) by mouth daily. 90 tablet 0   Vitamin D, Ergocalciferol, (DRISDOL) 1.25 MG (50000 UNIT) CAPS capsule Take 1 capsule (50,000 Units total) by mouth every 7 (seven) days. 12 capsule 1   colchicine 0.6 MG tablet Take 1 tablet (0.6 mg total) by mouth daily. Day 1: take 2 pills.  You can take another in one hour if no relief.  After that, take 1 pill twice a day. (Patient not taking: No sig reported) 20 tablet 0   Suvorexant (BELSOMRA) 20 MG TABS Take 20 mg by mouth at bedtime as needed. (Patient not taking: Reported on 07/13/2021) 90 tablet 1   No current facility-administered medications for this visit.      PHYSICAL EXAMINATION:   Vitals:   07/13/21 1128  BP: (!) 159/81  Pulse: (!) 59  Resp: 18  Temp: 98.6 F (37 C)  SpO2: 100%   Filed Weights   07/13/21 1128  Weight: 206 lb (93.4 kg)    Physical Exam Vitals and nursing note reviewed.  HENT:     Head: Normocephalic and atraumatic.     Mouth/Throat:     Pharynx: Oropharynx is clear.  Eyes:     Extraocular Movements: Extraocular movements intact.     Pupils: Pupils are equal, round, and reactive to light.  Cardiovascular:     Rate and Rhythm: Normal rate and regular rhythm.  Pulmonary:     Comments: Decreased breath sounds bilaterally.  Abdominal:     Palpations: Abdomen is soft.  Musculoskeletal:        General: Normal range of motion.     Cervical back: Normal range of motion.  Skin:    General: Skin is warm.  Neurological:     General: No focal deficit present.     Mental Status: He is alert and oriented to person, place, and time.  Psychiatric:        Behavior: Behavior normal.        Judgment: Judgment normal.    LABORATORY DATA:  I have reviewed the data as listed Lab Results  Component Value Date   WBC 6.5 03/10/2021   HGB 15.3 03/10/2021   HCT 46.0 03/10/2021   MCV 92.4 03/10/2021   PLT 198 03/10/2021    Recent Labs    10/15/20 1047 03/10/21 1556 04/15/21 1032  NA 144 142 142  K 4.5 4.8 4.4  CL 106 103 107  CO2 _0 GLUCOSE 87 82 100*  BUN 30* 34* 28*  CREATININE 1.64* 2.03* 1.90*  CALCIUM 9.1 9.7 9.4  GFRNONAA 42*  --   --   GFRAA 49*  --   --   PROT 6.9 6.6  --   AST 21 19  --   ALT 22 23  --   BILITOT 0.5 0.5  --      US RENAL  Result Date: 06/15/2021 CLINICAL DATA:  Stage 3b chronic kidney disease (HCC) EXAM: RENAL / URINARY TRACT ULTRASOUND COMPLETE COMPARISON:  None. FINDINGS: Right Kidney: Renal measurements: 10.0 x 5.3 x 5.3 cm = volume: 147 mL. Echogenicity within normal limits. No mass or hydronephrosis visualized. Left Kidney: Renal measurements: 9.6 x 5.3 x 4.3 cm = volume: 116 mL. Echogenicity within normal limits. No mass or hydronephrosis visualized. A 2.2 cm simple cortical cyst is seen within the lower pole of the left kidney. Bladder: Appears normal for degree of bladder distention. Other: None. IMPRESSION: Normal renal sonogram Electronically Signed   By: Fidela Salisbury M.D.   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  I long discussion with the patient regarding natural history of MGUS; small risk of progression to multiple myeloma. Patient is less likely at this time patient has any active myeloma-although given renal insufficiency-[see discussion below].  Patient had longstanding renal insufficiency [> 15 years].    #Chronic kidney disease-stage-III vs IV- ; followed by nephrology/PCP.   Thank you Dr. Holley Raring for allowing me to participate in the care of your pleasant patient. Please do not hesitate to contact me with questions or concerns in the interim.  # DISPOSITION:  # no labs today # follow up in 6 months- MD; 1  week- prior- cbc/cmp;LDH; M  protein; K/L light chains- Dr.B    All questions were answered. The patient knows to call the clinic with any problems, questions or concerns.      Cammie Sickle, MD 07/13/2021 12:06 PM

## 2021-07-13 NOTE — Progress Notes (Signed)
Patient reports some sleep issues, occasional SOB on exertion, "flashes" of numbness and tingling in hands occasionally, and the need for a hip replacement

## 2021-07-13 NOTE — Assessment & Plan Note (Signed)
#  MGUS-M protein 0.6 g/dL [neprhology]-the context of chronic kidney disease; no anemia/hypercalcemia or concerning for bone lesions.  I long discussion with the patient regarding natural history of MGUS; small risk of progression to multiple myeloma. Patient is less likely at this time patient has any active myeloma-although given renal insufficiency-[see discussion below].  Patient had longstanding renal insufficiency [> 15 years].    #Chronic kidney disease-stage-III vs IV- ; followed by nephrology/PCP.   Thank you Dr. Holley Raring for allowing me to participate in the care of your pleasant patient. Please do not hesitate to contact me with questions or concerns in the interim.  # DISPOSITION:  # no labs today # follow up in 6 months- MD; 1 week- prior- cbc/cmp;LDH; M protein; K/L light chains- Dr.B

## 2021-07-17 ENCOUNTER — Other Ambulatory Visit: Payer: Self-pay | Admitting: Family Medicine

## 2021-07-17 DIAGNOSIS — N183 Chronic kidney disease, stage 3 unspecified: Secondary | ICD-10-CM

## 2021-07-17 DIAGNOSIS — N1831 Chronic kidney disease, stage 3a: Secondary | ICD-10-CM

## 2021-07-17 DIAGNOSIS — I10 Essential (primary) hypertension: Secondary | ICD-10-CM

## 2021-07-17 NOTE — Telephone Encounter (Signed)
Requested Prescriptions  Pending Prescriptions Disp Refills  . valsartan (DIOVAN) 160 MG tablet [Pharmacy Med Name: VALSARTAN 160 MG TABLET] 90 tablet 0    Sig: TAKE 1 TABLET BY MOUTH EVERY DAY     Cardiovascular:  Angiotensin Receptor Blockers Failed - 07/17/2021 10:46 AM      Failed - Cr in normal range and within 180 days    Creat  Date Value Ref Range Status  04/15/2021 1.90 (H) 0.70 - 1.35 mg/dL Final   Creatinine, Urine  Date Value Ref Range Status  10/15/2020 174 20 - 320 mg/dL Final         Failed - Last BP in normal range    BP Readings from Last 1 Encounters:  07/13/21 (!) 159/81         Passed - K in normal range and within 180 days    Potassium  Date Value Ref Range Status  04/15/2021 4.4 3.5 - 5.3 mmol/L Final         Passed - Patient is not pregnant      Passed - Valid encounter within last 6 months    Recent Outpatient Visits          2 months ago Well adult exam   [facility] Steele Sizer, MD   3 months ago Stage 3a chronic kidney disease [facility])   [facility] Steele Sizer, MD   4 months ago Right ankle swelling   [facility] [place], Malachy Mood, NP   9 months ago Benign hypertension with chronic kidney disease, stage III [facility])   [facility] Steele Sizer, MD   1 year ago Benign hypertension with chronic kidney disease, stage III [facility])   [facility] Steele Sizer, MD      Future Appointments            In 1 week  [facility], [place]   In 2 months Steele Sizer, MD [facility], [facility]

## 2021-07-18 DIAGNOSIS — I10 Essential (primary) hypertension: Secondary | ICD-10-CM | POA: Diagnosis not present

## 2021-07-18 DIAGNOSIS — D472 Monoclonal gammopathy: Secondary | ICD-10-CM | POA: Diagnosis not present

## 2021-07-18 DIAGNOSIS — N1831 Chronic kidney disease, stage 3a: Secondary | ICD-10-CM | POA: Diagnosis not present

## 2021-07-28 ENCOUNTER — Other Ambulatory Visit: Payer: Self-pay | Admitting: Family Medicine

## 2021-07-28 ENCOUNTER — Ambulatory Visit (INDEPENDENT_AMBULATORY_CARE_PROVIDER_SITE_OTHER): Payer: Medicare HMO

## 2021-07-28 DIAGNOSIS — Z0000 Encounter for general adult medical examination without abnormal findings: Secondary | ICD-10-CM | POA: Diagnosis not present

## 2021-07-28 NOTE — Progress Notes (Signed)
Subjective:   Frederick Nelson is a 70 y.o. male who presents for Medicare Annual/Subsequent preventive examination.  Virtual Visit via Telephone Note  I connected with  Frederick Nelson on 07/28/21 at  9:20 AM EST by telephone and verified that I am speaking with the correct person using two identifiers.  Location: Patient: home Provider: [place] Persons participating in the virtual visit: Frederick Nelson   I discussed the limitations, risks, security and privacy concerns of performing an evaluation and management service by telephone and the availability of in person appointments. The patient expressed understanding and agreed to proceed.  Interactive audio and video telecommunications were attempted between this nurse and patient, however failed, due to patient having technical difficulties OR patient did not have access to video capability.  We continued and completed visit with audio only.  Some vital signs may be absent or patient reported.   Frederick Marker, LPN   Review of Systems     Cardiac Risk Factors include: advanced age (>19men, >55 women);hypertension;male gender     Objective:    Today's Vitals   07/28/21 0921  PainSc: 2    There is no height or weight on file to calculate BMI.  Advanced Directives 07/28/2021 09/01/2020 07/27/2020 07/18/2019 03/16/2017 03/07/2017 12/08/2016  Does Patient Have a Medical Advance Directive? No No No No No No No  Would patient like information on creating a medical advance directive? Yes (MAU/Ambulatory/Procedural Areas - Information given) No - Patient declined No - Patient declined Yes (MAU/Ambulatory/Procedural Areas - Information given) - - -    Current Medications (verified) Outpatient Encounter Medications as of 07/28/2021  Medication Sig   Ascorbic Acid (VITA-C PO) Take 1 tablet by mouth daily.   Flaxseed, Linseed, (FLAXSEED OIL) 1000 MG CAPS Take by mouth.   loratadine (CLARITIN) 10 MG tablet TAKE 1 TABLET BY  MOUTH EVERY DAY   valsartan (DIOVAN) 160 MG tablet TAKE 1 TABLET BY MOUTH EVERY DAY   Vitamin D, Ergocalciferol, (DRISDOL) 1.25 MG (50000 UNIT) CAPS capsule Take 1 capsule (50,000 Units total) by mouth every 7 (seven) days.   colchicine 0.6 MG tablet Take 1 tablet (0.6 mg total) by mouth daily. Day 1: take 2 pills.  You can take another in one hour if no relief.  After that, take 1 pill twice a day. (Patient not taking: Reported on 04/29/2021)   sildenafil (VIAGRA) 100 MG tablet Take 0.5-1 tablets (50-100 mg total) by mouth daily as needed for erectile dysfunction. (Patient not taking: Reported on 07/28/2021)   Suvorexant (BELSOMRA) 20 MG TABS Take 20 mg by mouth at bedtime as needed. (Patient not taking: Reported on 07/13/2021)   valACYclovir (VALTREX) 1000 MG tablet Take 1 tablet (1,000 mg total) by mouth daily. (Patient not taking: Reported on 07/28/2021)   No facility-administered encounter medications on file as of 07/28/2021.    Allergies (verified) Ace inhibitors and Ciprofloxacin   History: Past Medical History:  Diagnosis Date   Abdominal pain, right lower quadrant    Allergy    Arthritis    Benign hypertension with chronic kidney disease, stage III (HCC)    Chronic low back pain    CKD (chronic kidney disease), stage III (HCC)    Degenerative joint disease (DJD) of hip    Genital herpes    Takes Valtrex for prevention   GERD (gastroesophageal reflux disease) 1995   Hyperlipidemia    Hypertension    Malignant neoplasm of ascending colon ([place]) 2011   Special screening for  malignant neoplasms, colon 2011   Past Surgical History:  Procedure Laterality Date   COLON SURGERY  06/26/2007   right hemicolectomy-T2N0 carcinoma of right colon. The tumor was 2.6 cm,low grade with zero of 14 nodes positive. No evidence of venous or lymphatic invasion   COLONOSCOPY  8101,7510   COLONOSCOPY WITH PROPOFOL N/A 08/04/2015   Procedure: COLONOSCOPY WITH PROPOFOL;  Surgeon: [name], MD;   Location: [facility] ENDOSCOPY;  Service: Endoscopy;  Laterality: N/A;   COLONOSCOPY WITH PROPOFOL N/A 09/01/2020   Procedure: COLONOSCOPY WITH PROPOFOL;  Surgeon: [name], MD;  Location: [facility] ENDOSCOPY;  Service: Endoscopy;  Laterality: N/A;   Family History  Problem Relation Age of Onset   Cancer Brother        cancer of the thymus   Diabetes Mother    Hypertension Father    Stroke Father    Cancer Maternal Aunt        breast cancer   Social History   Socioeconomic History   Marital status: Divorced    Spouse name: Not on file   Number of children: 1   Years of education: 12   Highest education level: Some college, no degree  Occupational History   Not on file  Tobacco Use   Smoking status: Never   Smokeless tobacco: Never  Vaping Use   Vaping Use: Never used  Substance and Sexual Activity   Alcohol use: Yes    Alcohol/week: 1.0 - 2.0 standard drink    Types: 1 - 2 Standard drinks or equivalent per week    Comment: Socially only - Very little   Drug use: No   Sexual activity: Yes    Birth control/protection: None  Other Topics Concern   Not on file  Social History Narrative   Pt lives alone; lives in [place]; worked in Scientist, product/process development; retd [employer]. Non-smokers; Hx of marijuana smoking- when young; rare alcohol.    Social Determinants of Health   Financial Resource Strain: Low Risk    Difficulty of Paying Living Expenses: Not hard at all  Food Insecurity: No Food Insecurity   Worried About Charity fundraiser in the Last Year: Never true   [place] in the Last Year: Never true  Transportation Needs: No Transportation Needs   Lack of Transportation (Medical): No   Lack of Transportation (Non-Medical): No  Physical Activity: Insufficiently Active   Days of Exercise per Week: 2 days   Minutes of Exercise per Session: 20 min  Stress: No Stress Concern Present   Feeling of Stress : Not at all  Social Connections: Moderately Isolated   Frequency  of Communication with Friends and Family: More than three times a week   Frequency of Social Gatherings with Friends and Family: Twice a week   Attends Religious Services: Never   Marine scientist or Organizations: Yes   Attends Music therapist: More than 4 times per year   Marital Status: Divorced    Tobacco Counseling Counseling given: Not Answered   Clinical Intake:  Pre-visit preparation completed: Yes  Pain : 0-10 Pain Score: 2  Pain Type: Acute pain Pain Location: Foot Pain Orientation: Right Pain Descriptors / Indicators: Sore Pain Onset: In the past 7 days Pain Frequency: Constant     Nutritional Risks: None Diabetes: No  How often do you need to have someone help you when you read instructions, pamphlets, or other written materials from your doctor or pharmacy?: 1 -  Never    Interpreter Needed?: No  Information entered by :: Frederick Marker LPN   Activities of Daily Living In your present state of health, do you have any difficulty performing the following activities: 07/28/2021 04/29/2021  Hearing? Y N  Comment declines hearing aids -  Vision? N N  Difficulty concentrating or making decisions? N N  Walking or climbing stairs? N N  Dressing or bathing? N N  Doing errands, shopping? N N  Preparing Food and eating ? N -  Using the Toilet? N -  In the past six months, have you accidently leaked urine? N -  Do you have problems with loss of bowel control? N -  Managing your Medications? N -  Managing your Finances? N -  Housekeeping or managing your Housekeeping? N -  Some recent data might be hidden    Patient Care Team: Steele Sizer, MD as PCP - General (Family Medicine) Bary Castilla, Forest Gleason, MD as Consulting Physician (General Surgery) Anthonette Legato, MD (Nephrology) Cammie Sickle, MD as Consulting Physician (Internal Medicine)  Indicate any recent Medical Services you may have received from other than [facility] providers in  the past year (date may be approximate).     Assessment:   This is a routine wellness examination for [place].  Hearing/Vision screen Hearing Screening - Comments:: Pt c/o slight hearing loss in right ear; declines hearing aids Vision Screening - Comments:: Annual vision screenings done at [facility]  Dietary issues and exercise activities discussed: Current Exercise Habits: Home exercise routine, Type of exercise: walking, Time (Minutes): 20, Frequency (Times/Week): 2, Weekly Exercise (Minutes/Week): 40, Intensity: Mild, Exercise limited by: Other - see comments (gout flares)   Goals Addressed             This Visit's Progress    Patient Stated   On track    Pt would like to remain active and complete renovation on house he is planning to buy. 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        Depression Screen PHQ 2/9 Scores 07/28/2021 04/29/2021 04/15/2021 03/10/2021 10/15/2020 07/27/2020 04/14/2020  PHQ - 2 Score 0 0 0 0 2 3 0  PHQ- 9 Score - - - - 5 9 1     Fall Risk Fall Risk  07/28/2021 04/29/2021 04/15/2021 03/10/2021 10/15/2020  Falls in the past year? 0 0 0 0 1  Number falls in past yr: 0 0 0 0 0  Injury with Fall? 0 0 0 0 0  Risk for fall due to : No Fall Risks No Fall Risks - - -  Follow up Falls prevention discussed Falls prevention discussed Falls evaluation completed Falls evaluation completed -    FALL RISK PREVENTION PERTAINING TO THE HOME:  Any stairs in or around the home? Yes  If so, are there any without handrails? No  Home free of loose throw rugs in walkways, pet beds, electrical cords, etc? Yes  Adequate lighting in your home to reduce risk of falls? Yes   ASSISTIVE DEVICES UTILIZED TO PREVENT FALLS:  Life alert? No  Use of a cane, walker or w/c? No  Grab bars in the bathroom? Yes  Shower chair or bench in shower? No  Elevated toilet seat or a handicapped toilet? No   TIMED UP AND GO:  Was the test performed? No . Telephonic visit.   Cognitive  Function: Normal cognitive status assessed by direct observation by this Nurse Health Advisor. No abnormalities found.  6CIT Screen 07/27/2020 07/18/2019 05/29/2017  What Year? 0 points 0 points 0 points  What month? 0 points 0 points 0 points  What time? 0 points 0 points 0 points  Count back from 20 0 points 0 points 0 points  Months in reverse 0 points 0 points 0 points  Repeat phrase 2 points 0 points 2 points  Total Score 2 0 2    Immunizations Immunization History  Administered Date(s) Administered   Fluad Quad(high Dose 65+) 07/27/2020, 04/29/2021   Influenza, High Dose Seasonal PF 05/29/2017, 06/27/2018, 06/25/2019   Influenza,inj,Quad PF,6+ Mos 06/18/2015, 05/11/2016   Influenza-Unspecified 06/25/2019   Moderna Sars-Covid-2 Vaccination 10/06/2019, 11/05/2019   Pneumococcal Conjugate-13 05/29/2017   Pneumococcal Polysaccharide-23 07/02/2018   Tdap 01/31/2013    TDAP status: Up to date  Flu Vaccine status: Up to date  Pneumococcal vaccine status: Up to date  Covid-19 vaccine status: Completed vaccines  Qualifies for Shingles Vaccine? Yes   Zostavax completed: yes - per patient, no record Shingrix Completed?: No.    Education has been provided regarding the importance of this vaccine. Patient has been advised to call insurance company to determine out of pocket expense if they have not yet received this vaccine. Advised may also receive vaccine at local pharmacy or Health Dept. Verbalized acceptance and understanding.  Screening Tests Health Maintenance  Topic Date Due   Zoster Vaccines- Shingrix (1 of 2) Never done   COVID-19 Vaccine (3 - Booster for Moderna series) 12/31/2019   TETANUS/TDAP  02/01/2023   COLONOSCOPY (Pts 45-38yrs Insurance coverage will need to be confirmed)  09/01/2025   Pneumonia Vaccine 1+ Years old  Completed   INFLUENZA VACCINE  Completed   Hepatitis C Screening  Completed   HPV VACCINES  Aged Out    Health Maintenance  Health  Maintenance Due  Topic Date Due   Zoster Vaccines- Shingrix (1 of 2) Never done   COVID-19 Vaccine (3 - Booster for Moderna series) 12/31/2019    Colorectal cancer screening: Type of screening: Colonoscopy. Completed 09/01/20. Repeat every 5 years  Lung Cancer Screening: (Low Dose CT Chest recommended if Age 75-80 years, 30 pack-year currently smoking OR have quit w/in 15years.) does not qualify.   Additional Screening:  Hepatitis C Screening: does qualify; Completed 05/29/17  Vision Screening: Recommended annual ophthalmology exams for early detection of glaucoma and other disorders of the eye. Is the patient up to date with their annual eye exam?  Yes  Who is the provider or what is the name of the office in which the patient attends annual eye exams? [facility].   Dental Screening: Recommended annual dental exams for proper oral hygiene  Community Resource Referral / Chronic Care Management: CRR required this visit?  No   CCM required this visit?  No      Plan:     I have personally reviewed and noted the following in the patient's chart:   Medical and social history Use of alcohol, tobacco or illicit drugs  Current medications and supplements including opioid prescriptions. Patient is not currently taking opioid prescriptions. Functional ability and status Nutritional status Physical activity Advanced directives List of other physicians Hospitalizations, surgeries, and ER visits in previous 12 months Vitals Screenings to include cognitive, depression, and falls Referrals and appointments  In addition, I have reviewed and discussed with patient certain preventive protocols, quality metrics, and best practice recommendations. A written personalized care plan for preventive services as well as general preventive health recommendations were provided  to patient.     Frederick Marker, LPN   [id number]   Nurse Notes: pt states he has another gout flare in right foot that  started this week. Pt out of colchicine and advised to contact pharmacy for refill. Last prescribed by Kathrine Haddock NP on 03/11/21.  Pt also advised to schedule an appt if needed.   Pt also states he has and issue with his hip and wants to see Dr. Marry Guan with [facility] ortho. Advised to contact insurance company to determine if referral needed and/or discuss with Dr. Ancil Boozer at next office visit.

## 2021-07-28 NOTE — Patient Instructions (Signed)
Frederick Nelson , Thank you for taking time to come for your Medicare Wellness Visit. I appreciate your ongoing commitment to your health goals. Please review the following plan we discussed and let me know if I can assist you in the future.   Screening recommendations/referrals: Colonoscopy: done 09/01/20. Repeat 08/2025 Recommended yearly ophthalmology/optometry visit for glaucoma screening and checkup Recommended yearly dental visit for hygiene and checkup  Vaccinations: Influenza vaccine: done 04/29/21 Pneumococcal vaccine: done 07/02/18 Tdap vaccine: done 01/31/13 Shingles vaccine: Shingrix discussed. Please contact your pharmacy for coverage information.  Covid-19:  done 10/06/19, 11/05/19  Advanced directives: Advance directive discussed with you today. I have provided a copy for you to complete at home and have notarized. Once this is complete please bring a copy in to our office so we can scan it into your chart.   Conditions/risks identified: Recommend increasing physical activity as tolerated  Next appointment: Follow up in one year for your annual wellness visit.   Preventive Care 19 Years and Older, Male Preventive care refers to lifestyle choices and visits with your health care provider that can promote health and wellness. What does preventive care include? A yearly physical exam. This is also called an annual well check. Dental exams once or twice a year. Routine eye exams. Ask your health care provider how often you should have your eyes checked. Personal lifestyle choices, including: Daily care of your teeth and gums. Regular physical activity. Eating a healthy diet. Avoiding tobacco and drug use. Limiting alcohol use. Practicing safe sex. Taking low doses of aspirin every day. Taking vitamin and mineral supplements as recommended by your health care provider. What happens during an annual well check? The services and screenings done by your health care provider during  your annual well check will depend on your age, overall health, lifestyle risk factors, and family history of disease. Counseling  Your health care provider may ask you questions about your: Alcohol use. Tobacco use. Drug use. Emotional well-being. Home and relationship well-being. Sexual activity. Eating habits. History of falls. Memory and ability to understand (cognition). Work and work Statistician. Screening  You may have the following tests or measurements: Height, weight, and BMI. Blood pressure. Lipid and cholesterol levels. These may be checked every 5 years, or more frequently if you are over 31 years old. Skin check. Lung cancer screening. You may have this screening every year starting at age 70 if you have a 30-pack-year history of smoking and currently smoke or have quit within the past 15 years. Fecal occult blood test (FOBT) of the stool. You may have this test every year starting at age 27. Flexible sigmoidoscopy or colonoscopy. You may have a sigmoidoscopy every 5 years or a colonoscopy every 10 years starting at age 56. Prostate cancer screening. Recommendations will vary depending on your family history and other risks. Hepatitis C blood test. Hepatitis B blood test. Sexually transmitted disease (STD) testing. Diabetes screening. This is done by checking your blood sugar (glucose) after you have not eaten for a while (fasting). You may have this done every 1-3 years. Abdominal aortic aneurysm (AAA) screening. You may need this if you are a current or former smoker. Osteoporosis. You may be screened starting at age 41 if you are at high risk. Talk with your health care provider about your test results, treatment options, and if necessary, the need for more tests. Vaccines  Your health care provider may recommend certain vaccines, such as: Influenza vaccine. This is recommended every year.  Tetanus, diphtheria, and acellular pertussis (Tdap, Td) vaccine. You may need  a Td booster every 10 years. Zoster vaccine. You may need this after age 17. Pneumococcal 13-valent conjugate (PCV13) vaccine. One dose is recommended after age 64. Pneumococcal polysaccharide (PPSV23) vaccine. One dose is recommended after age 85. Talk to your health care provider about which screenings and vaccines you need and how often you need them. This information is not intended to replace advice given to you by your health care provider. Make sure you discuss any questions you have with your health care provider. Document Released: 09/10/2015 Document Revised: 05/03/2016 Document Reviewed: 06/15/2015 Elsevier Interactive Patient Education  2017 [place] Prevention in the Home Falls can cause injuries. They can happen to people of all ages. There are many things you can do to make your home safe and to help prevent falls. What can I do on the outside of my home? Regularly fix the edges of walkways and driveways and fix any cracks. Remove anything that might make you trip as you walk through a door, such as a raised step or threshold. Trim any bushes or trees on the path to your home. Use bright outdoor lighting. Clear any walking paths of anything that might make someone trip, such as rocks or tools. Regularly check to see if handrails are loose or broken. Make sure that both sides of any steps have handrails. Any raised decks and porches should have guardrails on the edges. Have any leaves, snow, or ice cleared regularly. Use sand or salt on walking paths during winter. Clean up any spills in your garage right away. This includes oil or grease spills. What can I do in the bathroom? Use night lights. Install grab bars by the toilet and in the tub and shower. Do not use towel bars as grab bars. Use non-skid mats or decals in the tub or shower. If you need to sit down in the shower, use a plastic, non-slip stool. Keep the floor dry. Clean up any water that spills on the  floor as soon as it happens. Remove soap buildup in the tub or shower regularly. Attach bath mats securely with double-sided non-slip rug tape. Do not have throw rugs and other things on the floor that can make you trip. What can I do in the bedroom? Use night lights. Make sure that you have a light by your bed that is easy to reach. Do not use any sheets or blankets that are too big for your bed. They should not hang down onto the floor. Have a firm chair that has side arms. You can use this for support while you get dressed. Do not have throw rugs and other things on the floor that can make you trip. What can I do in the kitchen? Clean up any spills right away. Avoid walking on wet floors. Keep items that you use a lot in easy-to-reach places. If you need to reach something above you, use a strong step stool that has a grab bar. Keep electrical cords out of the way. Do not use floor polish or wax that makes floors slippery. If you must use wax, use non-skid floor wax. Do not have throw rugs and other things on the floor that can make you trip. What can I do with my stairs? Do not leave any items on the stairs. Make sure that there are handrails on both sides of the stairs and use them. Fix handrails that are broken or loose.  Make sure that handrails are as long as the stairways. Check any carpeting to make sure that it is firmly attached to the stairs. Fix any carpet that is loose or worn. Avoid having throw rugs at the top or bottom of the stairs. If you do have throw rugs, attach them to the floor with carpet tape. Make sure that you have a light switch at the top of the stairs and the bottom of the stairs. If you do not have them, ask someone to add them for you. What else can I do to help prevent falls? Wear shoes that: Do not have high heels. Have rubber bottoms. Are comfortable and fit you well. Are closed at the toe. Do not wear sandals. If you use a stepladder: Make sure that  it is fully opened. Do not climb a closed stepladder. Make sure that both sides of the stepladder are locked into place. Ask someone to hold it for you, if possible. Clearly mark and make sure that you can see: Any grab bars or handrails. First and last steps. Where the edge of each step is. Use tools that help you move around (mobility aids) if they are needed. These include: Canes. Walkers. Scooters. Crutches. Turn on the lights when you go into a dark area. Replace any light bulbs as soon as they burn out. Set up your furniture so you have a clear path. Avoid moving your furniture around. If any of your floors are uneven, fix them. If there are any pets around you, be aware of where they are. Review your medicines with your doctor. Some medicines can make you feel dizzy. This can increase your chance of falling. Ask your doctor what other things that you can do to help prevent falls. This information is not intended to replace advice given to you by your health care provider. Make sure you discuss any questions you have with your health care provider. Document Released: 06/10/2009 Document Revised: 01/20/2016 Document Reviewed: 09/18/2014 Elsevier Interactive Patient Education  2017 [employer].

## 2021-07-29 ENCOUNTER — Telehealth: Payer: Self-pay

## 2021-07-29 ENCOUNTER — Other Ambulatory Visit: Payer: Self-pay | Admitting: Family Medicine

## 2021-07-29 MED ORDER — PREDNISONE 10 MG PO TABS: 10.0000 mg | ORAL_TABLET | Freq: Every day | ORAL | 0 refills | Status: DC

## 2021-07-29 NOTE — Telephone Encounter (Signed)
Patient said that he would take the 3 days of prednisone and go from there.Please send it in. Thanks

## 2021-08-25 ENCOUNTER — Telehealth: Payer: Self-pay | Admitting: Family Medicine

## 2021-08-26 ENCOUNTER — Other Ambulatory Visit: Payer: Self-pay

## 2021-08-26 MED ORDER — PREDNISONE 10 MG PO TABS: 10.0000 mg | ORAL_TABLET | Freq: Every day | ORAL | 0 refills | Status: DC

## 2021-08-26 NOTE — Telephone Encounter (Signed)
New message from patient 31 min ago asking reason was denied. Does he just need an appt?

## 2021-08-26 NOTE — Telephone Encounter (Signed)
Requested medication (s) are on the active medication list: yes  Last refill:  07/29/21  Future visit scheduled: 10/13/21  Notes to clinic:  Not a continuous rx, not delegated, please assess.   Requested Prescriptions  Pending Prescriptions Disp Refills   predniSONE (DELTASONE) 10 MG tablet [Pharmacy Med Name: PREDNISONE 10 MG TABLET] 6 tablet 0    Sig: Take 1 tablet (10 mg total) by mouth daily with breakfast.     Not Delegated - Endocrinology:  Oral Corticosteroids Failed - 08/25/2021  5:05 PM      Failed - This refill cannot be delegated      Failed - Last BP in normal range    BP Readings from Last 1 Encounters:  07/13/21 (!) 159/81          Passed - Valid encounter within last 6 months    Recent Outpatient Visits           3 months ago Well adult exam   [facility] Steele Sizer, MD   4 months ago Stage 3a chronic kidney disease [facility])   [facility] Steele Sizer, MD   5 months ago Right ankle swelling   [facility] [place], Malachy Mood, NP   10 months ago Benign hypertension with chronic kidney disease, stage III [facility])   [facility] Steele Sizer, MD   1 year ago Benign hypertension with chronic kidney disease, stage III [facility])   [facility] Steele Sizer, MD       Future Appointments             In 1 month Ancil Boozer, Drue Stager, MD [facility], [place]   In 11 months  [place]

## 2021-08-26 NOTE — Telephone Encounter (Signed)
Pt called in wanting to know why his medication was refused, please advise.

## 2021-09-21 IMAGING — CR DG FOOT COMPLETE 3+V*R*
1 series · 3 of 3 positions shown · non-contrast
Comparison: None.

CLINICAL DATA: Right foot pain

EXAM:
RIGHT FOOT COMPLETE - 3+ VIEW

[Series 1: dg foot complete right · 0.14mm/px · 3 of 3 slices shown]
[im 1/3]
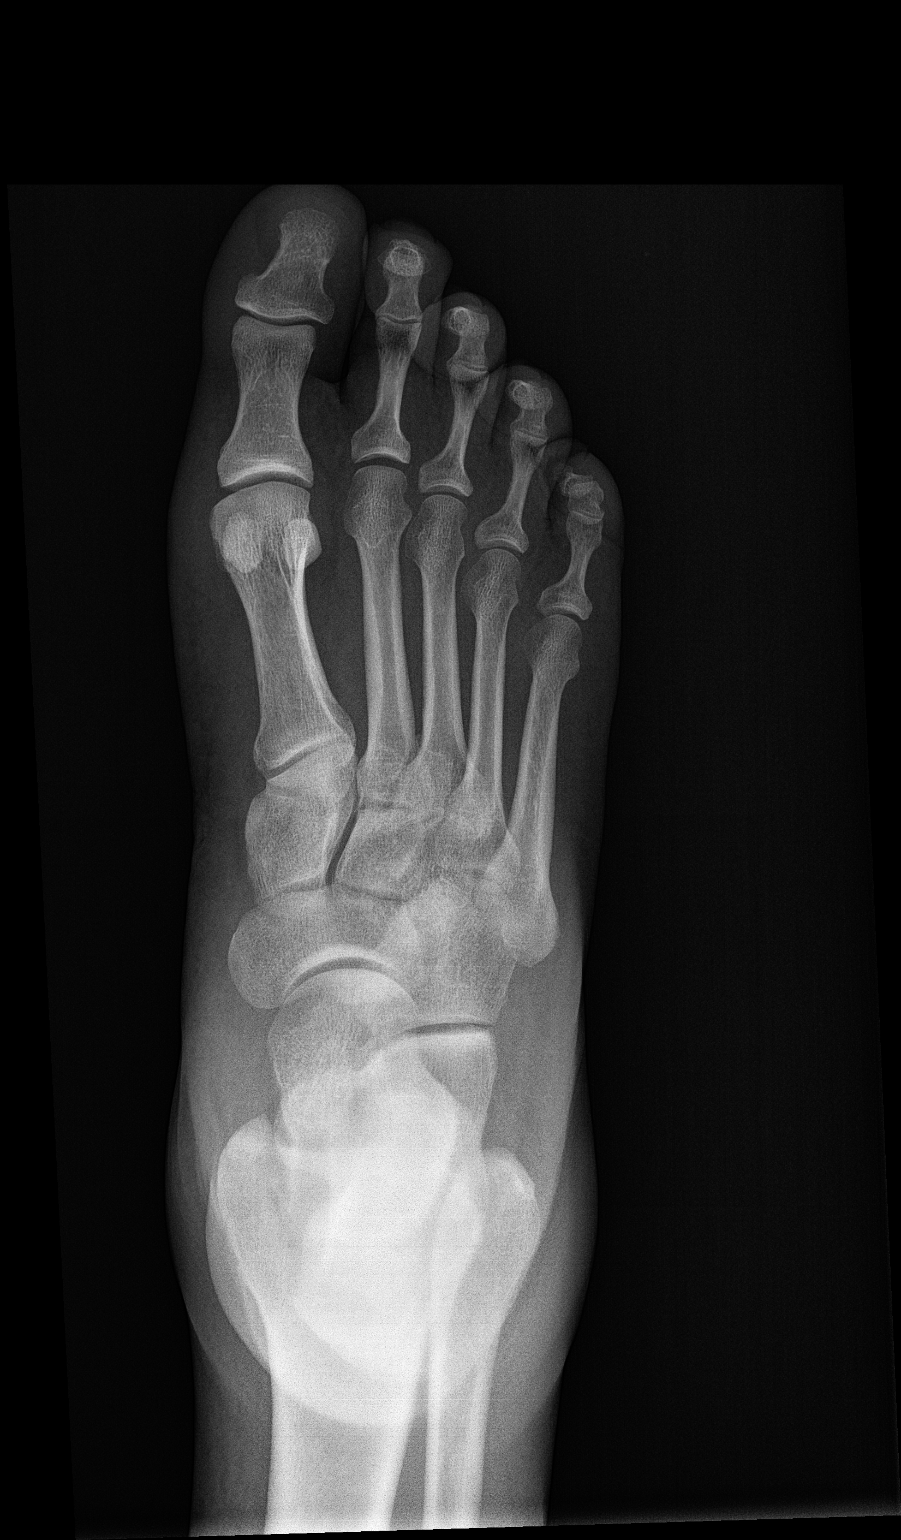
[im 2/3]
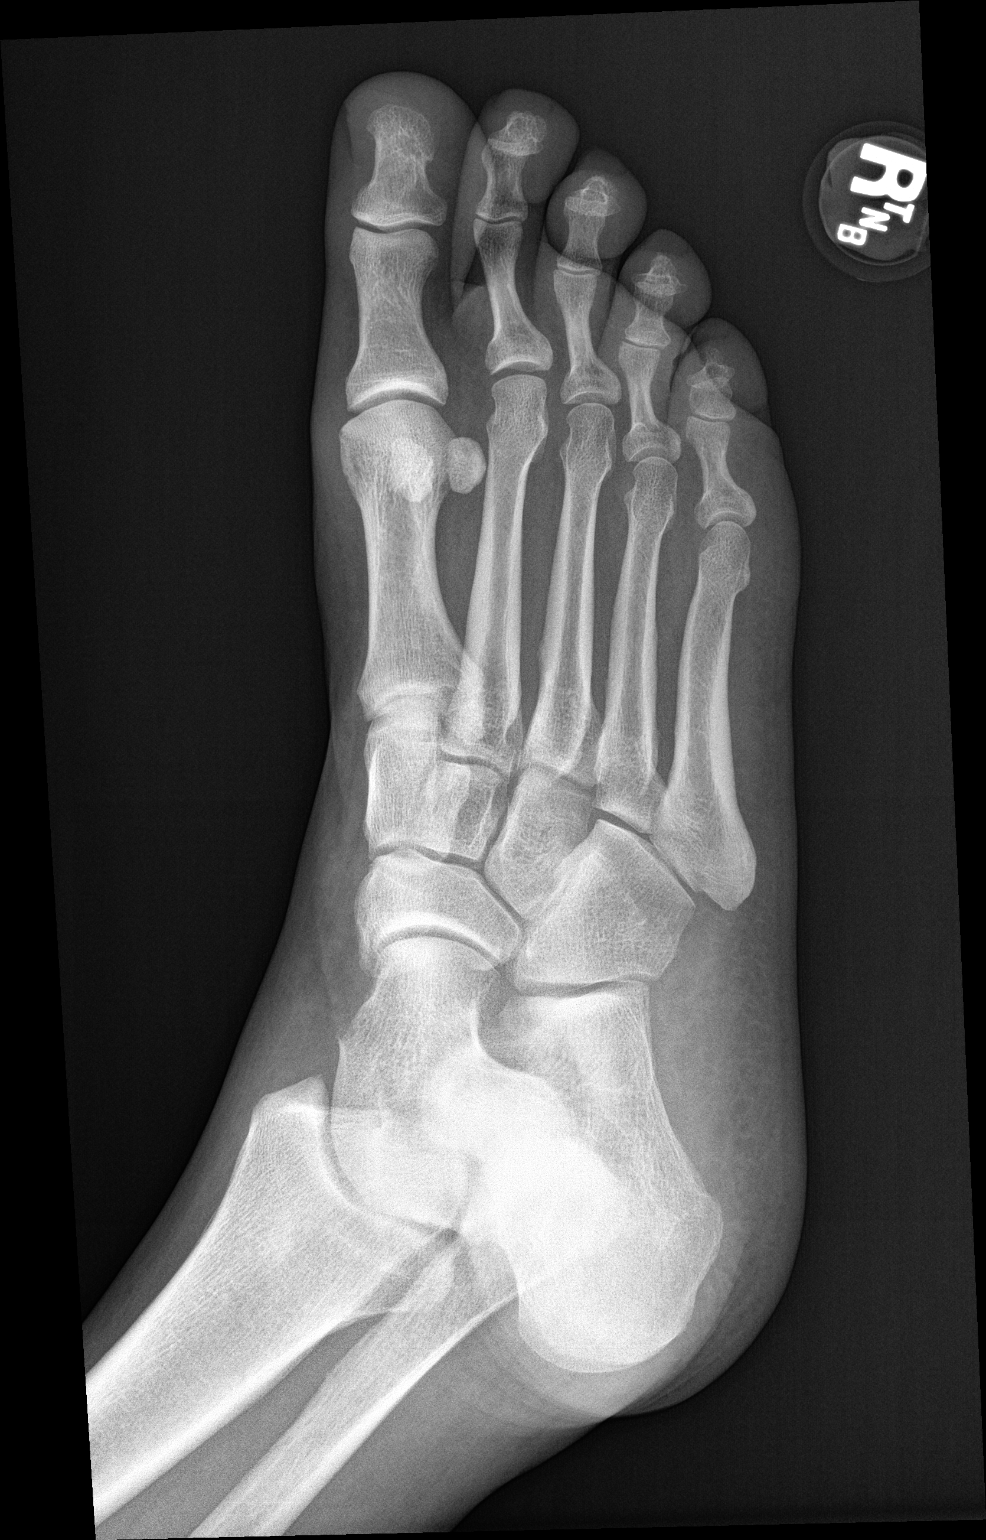
[im 3/3]
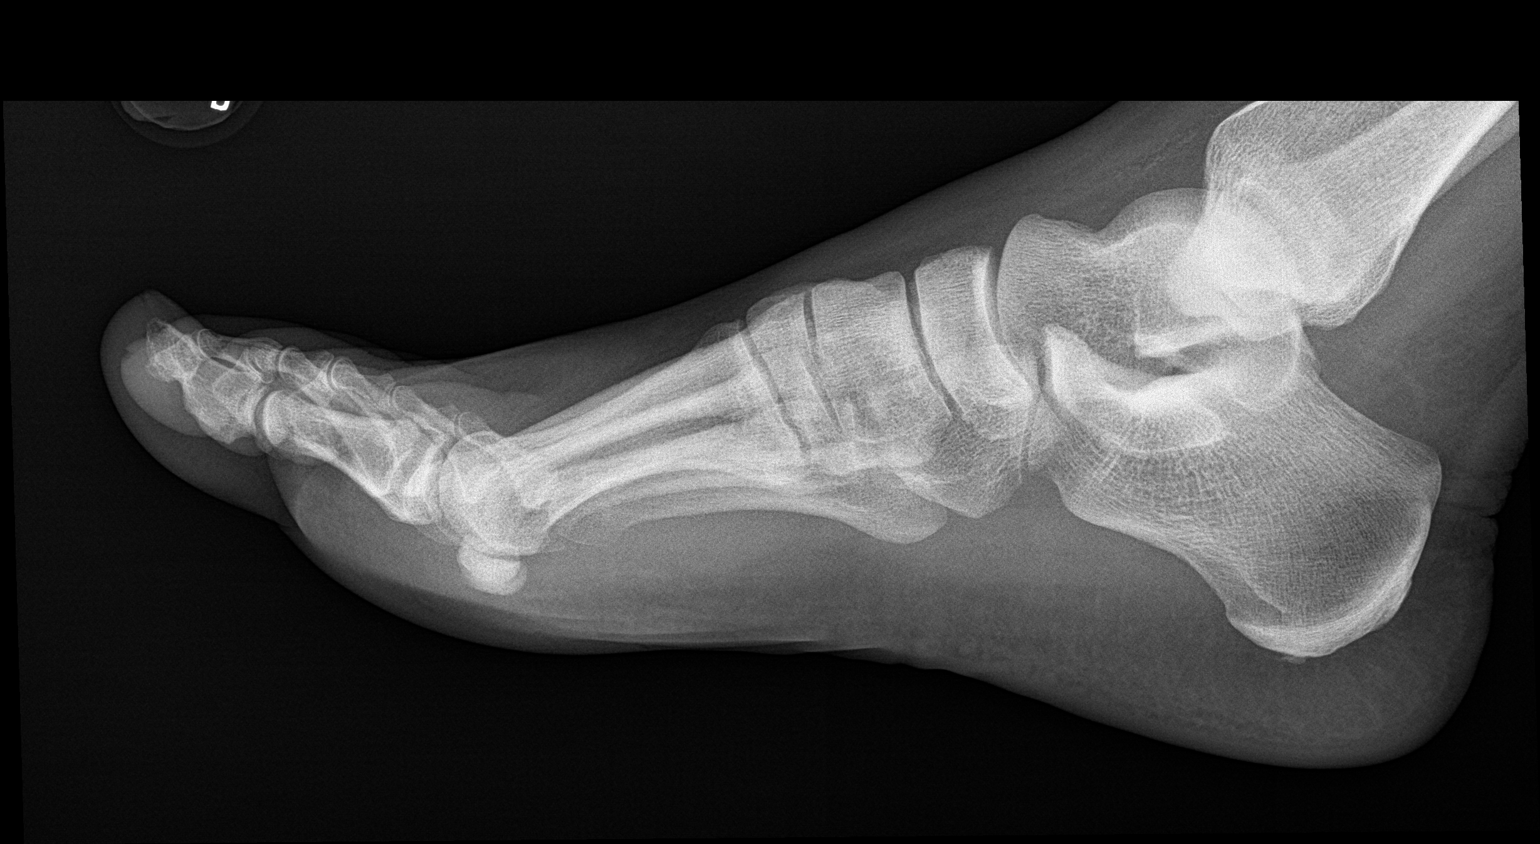

[3 of 3 positions shown; findings below may reference images not displayed]

FINDINGS: There is no evidence of fracture or dislocation. There is no
evidence of arthropathy or other focal bone abnormality. Soft
tissues are unremarkable.
IMPRESSION: Negative.

## 2021-10-11 DIAGNOSIS — M5416 Radiculopathy, lumbar region: Secondary | ICD-10-CM | POA: Diagnosis not present

## 2021-10-11 DIAGNOSIS — M25551 Pain in right hip: Secondary | ICD-10-CM | POA: Diagnosis not present

## 2021-10-12 ENCOUNTER — Other Ambulatory Visit: Payer: Self-pay | Admitting: Orthopedic Surgery

## 2021-10-12 ENCOUNTER — Other Ambulatory Visit (HOSPITAL_COMMUNITY): Payer: Self-pay | Admitting: Orthopedic Surgery

## 2021-10-12 DIAGNOSIS — M5416 Radiculopathy, lumbar region: Secondary | ICD-10-CM

## 2021-10-12 NOTE — Progress Notes (Signed)
Name: Frederick Nelson   MRN: [id number]    DOB: September 04, 1950   Date:10/13/2021       Progress Note  Subjective  Chief Complaint  Follow Up  HPI  Balance problems/ visual changes and also paresthesia: he has intermittent balance problems, also paresthesia - on and off - feels like burning or itching of skin anywhere on his body it lasts a few minutes per episode on and off for the past few weeks. He also has intermittent visual disturbance ( unable to read due to lines not being on a straight line) it can happen on right or left eye, resolves in about 10 minutes - it has been happening for the past few years, used to be 2-3 times a year but frequency has increased to multiple a times a month, reminded him of importance of seeing neurologist but he is not interested   Insomnia: he took Ambien for a while, followed by Trazodone and since August he is taking Seroquel, he states it is causing to feel sluggish and in a fog all day and affected his sex life, he is now on Belsomra 15 mg , initially it worked however noticing only lasting 4 hours, we increased the dose , he decided to stop because medication was helping her sleep at night but feel tired during the day. He states he has been feeling better without taking medications for sleep. He is sleeping in a quieter and darker room   HTN: he is on valsartan 160 mg daily and bp is at goal, no chest pain or palpitation    CKI stage 3: : GFR  done Nov 22 improved - done at nephrologist and GFR was better at 47 , denies pruritis, he has good urine output , uric acid was high and we will start Uloric    Bilateral hip pain: he has a long history of hip pain, he was  by [facility]. Dr. Ernst Spell  and had a couple of steroid injections on right hip, he states first injection worked better than second. He states pain is not as severe, he states symptoms worse at the end of the day, or when lifting objects. Pain is described as dull pain and usually 2/10 if normal  activiies but goes up to 6-7 when more active . He had to stop playing golf because it aggravated symptoms. He was seen by Dr. Dorna Leitz of hips no significant arthritis noticed and is going to have MRI lumbar spine to rule out back problems as a cause of his pain . He denies bowel or bladder incontinence.    ED: he takes Viagra prn, he denies side effects. Unchanged    Gout attack: he is off HCTZ now, he has intermittent stinging , he is taking prednisone prn, he needs a refill   Herpes genitalis: he is taking prophylactic medication and no recent episodes .   Pre-diabetes: he has a history of elevated glucose, last A1C was down to 5.8 % in Feb 2022 we will recheck labs, eating healthier   Decrease in exercise tolerance: he has noticed some SOB when he uses stairs. Discussed referral to cardiologist , he was seen by Dr. Saralyn Pilar back in 2019 , had normal stress Echo and holter, but he did not keep 6 months follow up . He is not interested in going back at this time   Dyslipidemia: he never started statin therapy, he is refusing it, trying to eat more at home   The 10-year ASCVD  risk score (Arnett DK, et al., 2019) is: 17.3%   Values used to calculate the score:     Age: 71 years     Sex: Male     Is Non-Hispanic African American: Yes     Diabetic: No     Tobacco smoker: No     Systolic Blood Pressure: 315 mmHg     Is BP treated: Yes     HDL Cholesterol: 50 mg/dL     Total Cholesterol: 197 mg/dL   Patient Active Problem List   Diagnosis Date Noted   Monoclonal gammopathy 07/13/2021   Chronic kidney disease 06/06/2021   Essential hypertension 06/06/2021   Chronic kidney disease 06/06/2021   Essential hypertension 06/06/2021   Sinus bradycardia 06/19/2018   Hyperlipidemia 11/02/2017   Herpes simplex infection of penis 11/01/2017   Erectile dysfunction 08/09/2016   Vitamin D deficiency 08/09/2016   Blurred vision, bilateral 04/27/2016   Degenerative joint disease (DJD) of hip  04/27/2016   Benign hypertension with chronic kidney disease, stage III ([place]) 01/26/2016   Chronic insomnia 01/26/2016   Bilateral leg edema 01/26/2016   History of colon cancer 06/10/2015    Past Surgical History:  Procedure Laterality Date   COLON SURGERY  06/26/2007   right hemicolectomy-T2N0 carcinoma of right colon. The tumor was 2.6 cm,low grade with zero of 14 nodes positive. No evidence of venous or lymphatic invasion   COLONOSCOPY  [id number]   COLONOSCOPY WITH PROPOFOL N/A 08/04/2015   Procedure: COLONOSCOPY WITH PROPOFOL;  Surgeon: [name], MD;  [facility];  Service: Endoscopy;  Laterality: N/A;   COLONOSCOPY WITH PROPOFOL N/A 09/01/2020   Procedure: COLONOSCOPY WITH PROPOFOL;  Surgeon: [name], MD;  Location: [facility] ENDOSCOPY;  Service: Endoscopy;  Laterality: N/A;    Family History  Problem Relation Age of Onset   Cancer Brother        cancer of the thymus   Diabetes Mother    Hypertension Father    Stroke Father    Cancer Maternal Aunt        breast cancer    Social History   Tobacco Use   Smoking status: Never   Smokeless tobacco: Never  Substance Use Topics   Alcohol use: Yes    Alcohol/week: 1.0 - 2.0 standard drink    Types: 1 - 2 Standard drinks or equivalent per week    Comment: Socially only - Very little     Current Outpatient Medications:    Ascorbic Acid (VITA-C PO), Take 1 tablet by mouth daily., Disp: , Rfl:    Flaxseed, Linseed, (FLAXSEED OIL) 1000 MG CAPS, Take by mouth., Disp: , Rfl:    loratadine (CLARITIN) 10 MG tablet, TAKE 1 TABLET BY MOUTH EVERY DAY, Disp: 90 tablet, Rfl: 1   sildenafil (VIAGRA) 100 MG tablet, Take 0.5-1 tablets (50-100 mg total) by mouth daily as needed for erectile dysfunction., Disp: 30 tablet, Rfl: 0   valACYclovir (VALTREX) 1000 MG tablet, Take 1 tablet (1,000 mg total) by mouth daily., Disp: 90 tablet, Rfl: 3   valsartan (DIOVAN) 160 MG tablet, TAKE 1 TABLET BY MOUTH EVERY DAY, Disp:  90 tablet, Rfl: 0   Vitamin D, Ergocalciferol, (DRISDOL) 1.25 MG (50000 UNIT) CAPS capsule, Take 1 capsule (50,000 Units total) by mouth every 7 (seven) days., Disp: 12 capsule, Rfl: 1   predniSONE (DELTASONE) 10 MG tablet, Take 1 tablet (10 mg total) by mouth daily with breakfast. (Patient not taking: Reported on 10/13/2021), Disp: 6 tablet, Rfl:  0   Suvorexant (BELSOMRA) 20 MG TABS, Take 20 mg by mouth at bedtime as needed. (Patient not taking: Reported on 10/13/2021), Disp: 90 tablet, Rfl: 1  Allergies  Allergen Reactions   Ace Inhibitors Swelling   Ciprofloxacin     Tendon damage    I personally reviewed active problem list, medication list, allergies, family history, social history, health maintenance with the patient/caregiver today.   ROS  Constitutional: Negative for fever or weight change.  Respiratory: Negative for cough and shortness of breath.   Cardiovascular: Negative for chest pain or palpitations.  Gastrointestinal: Negative for abdominal pain, no bowel changes.  Musculoskeletal: positive for gait problem but no  joint swelling.  Skin: Negative for rash.  Neurological: Negative for dizziness or headache.  No other specific complaints in a complete review of systems (except as listed in HPI above).   Objective  Vitals:   10/13/21 1035  BP: 124/80  Pulse: 87  Resp: 16  SpO2: 98%  Weight: 209 lb (94.8 kg)  Height: 5\' 11"  (1.803 m)    Body mass index is 29.15 kg/m.  Physical Exam  Constitutional: Patient appears well-developed and well-nourished.  No distress.  HEENT: head atraumatic, normocephalic, pupils equal and reactive to light,  neck supple Cardiovascular: Normal rate, regular rhythm and normal heart sounds.  No murmur heard. No BLE edema. Pulmonary/Chest: Effort normal and breath sounds normal. No respiratory distress. Abdominal: Soft.  There is no tenderness. Psychiatric: Patient has a normal mood and affect. behavior is normal. Judgment and thought  content normal.    PHQ2/9: Depression screen [facility] 2/9 10/13/2021 07/28/2021 04/29/2021 04/15/2021 03/10/2021  Decreased Interest 0 0 0 0 0  Down, Depressed, Hopeless 0 0 0 0 0  PHQ - 2 Score 0 0 0 0 0  Altered sleeping 0 - - - -  Tired, decreased energy 0 - - - -  Change in appetite 0 - - - -  Feeling bad or failure about yourself  0 - - - -  Trouble concentrating 0 - - - -  Moving slowly or fidgety/restless 0 - - - -  Suicidal thoughts 0 - - - -  PHQ-9 Score 0 - - - -  Difficult doing work/chores - - - - -  Some recent data might be hidden    phq 9 is negative   Fall Risk: Fall Risk  10/13/2021 07/28/2021 04/29/2021 04/15/2021 03/10/2021  Falls in the past year? 0 0 0 0 0  Number falls in past yr: 0 0 0 0 0  Injury with Fall? 0 0 0 0 0  Risk for fall due to : No Fall Risks No Fall Risks No Fall Risks - -  Follow up Falls prevention discussed Falls prevention discussed Falls prevention discussed Falls evaluation completed Falls evaluation completed      Functional Status Survey: Is the patient deaf or have difficulty hearing?: Yes Does the patient have difficulty seeing, even when wearing glasses/contacts?: No Does the patient have difficulty concentrating, remembering, or making decisions?: No Does the patient have difficulty walking or climbing stairs?: No Does the patient have difficulty dressing or bathing?: No Does the patient have difficulty doing errands alone such as visiting a doctor's office or shopping?: No    Assessment & Plan  1. Stage 3a chronic kidney disease (HCC)  - valsartan (DIOVAN) 160 MG tablet; Take 1 tablet (160 mg total) by mouth daily.  Dispense: 90 tablet; Refill: 1  2. Pure hypercholesterolemia  Refuses statin therapy  3. Essential hypertension  - valsartan (DIOVAN) 160 MG tablet; Take 1 tablet (160 mg total) by mouth daily.  Dispense: 90 tablet; Refill: 1  4. Need for shingles vaccine  - Zoster Vaccine Adjuvanted [facility]) injection; Inject  0.5 mLs into the muscle once for 1 dose.  Dispense: 0.5 mL; Refill: 1  5. Benign hypertension with chronic kidney disease, stage III (HCC)  - valsartan (DIOVAN) 160 MG tablet; Take 1 tablet (160 mg total) by mouth daily.  Dispense: 90 tablet; Refill: 1  6. Vitamin D deficiency  - Vitamin D, Ergocalciferol, (DRISDOL) 1.25 MG (50000 UNIT) CAPS capsule; Take 1 capsule (50,000 Units total) by mouth every 7 (seven) days.  Dispense: 12 capsule; Refill: 1  7. BPH with obstruction/lower urinary tract symptoms   8. Hyperglycemia   9. Chronic hip pain, bilateral   10. Herpes genitalis in men  - valACYclovir (VALTREX) 1000 MG tablet; Take 1 tablet (1,000 mg total) by mouth daily.  Dispense: 90 tablet; Refill: 3  11. Erectile dysfunction, unspecified erectile dysfunction type  - Ambulatory referral to Urology  12. 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  Dispense: 6 tablet; Refill: 1 - febuxostat (ULORIC) 40 MG tablet; Take 1 tablet (40 mg total) by mouth daily.  Dispense: 90 tablet; Refill: 1

## 2021-10-13 ENCOUNTER — Encounter: Payer: Self-pay | Admitting: Family Medicine

## 2021-10-13 ENCOUNTER — Ambulatory Visit (INDEPENDENT_AMBULATORY_CARE_PROVIDER_SITE_OTHER): Payer: Medicare HMO | Admitting: Family Medicine

## 2021-10-13 ENCOUNTER — Other Ambulatory Visit: Payer: Self-pay | Admitting: Family Medicine

## 2021-10-13 VITALS — BP 124/80 | HR 87 | Resp 16 | Ht 71.0 in | Wt 209.0 lb

## 2021-10-13 DIAGNOSIS — I10 Essential (primary) hypertension: Secondary | ICD-10-CM

## 2021-10-13 DIAGNOSIS — Z23 Encounter for immunization: Secondary | ICD-10-CM | POA: Diagnosis not present

## 2021-10-13 DIAGNOSIS — M25551 Pain in right hip: Secondary | ICD-10-CM | POA: Diagnosis not present

## 2021-10-13 DIAGNOSIS — E559 Vitamin D deficiency, unspecified: Secondary | ICD-10-CM

## 2021-10-13 DIAGNOSIS — E7800 Pure hypercholesterolemia, unspecified: Secondary | ICD-10-CM

## 2021-10-13 DIAGNOSIS — M10371 Gout due to renal impairment, right ankle and foot: Secondary | ICD-10-CM

## 2021-10-13 DIAGNOSIS — R739 Hyperglycemia, unspecified: Secondary | ICD-10-CM

## 2021-10-13 DIAGNOSIS — M25552 Pain in left hip: Secondary | ICD-10-CM

## 2021-10-13 DIAGNOSIS — A6002 Herpesviral infection of other male genital organs: Secondary | ICD-10-CM

## 2021-10-13 DIAGNOSIS — N401 Enlarged prostate with lower urinary tract symptoms: Secondary | ICD-10-CM

## 2021-10-13 DIAGNOSIS — R69 Illness, unspecified: Secondary | ICD-10-CM | POA: Diagnosis not present

## 2021-10-13 DIAGNOSIS — G8929 Other chronic pain: Secondary | ICD-10-CM

## 2021-10-13 DIAGNOSIS — N529 Male erectile dysfunction, unspecified: Secondary | ICD-10-CM

## 2021-10-13 DIAGNOSIS — N1831 Chronic kidney disease, stage 3a: Secondary | ICD-10-CM | POA: Diagnosis not present

## 2021-10-13 DIAGNOSIS — I129 Hypertensive chronic kidney disease with stage 1 through stage 4 chronic kidney disease, or unspecified chronic kidney disease: Secondary | ICD-10-CM

## 2021-10-13 DIAGNOSIS — N183 Chronic kidney disease, stage 3 unspecified: Secondary | ICD-10-CM

## 2021-10-13 DIAGNOSIS — N138 Other obstructive and reflux uropathy: Secondary | ICD-10-CM

## 2021-10-13 MED ORDER — PREDNISONE 10 MG PO TABS: 10.0000 mg | ORAL_TABLET | Freq: Every day | ORAL | 1 refills | Status: DC

## 2021-10-13 MED ORDER — VALACYCLOVIR HCL 1 G PO TABS: 1000.0000 mg | ORAL_TABLET | Freq: Every day | ORAL | 3 refills | Status: DC

## 2021-10-13 MED ORDER — VITAMIN D (ERGOCALCIFEROL) 1.25 MG (50000 UNIT) PO CAPS: 50000.0000 [IU] | ORAL_CAPSULE | ORAL | 1 refills | Status: DC

## 2021-10-13 MED ORDER — SHINGRIX 50 MCG/0.5ML IM SUSR: 0.5000 mL | Freq: Once | INTRAMUSCULAR | 1 refills | Status: AC

## 2021-10-13 MED ORDER — FEBUXOSTAT 40 MG PO TABS: 40.0000 mg | ORAL_TABLET | Freq: Every day | ORAL | 1 refills | Status: DC

## 2021-10-13 MED ORDER — VALSARTAN 160 MG PO TABS: 160.0000 mg | ORAL_TABLET | Freq: Every day | ORAL | 1 refills | Status: DC

## 2021-10-13 NOTE — Telephone Encounter (Signed)
Requested medications are due for refill today.  yes  Requested medications are on the active medications list.  yes  Last refill. 07/17/2021 #90 0 refills  Future visit scheduled.   yes  Notes to clinic.  Refill failed protocol d/t abnormal labs.    Requested Prescriptions  Pending Prescriptions Disp Refills   valsartan (DIOVAN) 160 MG tablet [Pharmacy Med Name: VALSARTAN 160 MG TABLET] 90 tablet 0    Sig: TAKE 1 TABLET BY MOUTH EVERY DAY     Cardiovascular:  Angiotensin Receptor Blockers Failed - 10/13/2021  1:46 AM      Failed - Cr in normal range and within 180 days    Creat  Date Value Ref Range Status  04/15/2021 1.90 (H) 0.70 - 1.35 mg/dL Final   Creatinine, Urine  Date Value Ref Range Status  10/15/2020 174 20 - 320 mg/dL Final          Failed - K in normal range and within 180 days    Potassium  Date Value Ref Range Status  04/15/2021 4.4 3.5 - 5.3 mmol/L Final          Failed - Last BP in normal range    BP Readings from Last 1 Encounters:  07/13/21 (!) 159/81          Passed - Patient is not pregnant      Passed - Valid encounter within last 6 months    Recent Outpatient Visits           5 months ago Well adult exam   [facility] [place], Drue Stager, MD   6 months ago Stage 3a chronic kidney disease [facility])   [facility] Steele Sizer, MD   7 months ago Right ankle swelling   [facility] [place], Malachy Mood, NP   12 months ago Benign hypertension with chronic kidney disease, stage III [facility])   [facility] Steele Sizer, MD   1 year ago Benign hypertension with chronic kidney disease, stage III [facility])   [facility] Steele Sizer, MD       Future Appointments             Today Steele Sizer, MD [facility], [place]   In 9 months  [facility], [facility]

## 2021-10-14 ENCOUNTER — Telehealth: Payer: Self-pay | Admitting: Family Medicine

## 2021-10-14 ENCOUNTER — Other Ambulatory Visit: Payer: Self-pay | Admitting: Family Medicine

## 2021-10-14 DIAGNOSIS — M10371 Gout due to renal impairment, right ankle and foot: Secondary | ICD-10-CM

## 2021-10-15 ENCOUNTER — Other Ambulatory Visit: Payer: Self-pay | Admitting: Family Medicine

## 2021-10-15 DIAGNOSIS — M10371 Gout due to renal impairment, right ankle and foot: Secondary | ICD-10-CM

## 2021-10-15 NOTE — Telephone Encounter (Signed)
Refusing due to duplicate request. Requested Prescriptions  Pending Prescriptions Disp Refills   febuxostat (ULORIC) 40 MG tablet [Pharmacy Med Name: FEBUXOSTAT 40 MG TABLET] 90 tablet 1    Sig: TAKE 1 TABLET BY [place] DAY     Endocrinology: Gout Agents - febuxostat Failed - 10/14/2021  5:28 PM      Failed - Uric Acid in normal range and within 360 days    Uric Acid, Serum  Date Value Ref Range Status  03/10/2021 9.2 (H) 4.0 - 8.0 mg/dL Final    Comment:    Therapeutic target for gout patients: <6.0 mg/dL .          Failed - Cr in normal range and within 360 days    Creat  Date Value Ref Range Status  04/15/2021 1.90 (H) 0.70 - 1.35 mg/dL Final   Creatinine, Urine  Date Value Ref Range Status  10/15/2020 174 20 - 320 mg/dL Final         Passed - AST in normal range and within 360 days    AST  Date Value Ref Range Status  03/10/2021 19 10 - 35 U/L Final         Passed - ALT in normal range and within 360 days    ALT  Date Value Ref Range Status  03/10/2021 23 9 - 46 U/L Final         Passed - Valid encounter within last 12 months    Recent Outpatient Visits          2 days ago Stage 3a chronic kidney disease [facility])   [facility] Steele Sizer, MD   5 months ago Well adult exam   [facility] Steele Sizer, MD   6 months ago Stage 3a chronic kidney disease [facility])   [facility] Steele Sizer, MD   7 months ago Right ankle swelling   [facility] [place], Malachy Mood, NP   1 year ago Benign hypertension with chronic kidney disease, stage III [facility])   [facility] Steele Sizer, MD      Future Appointments            In 6 months Ancil Boozer, Drue Stager, MD [facility], [place]   In 9 months  [facility], [facility]

## 2021-10-15 NOTE — Telephone Encounter (Signed)
Refusing due to duplicate request. Requested Prescriptions  Pending Prescriptions Disp Refills   febuxostat (ULORIC) 40 MG tablet [Pharmacy Med Name: FEBUXOSTAT 40 MG TABLET] 90 tablet 1    Sig: TAKE 1 TABLET BY [place] DAY     Endocrinology: Gout Agents - febuxostat Failed - 10/14/2021  5:28 PM      Failed - Uric Acid in normal range and within 360 days    Uric Acid, Serum  Date Value Ref Range Status  03/10/2021 9.2 (H) 4.0 - 8.0 mg/dL Final    Comment:    Therapeutic target for gout patients: <6.0 mg/dL .           Failed - Cr in normal range and within 360 days    Creat  Date Value Ref Range Status  04/15/2021 1.90 (H) 0.70 - 1.35 mg/dL Final   Creatinine, Urine  Date Value Ref Range Status  10/15/2020 174 20 - 320 mg/dL Final          Passed - AST in normal range and within 360 days    AST  Date Value Ref Range Status  03/10/2021 19 10 - 35 U/L Final          Passed - ALT in normal range and within 360 days    ALT  Date Value Ref Range Status  03/10/2021 23 9 - 46 U/L Final          Passed - Valid encounter within last 12 months    Recent Outpatient Visits           2 days ago Stage 3a chronic kidney disease [facility])   [facility] Steele Sizer, MD   5 months ago Well adult exam   [facility] Steele Sizer, MD   6 months ago Stage 3a chronic kidney disease [facility])   [facility] Steele Sizer, MD   7 months ago Right ankle swelling   [facility] [place], Malachy Mood, NP   1 year ago Benign hypertension with chronic kidney disease, stage III [facility])   [facility] Steele Sizer, MD       Future Appointments             In 6 months Ancil Boozer, Drue Stager, MD [facility], [place]   In 9 months  [facility], [facility]

## 2021-10-21 ENCOUNTER — Ambulatory Visit: Payer: Medicare HMO | Admitting: Urology

## 2021-10-21 ENCOUNTER — Other Ambulatory Visit: Payer: Self-pay

## 2021-10-21 ENCOUNTER — Encounter: Payer: Self-pay | Admitting: Urology

## 2021-10-21 ENCOUNTER — Ambulatory Visit
Admission: RE | Admit: 2021-10-21 | Discharge: 2021-10-21 | Disposition: A | Discharge status: Home / Self Care | Payer: Medicare HMO | Source: Ambulatory Visit | Attending: Orthopedic Surgery | Admitting: Orthopedic Surgery

## 2021-10-21 VITALS — BP 167/83 | HR 62 | Ht 71.0 in | Wt 209.0 lb

## 2021-10-21 DIAGNOSIS — N5201 Erectile dysfunction due to arterial insufficiency: Secondary | ICD-10-CM

## 2021-10-21 DIAGNOSIS — N401 Enlarged prostate with lower urinary tract symptoms: Secondary | ICD-10-CM

## 2021-10-21 DIAGNOSIS — N400 Enlarged prostate without lower urinary tract symptoms: Secondary | ICD-10-CM

## 2021-10-21 DIAGNOSIS — M5416 Radiculopathy, lumbar region: Secondary | ICD-10-CM | POA: Insufficient documentation

## 2021-10-21 DIAGNOSIS — M47816 Spondylosis without myelopathy or radiculopathy, lumbar region: Secondary | ICD-10-CM | POA: Diagnosis not present

## 2021-10-21 LAB — BLADDER SCAN AMB NON-IMAGING: Scan Result: 28

## 2021-10-21 MED ORDER — AMBULATORY NON FORMULARY MEDICATION
0 refills | Status: DC
Start: 2021-10-21 — End: 2022-09-06

## 2021-10-21 MED ORDER — TAMSULOSIN HCL 0.4 MG PO CAPS: 0.4000 mg | ORAL_CAPSULE | Freq: Every day | ORAL | 0 refills | Status: DC

## 2021-10-21 NOTE — Progress Notes (Signed)
10/21/2021 9:15 AM   Frederick Nelson 04-25-1951 [id number]  Referring provider: Steele Sizer, MD [address] [place],   [id number]  Chief Complaint  Patient presents with   Erectile Dysfunction    HPI: Frederick Nelson is a 71 y.o. male referred for evaluation of erectile dysfunction.  History of ED on sildenafil which was effective initially however recently is no longer effective Taking max dose 100 mg Erections are really firm enough for penetration and he has difficulty maintaining He has tried a vacuum erection device which was not effective No pain or curvature with erection Organic risk factors include hypertension, antihypertensive medication, chronic kidney disease, hyperlipidemia  He also complains of bothersome lower urinary tract symptoms including urinary frequency, urgency, hesitancy and occasional sensation of incomplete emptying No previous treatments for BPH Denies dysuria, gross hematuria No flank, abdominal or pelvic pain IPSS today 18/35     PMH: Past Medical History:  Diagnosis Date   Abdominal pain, right lower quadrant    Allergy    Arthritis    Benign hypertension with chronic kidney disease, stage III (HCC)    Chronic low back pain    CKD (chronic kidney disease), stage III (HCC)    Degenerative joint disease (DJD) of hip    Genital herpes    Takes Valtrex for prevention   GERD (gastroesophageal reflux disease) 1995   Hyperlipidemia    Hypertension    Malignant neoplasm of ascending colon ([place]) 2011   Special screening for malignant neoplasms, colon 2011    Surgical History: Past Surgical History:  Procedure Laterality Date   COLON SURGERY  06/26/2007   right hemicolectomy-T2N0 carcinoma of right colon. The tumor was 2.6 cm,low grade with zero of 14 nodes positive. No evidence of venous or lymphatic invasion   COLONOSCOPY  [id number]   COLONOSCOPY WITH PROPOFOL N/A 08/04/2015   Procedure: COLONOSCOPY WITH  PROPOFOL;  Surgeon: [name], MD;  Location: [facility] ENDOSCOPY;  Service: Endoscopy;  Laterality: N/A;   COLONOSCOPY WITH PROPOFOL N/A 09/01/2020   Procedure: COLONOSCOPY WITH PROPOFOL;  Surgeon: [name], MD;  Location: [facility] ENDOSCOPY;  Service: Endoscopy;  Laterality: N/A;    Home Medications:  Allergies as of 10/21/2021       Reactions   Ace Inhibitors Swelling   Ciprofloxacin    Tendon damage        Medication List        Accurate as of October 21, 2021  9:15 AM. If you have any questions, ask your nurse or doctor.          febuxostat 40 MG tablet Commonly known as: Uloric Take 1 tablet (40 mg total) by mouth daily.   Flaxseed Oil 1000 MG Caps Take by mouth.   loratadine 10 MG tablet Commonly known as: CLARITIN TAKE 1 TABLET BY MOUTH EVERY DAY   predniSONE 10 MG tablet Commonly known as: DELTASONE Take 1 tablet (10 mg total) by mouth daily with breakfast.   sildenafil 100 MG tablet Commonly known as: Viagra Take 0.5-1 tablets (50-100 mg total) by mouth daily as needed for erectile dysfunction.   valACYclovir 1000 MG tablet Commonly known as: VALTREX Take 1 tablet (1,000 mg total) by mouth daily.   valsartan 160 MG tablet Commonly known as: DIOVAN Take 1 tablet (160 mg total) by mouth daily.   VITA-C PO Take 1 tablet by mouth daily.   Vitamin D (Ergocalciferol) 1.25 MG (50000 UNIT) Caps capsule Commonly known as: DRISDOL Take  1 capsule (50,000 Units total) by mouth every 7 (seven) days.        Allergies:  Allergies  Allergen Reactions   Ace Inhibitors Swelling   Ciprofloxacin     Tendon damage    Family History: Family History  Problem Relation Age of Onset   Cancer Brother        cancer of the thymus   Diabetes Mother    Hypertension Father    Stroke Father    Cancer Maternal Aunt        breast cancer    Social History:  reports that he has never smoked. He has never used smokeless tobacco. He reports current  alcohol use of about 1.0 - 2.0 standard drink per week. He reports that he does not use drugs.   Physical Exam: BP (!) 167/83    Pulse 62    Ht 5\' 11"  (1.803 m)    Wt 209 lb (94.8 kg)    BMI 29.15 kg/m   Constitutional:  Alert and oriented, No acute distress. HEENT: [place] AT, moist mucus membranes.  Trachea midline, no masses. Cardiovascular: No clubbing, cyanosis, or edema. Respiratory: Normal respiratory effort, no increased work of breathing. Psychiatric: Normal mood and affect.   Assessment & Plan:    1.  Erectile dysfunction PDE 5 refractory Vacuum erection device not effective Other options were discussed including intracavernosal injections and penile implant surgery.  He was provided literature.  He would like to pursue intracavernosal injections and appointment was made with [facility] for training  2.  BPH with LUTS Moderate to severe lower urinary tract symptoms Bladder scan PVR 28 mL He was interested in trial of medical management and Rx tamsulosin sent to pharmacy PSA 04/2021 stable 1.25   Abbie Sons, MD  [place] [address], [place], [place] [address] [phone]

## 2021-10-26 DIAGNOSIS — M5416 Radiculopathy, lumbar region: Secondary | ICD-10-CM | POA: Diagnosis not present

## 2021-11-16 DIAGNOSIS — D472 Monoclonal gammopathy: Secondary | ICD-10-CM | POA: Diagnosis not present

## 2021-11-16 DIAGNOSIS — G8929 Other chronic pain: Secondary | ICD-10-CM | POA: Diagnosis not present

## 2021-11-16 DIAGNOSIS — I10 Essential (primary) hypertension: Secondary | ICD-10-CM | POA: Diagnosis not present

## 2021-11-16 DIAGNOSIS — M5442 Lumbago with sciatica, left side: Secondary | ICD-10-CM | POA: Diagnosis not present

## 2021-11-16 DIAGNOSIS — N1831 Chronic kidney disease, stage 3a: Secondary | ICD-10-CM | POA: Diagnosis not present

## 2021-11-16 DIAGNOSIS — M5441 Lumbago with sciatica, right side: Secondary | ICD-10-CM | POA: Diagnosis not present

## 2021-11-18 ENCOUNTER — Ambulatory Visit: Payer: Medicare HMO | Admitting: Urology

## 2021-11-18 NOTE — Progress Notes (Signed)
? ?  11/22/2021 ?9:43 AM ? ?[place] ?08-Nov-1950 ?[id number] ? ? ?Referring provider: Steele Sizer, MD ?[facility] ?[address] ?[place],  [place] [address] ? ? ?Chief Complaint  ?Patient presents with  ? Benign Prostatic Hypertrophy  ? ?Urological history: ?Erectile Dysfunction  ?- risk factors include hypertension, antihypertensive medication, chronic kidney disease, hyperlipidemia ?- Vacuum erection device was ineffective  ?- Sildenafil was initially effective; PDE 5 refractory   ? ?2. BPH with LUTS  ?- Managed on Tamsulosin  ? ? ?HPI: ?Frederick Nelson is a 71 y.o. male who presents today for prostaglandin injection teaching.  ? ? ? ?Physical Exam:  ?BP 128/70   Pulse 65   Ht '5\' 11"'$  (1.803 m)   Wt 207 lb (93.9 kg)   BMI 28.87 kg/m?   ?Constitutional:  Well nourished. Alert and oriented, No acute distress. ?GU: No CVA tenderness.  No bladder fullness or masses.  Patient with uncircumcised phallus. Foreskin easily retracted  Urethral meatus is patent.  No penile discharge. No penile lesions or rashes. Scrotum without lesions, cysts, rashes and/or edema.   ?Psychiatric: Normal mood and affect. ? ? ?Procedure: ?Patient's left corpus cavernosum is identified.  An area near the base of the penis is cleansed with rubbing alcohol.  Careful to avoid the dorsal vein,  2 mcg of  prostaglandin E1 10 mcg, Lot # [id number] '@18'$  exp # 12/29/2021 is injected at a 90 degree angle into the left  corpus cavernosum near the base of the penis.  ? ?2 mcg of  prostaglandin E1 10 mcg , Lot # [id number] '@18'$  exp # 12/29/2021 is injected at a 90 degree angle into the right corpus cavernosum near the base of the penis.  ? ?2 mcg of  prostaglandin E1 10 mcg, Lot # [id number] '@18'$  exp # 12/29/2021 is injected at a 90 degree angle into the left corpus cavernosum near the base of the penis again. He was not able to have a firm erection.  ? ?Assessment & Plan:   ?1. Erectile dysfunction  ?- He was unable to gain an erection after 6 mcg of  prostaglandin E1 was injected today.  ?- He will inject the 6 mcg of prostaglandin at home if this is effective he will call and we will prescribe prostaglandin. If it is not effective he will return for trimix titration.  ?-Advised patient of the condition of priapism, painful erection lasting for more than four hours, and to contact the office immediately or seek treatment in the ED  ?  ?Return if symptoms worsen or fail to improve. ? ?Nori Riis, PA-C ? ?[facility] ?[place], [place] [address] ?[phone] ? ?I,Rowyn Spilde,acting as a scribe for [employer], PA-C.,have documented all relevant documentation on the behalf of [place], PA-C,as directed by  [facility], PA-C while in the presence of [place], PA-C. ? ?I have reviewed the above documentation for accuracy and completeness, and I agree with the above.   ? ?Zara Council, PA-C  ? ?I spent 15 minutes on the day of the encounter to include pre-visit record review, face-to-face time with the patient, and post-visit ordering of tests.  ?

## 2021-11-21 ENCOUNTER — Telehealth: Payer: Self-pay | Admitting: Urology

## 2021-11-21 NOTE — Telephone Encounter (Signed)
Please confirm with [name] that he has his medication from [facility] for tomorrow's appointment and to make sure he takes his BP meds because if his BP is high, we cannot proceed with the injections.  ?

## 2021-11-21 NOTE — Telephone Encounter (Signed)
Spoke with patient and he will pick up medication this afternoon, and will take his BP meds in the morning ?

## 2021-11-22 ENCOUNTER — Other Ambulatory Visit: Payer: Self-pay

## 2021-11-22 ENCOUNTER — Ambulatory Visit: Payer: Medicare HMO | Admitting: Urology

## 2021-11-22 ENCOUNTER — Encounter: Payer: Self-pay | Admitting: Urology

## 2021-11-22 VITALS — BP 128/70 | HR 65 | Ht 71.0 in | Wt 207.0 lb

## 2021-11-22 DIAGNOSIS — N5201 Erectile dysfunction due to arterial insufficiency: Secondary | ICD-10-CM

## 2021-11-22 NOTE — Patient Instructions (Signed)

## 2021-11-23 ENCOUNTER — Other Ambulatory Visit: Payer: Self-pay | Admitting: Urology

## 2021-11-24 DIAGNOSIS — M5442 Lumbago with sciatica, left side: Secondary | ICD-10-CM | POA: Diagnosis not present

## 2021-11-24 DIAGNOSIS — M5441 Lumbago with sciatica, right side: Secondary | ICD-10-CM | POA: Diagnosis not present

## 2021-11-24 DIAGNOSIS — G8929 Other chronic pain: Secondary | ICD-10-CM | POA: Diagnosis not present

## 2021-12-26 IMAGING — US US RENAL
1 series · 14 of 25 positions shown · non-contrast
Comparison: None.

CLINICAL DATA: Stage 3b chronic kidney disease (HCC)

EXAM:
RENAL / URINARY TRACT ULTRASOUND COMPLETE

[Series 1: us renal · 14 of 35 slices shown]
[im 1/35]
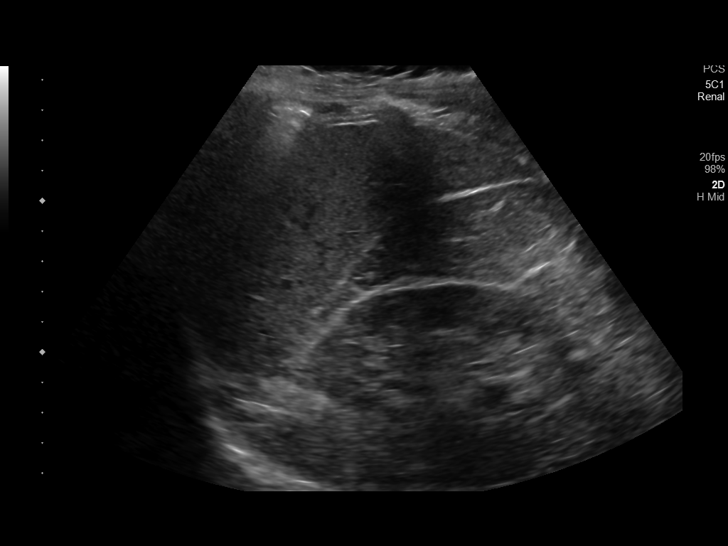
[im 3/35]
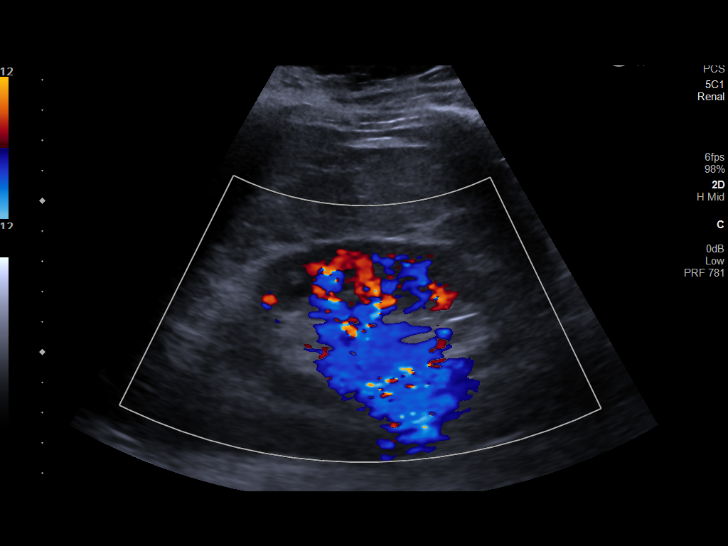
[im 6/35]
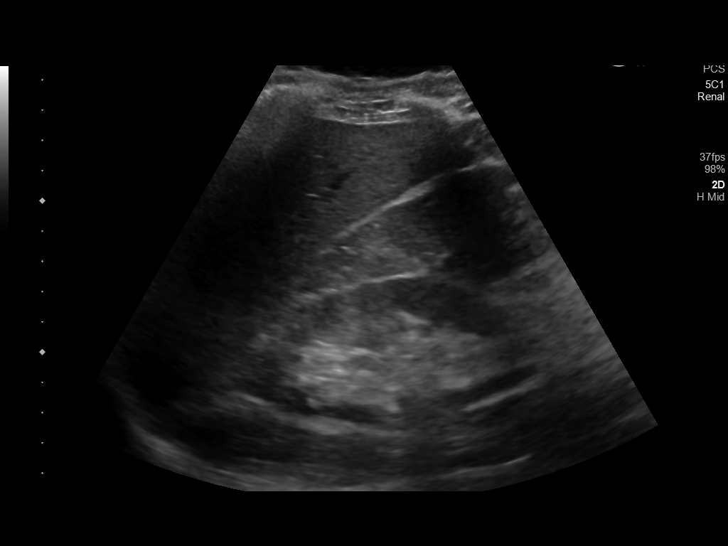
[im 9/35]
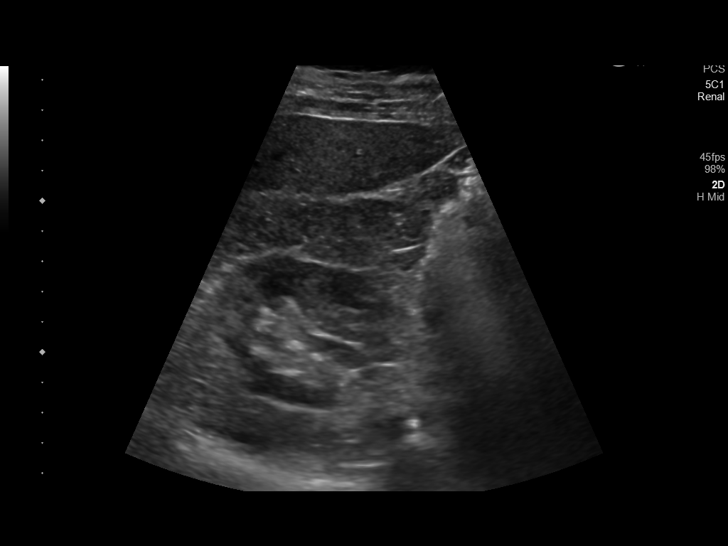
[im 12/35]
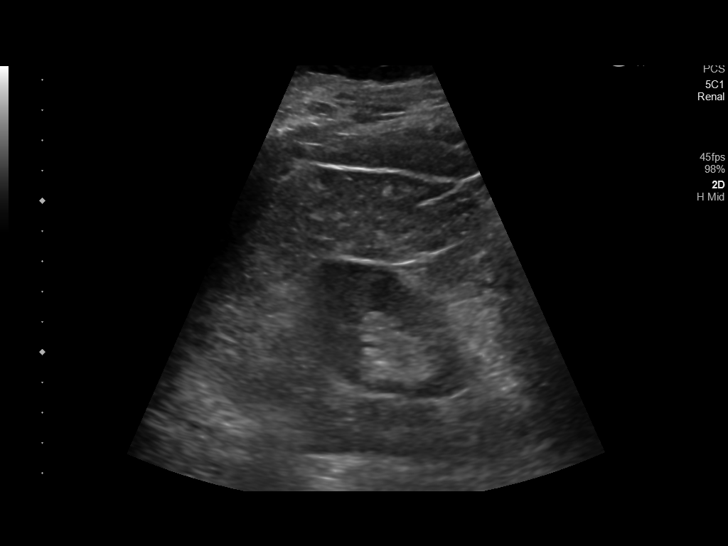
[im 13/35]
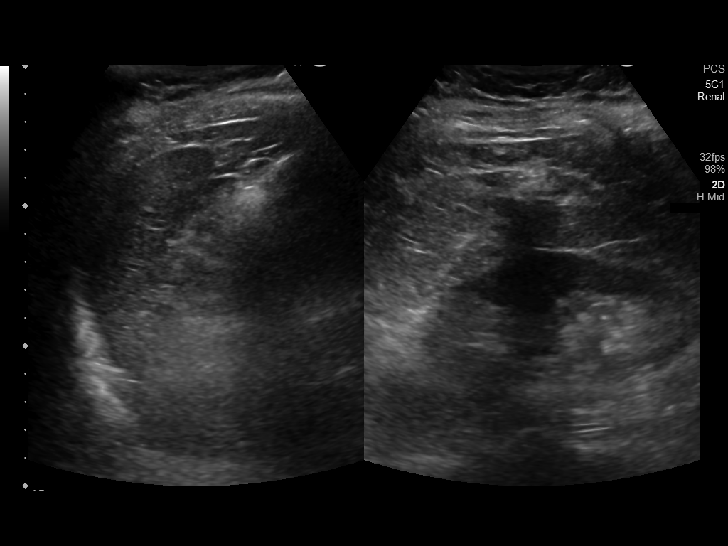
[im 16/35]
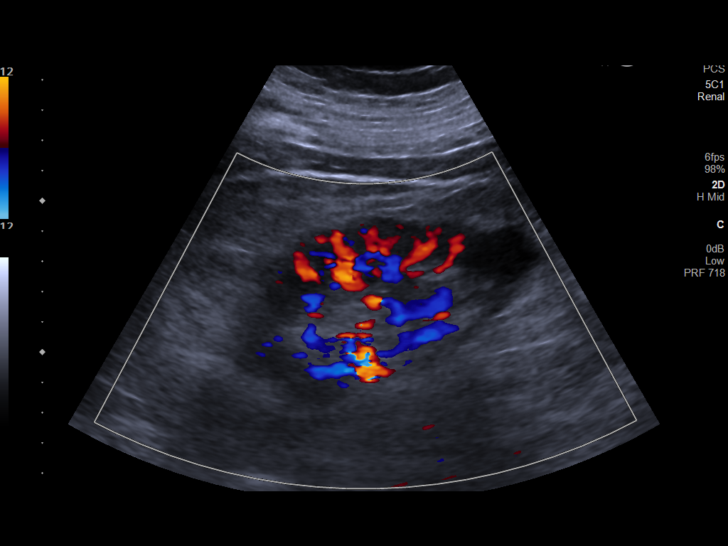
[im 19/35]
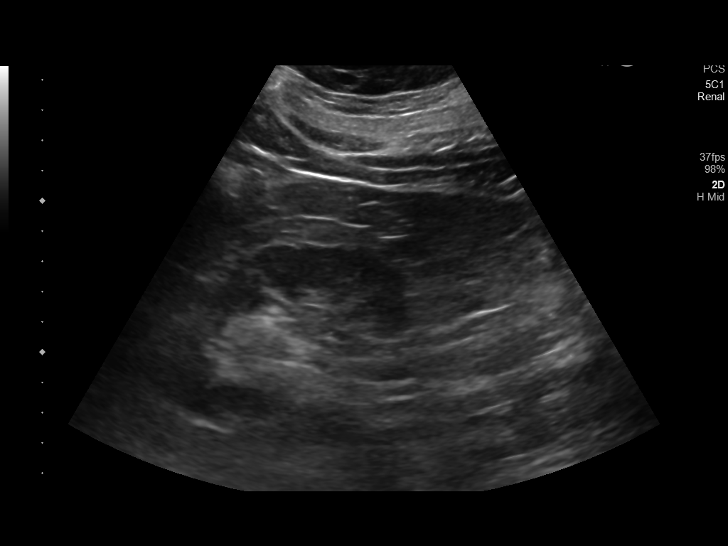
[im 22/35]
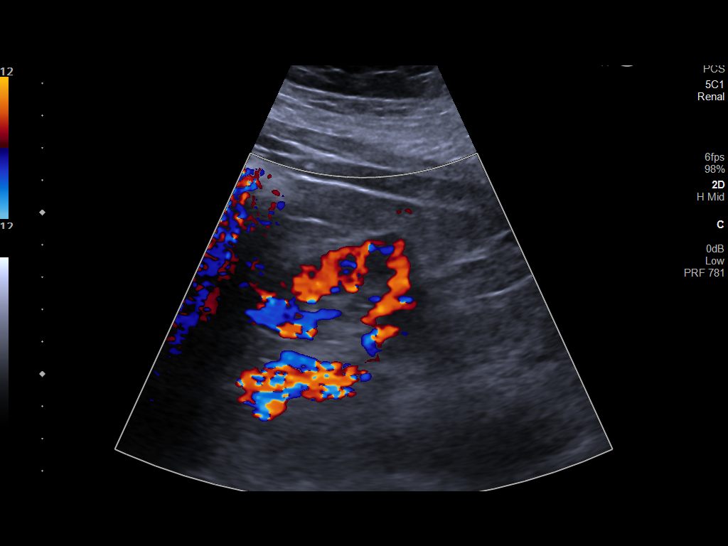
[im 23/35]
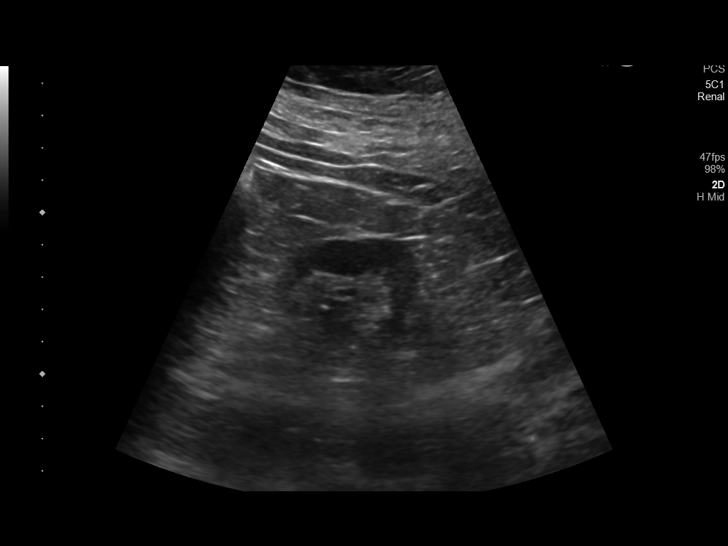
[im 26/35]
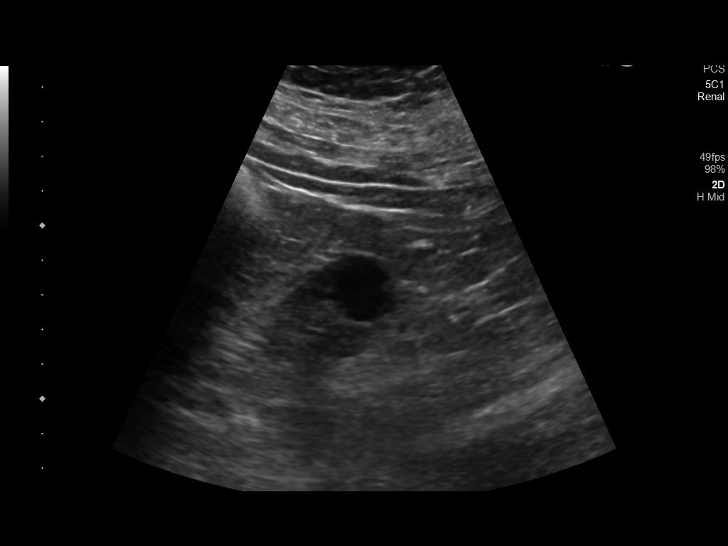
[im 29/35]
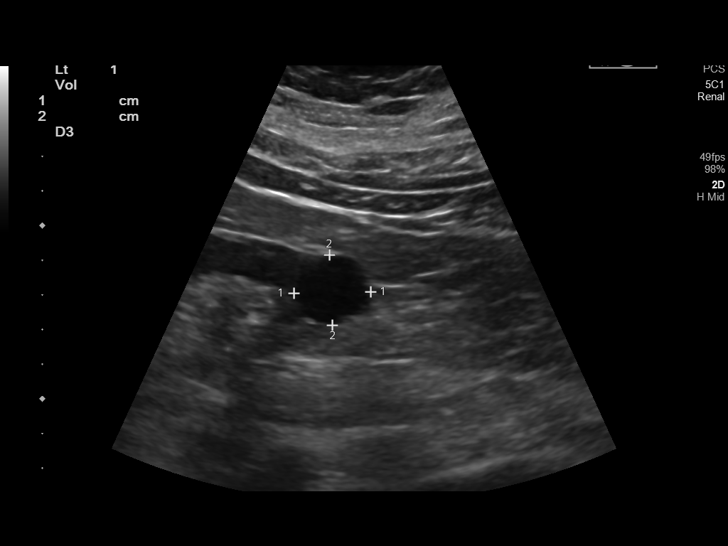
[im 32/35]
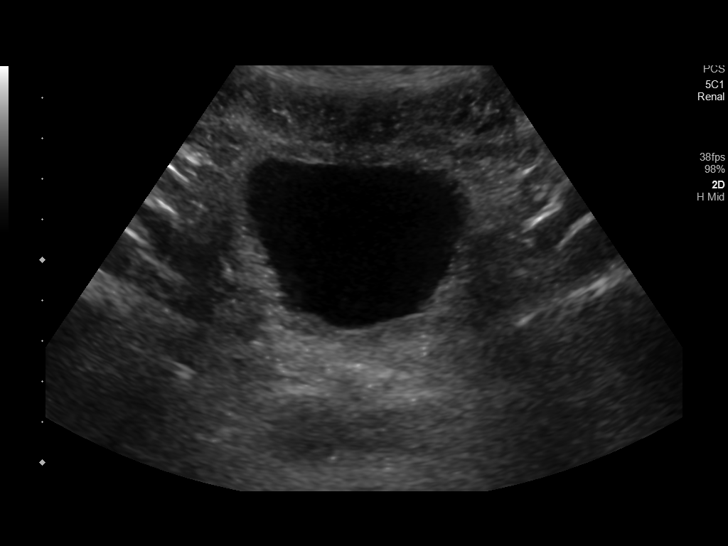
[im 35/35]
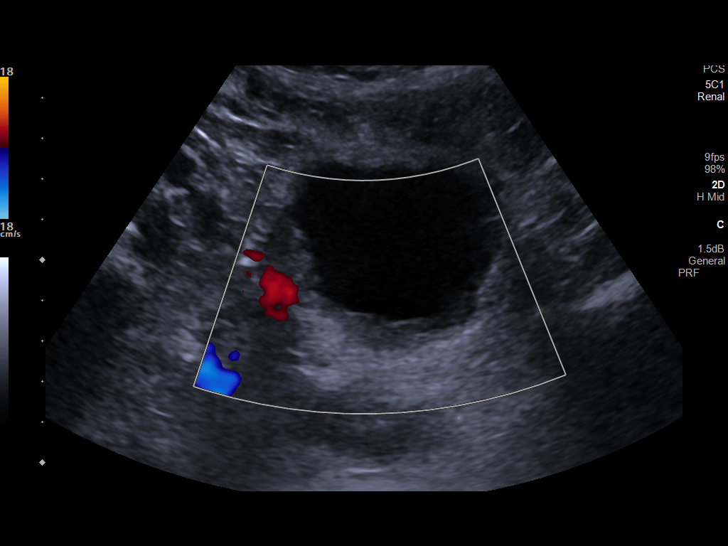

[14 of 25 positions shown; findings below may reference images not displayed]

FINDINGS: Right Kidney:

Renal measurements: 10.0 x 5.3 x 5.3 cm = volume: 147 mL.
Echogenicity within normal limits. No mass or hydronephrosis
visualized.

Left Kidney:

Renal measurements: 9.6 x 5.3 x 4.3 cm = volume: 116 mL.
Echogenicity within normal limits. No mass or hydronephrosis
visualized. A 2.2 cm simple cortical cyst is seen within the lower
pole of the left kidney.

Bladder:

Appears normal for degree of bladder distention.

Other:

None.
IMPRESSION: Normal renal sonogram

## 2022-01-04 ENCOUNTER — Inpatient Hospital Stay: Payer: Medicare HMO

## 2022-01-11 ENCOUNTER — Ambulatory Visit: Payer: Medicare HMO | Admitting: Internal Medicine

## 2022-01-16 ENCOUNTER — Emergency Department: Payer: Medicare HMO

## 2022-01-16 ENCOUNTER — Encounter: Payer: Self-pay | Admitting: Emergency Medicine

## 2022-01-16 DIAGNOSIS — I10 Essential (primary) hypertension: Secondary | ICD-10-CM | POA: Diagnosis not present

## 2022-01-16 DIAGNOSIS — N400 Enlarged prostate without lower urinary tract symptoms: Secondary | ICD-10-CM | POA: Insufficient documentation

## 2022-01-16 DIAGNOSIS — Z79899 Other long term (current) drug therapy: Secondary | ICD-10-CM | POA: Insufficient documentation

## 2022-01-16 DIAGNOSIS — R200 Anesthesia of skin: Secondary | ICD-10-CM | POA: Diagnosis not present

## 2022-01-16 DIAGNOSIS — G459 Transient cerebral ischemic attack, unspecified: Principal | ICD-10-CM | POA: Insufficient documentation

## 2022-01-16 DIAGNOSIS — Z85038 Personal history of other malignant neoplasm of large intestine: Secondary | ICD-10-CM | POA: Diagnosis not present

## 2022-01-16 DIAGNOSIS — R531 Weakness: Secondary | ICD-10-CM | POA: Diagnosis not present

## 2022-01-16 DIAGNOSIS — R202 Paresthesia of skin: Secondary | ICD-10-CM | POA: Diagnosis not present

## 2022-01-16 DIAGNOSIS — N1831 Chronic kidney disease, stage 3a: Secondary | ICD-10-CM | POA: Diagnosis not present

## 2022-01-16 DIAGNOSIS — I129 Hypertensive chronic kidney disease with stage 1 through stage 4 chronic kidney disease, or unspecified chronic kidney disease: Secondary | ICD-10-CM | POA: Diagnosis not present

## 2022-01-16 DIAGNOSIS — I639 Cerebral infarction, unspecified: Secondary | ICD-10-CM | POA: Diagnosis not present

## 2022-01-16 DIAGNOSIS — I6381 Other cerebral infarction due to occlusion or stenosis of small artery: Secondary | ICD-10-CM | POA: Diagnosis not present

## 2022-01-16 LAB — CBC
HCT: 43.1 % (ref 39.0–52.0)
Hemoglobin: 14.1 g/dL (ref 13.0–17.0)
MCH: 30.7 pg (ref 26.0–34.0)
MCHC: 32.7 g/dL (ref 30.0–36.0)
MCV: 93.9 fL (ref 80.0–100.0)
Platelets: 181 10*3/uL (ref 150–400)
RBC: 4.59 MIL/uL (ref 4.22–5.81)
RDW: 13.5 % (ref 11.5–15.5)
WBC: 5.7 10*3/uL (ref 4.0–10.5)
nRBC: 0 % (ref 0.0–0.2)

## 2022-01-16 LAB — BASIC METABOLIC PANEL
Anion gap: 8 (ref 5–15)
BUN: 28 mg/dL — ABNORMAL HIGH (ref 8–23)
CO2: 27 mmol/L (ref 22–32)
Calcium: 9.3 mg/dL (ref 8.9–10.3)
Chloride: 105 mmol/L (ref 98–111)
Creatinine, Ser: 1.88 mg/dL — ABNORMAL HIGH (ref 0.61–1.24)
GFR, Estimated: 38 mL/min — ABNORMAL LOW (ref >60)
Glucose, Bld: 100 mg/dL — ABNORMAL HIGH (ref 70–99)
Potassium: 4.1 mmol/L (ref 3.5–5.1)
Sodium: 140 mmol/L (ref 135–145)

## 2022-01-16 LAB — TROPONIN I (HIGH SENSITIVITY): Troponin I (High Sensitivity): 9 ng/L (ref <18)

## 2022-01-16 LAB — CBG MONITORING, ED: Glucose-Capillary: 116 mg/dL — ABNORMAL HIGH (ref 70–99)

## 2022-01-16 NOTE — ED Notes (Signed)
Pt takes BP meds at night hasnt taken them tonight.

## 2022-01-16 NOTE — ED Notes (Signed)
Pt discussed with EDP Archie Balboa - no CT at this time.

## 2022-01-16 NOTE — ED Triage Notes (Addendum)
Pt presents via POV with complaints of left arm weakness/tingling that occurred while driving and holding his cell phone in his arm. He states that he's had these feelings of weakness intermittently over the last several days with his left arm feeling cold but not cool to touch. He notes tonight once he switched arms the sensation went away lasting 1-2 mins. NIHSS 0 - pt asymptomatic at this time. Denies hx of strokes, falls, injury, or neuropathy.    CBG 116

## 2022-01-17 ENCOUNTER — Observation Stay
Admission: EM | Admit: 2022-01-17 | Discharge: 2022-01-18 | Disposition: A | Discharge status: Home / Self Care | Payer: Medicare HMO | Attending: Internal Medicine | Admitting: Internal Medicine

## 2022-01-17 ENCOUNTER — Observation Stay: Payer: Medicare HMO

## 2022-01-17 ENCOUNTER — Emergency Department: Payer: Medicare HMO

## 2022-01-17 ENCOUNTER — Observation Stay
Admit: 2022-01-17 | Discharge: 2022-01-17 | Disposition: A | Discharge status: Home / Self Care | Payer: Medicare HMO | Attending: Family Medicine | Admitting: Family Medicine

## 2022-01-17 ENCOUNTER — Other Ambulatory Visit: Payer: Self-pay

## 2022-01-17 DIAGNOSIS — G450 Vertebro-basilar artery syndrome: Secondary | ICD-10-CM

## 2022-01-17 DIAGNOSIS — N138 Other obstructive and reflux uropathy: Secondary | ICD-10-CM

## 2022-01-17 DIAGNOSIS — R531 Weakness: Secondary | ICD-10-CM | POA: Diagnosis not present

## 2022-01-17 DIAGNOSIS — G459 Transient cerebral ischemic attack, unspecified: Secondary | ICD-10-CM | POA: Diagnosis not present

## 2022-01-17 DIAGNOSIS — R200 Anesthesia of skin: Secondary | ICD-10-CM

## 2022-01-17 DIAGNOSIS — I10 Essential (primary) hypertension: Secondary | ICD-10-CM | POA: Diagnosis present

## 2022-01-17 DIAGNOSIS — N400 Enlarged prostate without lower urinary tract symptoms: Secondary | ICD-10-CM | POA: Diagnosis not present

## 2022-01-17 DIAGNOSIS — I6523 Occlusion and stenosis of bilateral carotid arteries: Secondary | ICD-10-CM | POA: Diagnosis not present

## 2022-01-17 DIAGNOSIS — R202 Paresthesia of skin: Secondary | ICD-10-CM | POA: Diagnosis not present

## 2022-01-17 DIAGNOSIS — I6612 Occlusion and stenosis of left anterior cerebral artery: Secondary | ICD-10-CM | POA: Diagnosis not present

## 2022-01-17 DIAGNOSIS — R2990 Unspecified symptoms and signs involving the nervous system: Secondary | ICD-10-CM

## 2022-01-17 DIAGNOSIS — I6501 Occlusion and stenosis of right vertebral artery: Secondary | ICD-10-CM | POA: Diagnosis not present

## 2022-01-17 DIAGNOSIS — N183 Chronic kidney disease, stage 3 unspecified: Secondary | ICD-10-CM

## 2022-01-17 LAB — CBC
HCT: 46.8 % (ref 39.0–52.0)
Hemoglobin: 14.9 g/dL (ref 13.0–17.0)
MCH: 30.1 pg (ref 26.0–34.0)
MCHC: 31.8 g/dL (ref 30.0–36.0)
MCV: 94.5 fL (ref 80.0–100.0)
Platelets: 190 10*3/uL (ref 150–400)
RBC: 4.95 MIL/uL (ref 4.22–5.81)
RDW: 13.5 % (ref 11.5–15.5)
WBC: 6.1 10*3/uL (ref 4.0–10.5)
nRBC: 0 % (ref 0.0–0.2)

## 2022-01-17 LAB — URINALYSIS, ROUTINE W REFLEX MICROSCOPIC
Bacteria, UA: NONE SEEN
Bilirubin Urine: NEGATIVE
Glucose, UA: NEGATIVE mg/dL
Hgb urine dipstick: NEGATIVE
Ketones, ur: NEGATIVE mg/dL
Nitrite: NEGATIVE
Protein, ur: NEGATIVE mg/dL
Specific Gravity, Urine: 1.008 (ref 1.005–1.030)
Squamous Epithelial / HPF: NONE SEEN (ref 0–5)
pH: 5 (ref 5.0–8.0)

## 2022-01-17 LAB — HIV ANTIBODY (ROUTINE TESTING W REFLEX): HIV Screen 4th Generation wRfx: NONREACTIVE

## 2022-01-17 LAB — BASIC METABOLIC PANEL
Anion gap: 8 (ref 5–15)
BUN: 28 mg/dL — ABNORMAL HIGH (ref 8–23)
CO2: 26 mmol/L (ref 22–32)
Calcium: 9.2 mg/dL (ref 8.9–10.3)
Chloride: 105 mmol/L (ref 98–111)
Creatinine, Ser: 1.62 mg/dL — ABNORMAL HIGH (ref 0.61–1.24)
GFR, Estimated: 45 mL/min — ABNORMAL LOW (ref >60)
Glucose, Bld: 114 mg/dL — ABNORMAL HIGH (ref 70–99)
Potassium: 4.2 mmol/L (ref 3.5–5.1)
Sodium: 139 mmol/L (ref 135–145)

## 2022-01-17 LAB — LIPID PANEL
Cholesterol: 190 mg/dL (ref 0–200)
HDL: 50 mg/dL (ref >40)
LDL Cholesterol: 110 mg/dL — ABNORMAL HIGH (ref 0–99)
Total CHOL/HDL Ratio: 3.8 RATIO
Triglycerides: 148 mg/dL (ref <150)
VLDL: 30 mg/dL (ref 0–40)

## 2022-01-17 LAB — ECHOCARDIOGRAM LIMITED BUBBLE STUDY: S' Lateral: 2.5 cm

## 2022-01-17 LAB — HEMOGLOBIN A1C
Hgb A1c MFr Bld: 5.9 % — ABNORMAL HIGH (ref 4.8–5.6)
Mean Plasma Glucose: 122.63 mg/dL

## 2022-01-17 LAB — TROPONIN I (HIGH SENSITIVITY): Troponin I (High Sensitivity): 9 ng/L (ref <18)

## 2022-01-17 MED ORDER — FLAXSEED OIL 1000 MG PO CAPS: 1.0000 | ORAL_CAPSULE | Freq: Every day | ORAL | Status: DC

## 2022-01-17 MED ORDER — ACETAMINOPHEN 325 MG PO TABS: 650.0000 mg | ORAL_TABLET | Freq: Four times a day (QID) | ORAL | Status: DC | PRN

## 2022-01-17 MED ORDER — ONDANSETRON HCL 4 MG/2ML IJ SOLN
4.0000 mg | Freq: Four times a day (QID) | INTRAMUSCULAR | Status: DC | PRN
Start: 2022-01-17 — End: 2022-01-18

## 2022-01-17 MED ORDER — TAMSULOSIN HCL 0.4 MG PO CAPS
0.4000 mg | ORAL_CAPSULE | Freq: Every day | ORAL | Status: DC
  Administered 2022-01-17 – 2022-01-18 (×2): 0.4 mg via ORAL
  Filled 2022-01-17 (×2): qty 1

## 2022-01-17 MED ORDER — IRBESARTAN 150 MG PO TABS
150.0000 mg | ORAL_TABLET | Freq: Every day | ORAL | Status: DC
  Administered 2022-01-17 – 2022-01-18 (×2): 150 mg via ORAL
  Filled 2022-01-17 (×2): qty 1

## 2022-01-17 MED ORDER — ONDANSETRON HCL 4 MG PO TABS: 4.0000 mg | ORAL_TABLET | Freq: Four times a day (QID) | ORAL | Status: DC | PRN

## 2022-01-17 MED ORDER — STROKE: EARLY STAGES OF RECOVERY BOOK: Freq: Once | Status: DC

## 2022-01-17 MED ORDER — ASPIRIN 81 MG PO CHEW
324.0000 mg | CHEWABLE_TABLET | Freq: Once | ORAL | Status: AC
  Administered 2022-01-17: 324 mg via ORAL
  Filled 2022-01-17: qty 4

## 2022-01-17 MED ORDER — VITAMIN D (ERGOCALCIFEROL) 1.25 MG (50000 UNIT) PO CAPS: 50000.0000 [IU] | ORAL_CAPSULE | ORAL | Status: DC

## 2022-01-17 MED ORDER — ATORVASTATIN CALCIUM 20 MG PO TABS
20.0000 mg | ORAL_TABLET | Freq: Every day | ORAL | Status: DC
  Administered 2022-01-17 – 2022-01-18 (×2): 20 mg via ORAL
  Filled 2022-01-17 (×2): qty 1

## 2022-01-17 MED ORDER — ASCORBIC ACID 500 MG PO TABS
500.0000 mg | ORAL_TABLET | Freq: Every day | ORAL | Status: DC
Start: 2022-01-17 — End: 2022-01-18
  Administered 2022-01-17 – 2022-01-18 (×2): 500 mg via ORAL
  Filled 2022-01-17 (×2): qty 1

## 2022-01-17 MED ORDER — TRAZODONE HCL 50 MG PO TABS: 25.0000 mg | ORAL_TABLET | Freq: Every evening | ORAL | Status: DC | PRN

## 2022-01-17 MED ORDER — MAGNESIUM HYDROXIDE 400 MG/5ML PO SUSP: 30.0000 mL | Freq: Every day | ORAL | Status: DC | PRN

## 2022-01-17 MED ORDER — CLOPIDOGREL BISULFATE 75 MG PO TABS
75.0000 mg | ORAL_TABLET | Freq: Every day | ORAL | Status: DC
Start: 2022-01-17 — End: 2022-01-18
  Administered 2022-01-17 – 2022-01-18 (×2): 75 mg via ORAL
  Filled 2022-01-17 (×2): qty 1

## 2022-01-17 MED ORDER — LORATADINE 10 MG PO TABS
10.0000 mg | ORAL_TABLET | Freq: Every day | ORAL | Status: DC
  Administered 2022-01-17 – 2022-01-18 (×2): 10 mg via ORAL
  Filled 2022-01-17 (×2): qty 1

## 2022-01-17 MED ORDER — SODIUM CHLORIDE 0.9 % IV SOLN: INTRAVENOUS | Status: DC

## 2022-01-17 MED ORDER — ACETAMINOPHEN 650 MG RE SUPP
650.0000 mg | Freq: Four times a day (QID) | RECTAL | Status: DC | PRN
Start: 2022-01-17 — End: 2022-01-18

## 2022-01-17 MED ORDER — ENOXAPARIN SODIUM 40 MG/0.4ML IJ SOSY
40.0000 mg | PREFILLED_SYRINGE | INTRAMUSCULAR | Status: DC
  Administered 2022-01-17 – 2022-01-18 (×2): 40 mg via SUBCUTANEOUS
  Filled 2022-01-17 (×2): qty 0.4

## 2022-01-17 MED ORDER — ASPIRIN 81 MG PO CHEW
81.0000 mg | CHEWABLE_TABLET | Freq: Every day | ORAL | Status: DC
  Administered 2022-01-18: 81 mg via ORAL
  Filled 2022-01-17: qty 1

## 2022-01-17 NOTE — Evaluation (Signed)
Physical Therapy Evaluation Patient Details Name: MARIBEL LUIS MRN: [id number] DOB: [birth year] Today's Date: 01/17/2022  History of Present Illness  Patient is a 71 year old male with presented to [facility] ED on 01/16/22 due to stroke like symptoms. Patient has a PMH (+) for hypertension, hyperlipidemia not on medication, and CKD.   Clinical Impression  Physical Therapy Evaluation completed this date. Patient tolerated session well and was agreeable to treatment. Upon entry into room patient was supine in bed with no pain reported. Patient states he lives alone in a split level house and 1STE. Prior to hospitalization patient was Independent with all ADLs and mobility. Patient demonstrated all gait, bed mobility, and functional transfers independently with no balance concerns. Patient does not require additional skilled physical therapy at this time as he is at/near his baseline. Signing off.      Recommendations for follow up therapy are one component of a multi-disciplinary discharge planning process, led by the attending physician.  Recommendations may be updated based on patient status, additional functional criteria and insurance authorization.  Follow Up Recommendations No PT follow up    Assistance Recommended at Discharge None  Patient can return home with the following       Equipment Recommendations None recommended by PT  Recommendations for Other Services       Functional Status Assessment Patient has had a recent decline in their functional status and demonstrates the ability to make significant improvements in function in a reasonable and predictable amount of time.     Precautions / Restrictions Precautions Precautions: Fall Restrictions Weight Bearing Restrictions: No      Mobility  Bed Mobility Overal bed mobility: Independent                  Transfers Overall transfer level: Modified independent Equipment used: None                     Ambulation/Gait Ambulation/Gait assistance: Independent Gait Distance (Feet): 400 Feet Assistive device: None Gait Pattern/deviations: WFL(Within Functional Limits)          Stairs            Wheelchair Mobility    Modified Rankin (Stroke Patients Only)       Balance Overall balance assessment: Modified Independent                                           Pertinent Vitals/Pain Pain Assessment Pain Assessment: No/denies pain    Home Living Family/patient expects to be discharged to:: Private residence Living Arrangements: Alone Available Help at Discharge: Family;Available PRN/intermittently;Friend(s) Type of Home: House Home Access: Stairs to enter   [employer] of Steps: 1   Home Layout: One level (1.5 levels) Home Equipment: Grab bars - tub/shower      Prior Function Prior Level of Function : Independent/Modified Independent                     Hand Dominance   Dominant Hand: Right    Extremity/Trunk Assessment   Upper Extremity Assessment Upper Extremity Assessment: Overall WFL for tasks assessed    Lower Extremity Assessment Lower Extremity Assessment: Overall WFL for tasks assessed    Cervical / Trunk Assessment Cervical / Trunk Assessment: Normal  Communication   Communication: No difficulties  Cognition Arousal/Alertness: Awake/alert Behavior During Therapy: WFL for tasks assessed/performed Overall  Cognitive Status: Within Functional Limits for tasks assessed                                 General Comments: A&Ox3 self location and situation        General Comments General comments (skin integrity, edema, etc.): Patient able to perform all tolietting and Pericare Independently    Exercises Other Exercises Other Exercises: Patient educated on role of PT in acute care setting, fall risk, and d/c recommendations   Assessment/Plan    PT Assessment Patient does not need any  further PT services  PT Problem List Impaired sensation;Decreased strength       PT Treatment Interventions      PT Goals (Current goals can be found in the Care Plan section)  Acute Rehab PT Goals Patient Stated Goal: to go home PT Goal Formulation: With patient Time For Goal Achievement: 01/17/22 Potential to Achieve Goals: Good    Frequency       Co-evaluation               AM-PAC PT "6 Clicks" Mobility  Outcome Measure Help needed turning from your back to your side while in a flat bed without using bedrails?: None Help needed moving from lying on your back to sitting on the side of a flat bed without using bedrails?: None Help needed moving to and from a bed to a chair (including a wheelchair)?: None Help needed standing up from a chair using your arms (e.g., wheelchair or bedside chair)?: None Help needed to walk in hospital room?: None Help needed climbing 3-5 steps with a railing? : None 6 Click Score: 24    End of Session Equipment Utilized During Treatment: Gait belt Activity Tolerance: Patient tolerated treatment well Patient left: in bed;with call bell/phone within reach Nurse Communication: Mobility status PT Visit Diagnosis: Other symptoms and signs involving the nervous system (R29.898)    Time: 2637-8588 PT Time Calculation (min) (ACUTE ONLY): 15 min   Charges:   PT Evaluation $PT Eval Low Complexity: 1 Low          Iva Boop, PT  01/17/22. 11:27 AM

## 2022-01-17 NOTE — ED Notes (Signed)
Dr. Mansy at bedside. 

## 2022-01-17 NOTE — Progress Notes (Signed)
OT Cancellation Note  Patient Details Name: Frederick Nelson MRN: [id number] DOB: 12/06/1950   Cancelled Treatment:    Reason Eval/Treat Not Completed: OT screened, no needs identified, will sign off. Order received, chart reviewed. Pt back to baseline functional independence. No skilled OT needs identified. Will sign off. Please re-consult if additional needs arise.   Dessie Coma, M.S. OTR/L  01/17/22, 12:08 PM  ascom [phone]

## 2022-01-17 NOTE — Consult Note (Signed)
NEUROLOGY CONSULTATION NOTE   Date of service: Jan 17, 2022 Patient Name: Frederick Nelson MRN:  [id number] DOB:  [birth year] Reason for consult: TIA Requesting physician: Dr. Lorella Nimrod _ _ _   _ __   _ __ _ _  __ __   _ __   __ _  History of Present Illness   71 yo gentleman with CKD stage III, HTN, HL who presented after recurrent episodes of L sided numbness and weakness. He is currently asymptomatic. He had 3 events yesterday, the first involving isolated L face numbness lasting 5 minutes then resolving. He had 2 subsequent episodes of numbness and weakness in L hand, each lasting <5 min then resolving. MRI brain showed no e/o acute infarct (personal review). NIHSS currently 0. He is admitted for TIA workup.   ROS   Per HPI: all other systems reviewed and are negative  Past History   I have reviewed the following:  Past Medical History:  Diagnosis Date   Abdominal pain, right lower quadrant    Allergy    Arthritis    Benign hypertension with chronic kidney disease, stage III (HCC)    Chronic low back pain    CKD (chronic kidney disease), stage III (HCC)    Degenerative joint disease (DJD) of hip    Genital herpes    Takes Valtrex for prevention   GERD (gastroesophageal reflux disease) 1995   Hyperlipidemia    Hypertension    Malignant neoplasm of ascending colon ([place]) 2011   Special screening for malignant neoplasms, colon 2011   Past Surgical History:  Procedure Laterality Date   COLON SURGERY  06/26/2007   right hemicolectomy-T2N0 carcinoma of right colon. The tumor was 2.6 cm,low grade with zero of 14 nodes positive. No evidence of venous or lymphatic invasion   COLONOSCOPY  [id number]   COLONOSCOPY WITH PROPOFOL N/A 08/04/2015   Procedure: COLONOSCOPY WITH PROPOFOL;  Surgeon: [name], MD;  [facility] END[facility]COPY;  Service: Endoscopy;  Laterality: N/A;   COLONOSCOPY WITH PROPOFOL N/A 09/01/2020   Procedure: COLONOSCOPY WITH PROPOFOL;  Surgeon: [name], MD;  Location: [facility] ENDOSCOPY;  Service: Endoscopy;  Laterality: N/A;   Family History  Problem Relation Age of Onset   Cancer Brother        cancer of the thymus   Diabetes Mother    Hypertension Father    Stroke Father    Cancer Maternal Aunt        breast cancer   Social History   Socioeconomic History   Marital status: Divorced    Spouse name: Not on file   Number of children: 1   Years of education: 12   Highest education level: Some college, no degree  Occupational History   Not on file  Tobacco Use   Smoking status: Never   Smokeless tobacco: Never  Vaping Use   Vaping Use: Never used  Substance and Sexual Activity   Alcohol use: Yes    Alcohol/week: 1.0 - 2.0 standard drink    Types: 1 - 2 Standard drinks or equivalent per week    Comment: Socially only - Very little   Drug use: No   Sexual activity: Yes    Birth control/protection: None  Other Topics Concern   Not on file  Social History Narrative   Pt lives alone; lives in [place]; worked in Scientist, product/process development; retd [employer]. Non-smokers; Hx of marijuana smoking- when young; rare alcohol.    Social Determinants of  Health   Financial Resource Strain: Low Risk    Difficulty of Paying Living Expenses: Not hard at all  Food Insecurity: No Food Insecurity   Worried About [place] in the Last Year: Never true   Ran Out of Food in the Last Year: Never true  Transportation Needs: No Transportation Needs   Lack of Transportation (Medical): No   Lack of Transportation (Non-Medical): No  Physical Activity: Insufficiently Active   Days of Exercise per Week: 2 days   Minutes of Exercise per Session: 20 min  Stress: No Stress Concern Present   Feeling of Stress : Not at all  Social Connections: Moderately Isolated   Frequency of Communication with Friends and Family: More than three times a week   Frequency of Social Gatherings with Friends and Family: Twice a week   Attends Religious  Services: Never   Marine scientist or Organizations: Yes   Attends Music therapist: More than 4 times per year   Marital Status: Divorced   Allergies  Allergen Reactions   Ace Inhibitors Swelling   Ciprofloxacin     Tendon damage    Medications   Medications Prior to Admission  Medication Sig Dispense Refill Last Dose   AMBULATORY NON FORMULARY MEDICATION Prostaglandin 10  Test Dose  30m vial   Qty #3 Refills 0  [id number] [phone] mL 0 unk at unk   Ascorbic Acid (VITA-C PO) Take 1 tablet by mouth daily.   Past Week at 2200   Flaxseed, Linseed, (FLAXSEED OIL) 1000 MG CAPS Take 1 capsule by mouth daily.   Past Week at 2200   loratadine (CLARITIN) 10 MG tablet TAKE 1 TABLET BY MOUTH EVERY DAY 90 tablet 1 Past Week   sildenafil (VIAGRA) 100 MG tablet Take 0.5-1 tablets (50-100 mg total) by mouth daily as needed for erectile dysfunction. 30 tablet 0 prn at prn   tamsulosin (FLOMAX) 0.4 MG CAPS capsule TAKE 1 CAPSULE BY MOUTH EVERY DAY 30 capsule 2 Past Week at 2200   valACYclovir (VALTREX) 1000 MG tablet Take 1 tablet (1,000 mg total) by mouth daily. 90 tablet 3 Past Week at 2200   valsartan (DIOVAN) 160 MG tablet Take 1 tablet (160 mg total) by mouth daily. 90 tablet 1 Past Week at 2200   Vitamin D, Ergocalciferol, (DRISDOL) 1.25 MG (50000 UNIT) CAPS capsule Take 1 capsule (50,000 Units total) by mouth every 7 (seven) days. 12 capsule 1 Past Week      Current Facility-Administered Medications:     stroke: early stages of recovery book, , Does not apply, Once, Mansy, Jan A, MD   0.9 %  sodium chloride infusion, , Intravenous, Continuous, Mansy, Jan A, MD, Last Rate: 100 mL/hr at 01/17/22 1716, New Bag at 01/17/22 1716   acetaminophen (TYLENOL) tablet 650 mg, 650 mg, Oral, Q6H PRN **OR** acetaminophen (TYLENOL) suppository 650 mg, 650 mg, Rectal, Q6H PRN, Mansy, Jan A, MD   ascorbic acid (VITAMIN C) tablet 500 mg, 500 mg, Oral,  Daily, Mansy, Jan A, MD, 500 mg at 01/17/22 1005   [START ON 01/18/2022] aspirin chewable tablet 81 mg, 81 mg, Oral, Daily, SSu MonksM, MD   atorvastatin (LIPITOR) tablet 20 mg, 20 mg, Oral, Daily, Mansy, Jan A, MD, 20 mg at 01/17/22 1006   clopidogrel (PLAVIX) tablet 75 mg, 75 mg, Oral, Daily, Mansy, Jan A, MD, 75 mg at 01/17/22 1005   enoxaparin (LOVENOX) injection 40 mg, 40 mg,  Subcutaneous, Q24H, Mansy, Jan A, MD, 40 mg at 01/17/22 1005   irbesartan (AVAPRO) tablet 150 mg, 150 mg, Oral, Daily, Mansy, Jan A, MD, 150 mg at 01/17/22 1005   loratadine (CLARITIN) tablet 10 mg, 10 mg, Oral, Daily, Mansy, Jan A, MD, 10 mg at 01/17/22 1006   magnesium hydroxide (MILK OF MAGNESIA) suspension 30 mL, 30 mL, Oral, Daily PRN, Mansy, Jan A, MD   ondansetron (ZOFRAN) tablet 4 mg, 4 mg, Oral, Q6H PRN **OR** ondansetron (ZOFRAN) injection 4 mg, 4 mg, Intravenous, Q6H PRN, Mansy, Jan A, MD   tamsulosin [facility]) capsule 0.4 mg, 0.4 mg, Oral, Daily, Mansy, Jan A, MD, 0.4 mg at 01/17/22 1006   traZODone (DESYREL) tablet 25 mg, 25 mg, Oral, QHS PRN, Mansy, Jan A, MD   [START ON 01/19/2022] Vitamin D (Ergocalciferol) (DRISDOL) capsule 50,000 Units, 50,000 Units, Oral, Q7 days, Mansy, Jan A, MD  Vitals   Vitals:   01/17/22 0600 01/17/22 0800 01/17/22 1100 01/17/22 1212  BP: (!) 185/87 (!) 180/92 (!) 174/80 (!) 178/84  Pulse: (!) 55 75 82 (!) 56  Resp:   18 19  Temp:   98.4 F (36.9 C) (!) 97.5 F (36.4 C)  TempSrc:   Oral   SpO2: 100% 100% 96% 100%  Weight:      Height:         Body mass index is 28.87 kg/m.  Physical Exam   Physical Exam Gen: A&O x4, NAD HEENT: Atraumatic, normocephalic;mucous membranes moist; oropharynx clear, tongue without atrophy or fasciculations. Neck: Supple, trachea midline. Resp: CTAB, no w/r/r CV: RRR, no m/g/r; nml S1 and S2. 2+ symmetric peripheral pulses. Abd: soft/NT/ND; nabs x 4 quad Extrem: Nml bulk; no cyanosis, clubbing, or edema.  Neuro: *MS: A&O x4.  Follows multi-step commands.  *Speech: fluid, nondysarthric, able to name and repeat *CN:    I: Deferred   II,III: PERRLA, VFF by confrontation, optic discs unable to be visualized 2/2 pupillary constriction   III,IV,VI: EOMI w/o nystagmus, no ptosis   V: Sensation intact from V1 to V3 to LT   VII: Eyelid closure was full.  Smile symmetric.   VIII: Hearing intact to voice   IX,X: Voice normal, palate elevates symmetrically    XI: SCM/trap 5/5 bilat   XII: Tongue protrudes midline, no atrophy or fasciculations   *Motor:   Normal bulk.  No tremor, rigidity or bradykinesia. No pronator drift.    Strength: Dlt Bic Tri WrE WrF FgS Gr HF KnF KnE PlF DoF    Left '5 5 5 5 5 5 5 5 5 5 5 5    '$ Right '5 5 5 5 5 5 5 5 5 5 5 5    '$ *Sensory: Intact to light touch, pinprick, temperature vibration throughout. Symmetric. Propioception intact bilat.  No double-simultaneous extinction.  *Coordination:  Finger-to-nose, heel-to-shin, rapid alternating motions were intact. *Reflexes:  2+ and symmetric throughout without clonus; toes down-going bilat *Gait: deferred  NIHSS = 0   Premorbid mRS = 0   Labs   CBC:  Recent Labs  Lab 01/16/22 2057 01/17/22 0310  WBC 5.7 6.1  HGB 14.1 14.9  HCT 43.1 46.8  MCV 93.9 94.5  PLT 181 222    Basic Metabolic Panel:  Lab Results  Component Value Date   NA 139 01/17/2022   K 4.2 01/17/2022   CO2 26 01/17/2022   GLUCOSE 114 (H) 01/17/2022   BUN 28 (H) 01/17/2022   CREATININE 1.62 (H) 01/17/2022   CALCIUM 9.2  01/17/2022   GFRNONAA 45 (L) 01/17/2022   GFRAA 49 (L) 10/15/2020   Lipid Panel:  Lab Results  Component Value Date   LDLCALC 110 (H) 01/17/2022   HgbA1c:  Lab Results  Component Value Date   HGBA1C 5.9 (H) 01/17/2022   Urine Drug Screen: No results found for: LABOPIA, COCAINSCRNUR, LABBENZ, AMPHETMU, THCU, LABBARB  Alcohol Level No results found for: ETH   Impression   71 yo gentleman with CKD stage III, HTN, HL who presented after  recurrent episodes of L sided numbness and weakness c/f crescendo TIAs. Currently asymptomatic.  Recommendations   - Goal normotension; avoid hypotension - MRA H&N - TTE w/ bubble - Check A1c and LDL + add statin per guidelines - ASA '81mg'$  daily + plavix '75mg'$  daily x21 days f/b ASA '81mg'$  daily monotherapy after that - q4 hr neuro checks - STAT head CT for any change in neuro exam - Tele - PT/OT/SLP - Stroke education - Amb referral to neurology upon discharge   Neurology will continue to follow ______________________________________________________________________   Thank you for the opportunity to take part in the care of this patient. If you have any further questions, please contact the neurology consultation attending.  Signed,  Su Monks, MD [facility] [phone]  If 7pm- 7am, please page neurology on call as listed in [place].

## 2022-01-17 NOTE — Assessment & Plan Note (Signed)
-   We will continue Flomax 

## 2022-01-17 NOTE — H&P (Signed)
[place]   PATIENT NAME: Frederick Nelson    MR#:  [id number]  DATE OF BIRTH:  01-02-1951  DATE OF ADMISSION:  01/17/2022  PRIMARY CARE PHYSICIAN: Steele Sizer, MD   Patient is coming from: Home  REQUESTING/REFERRING PHYSICIAN: Ward, Delice Bison, DO  CHIEF COMPLAINT:   Chief Complaint  Patient presents with   Extremity Weakness    HISTORY OF PRESENT ILLNESS:  Frederick Nelson is a 71 y.o. African-American male with medical history significant for stage IIIa chronic kidney disease, GERD, hypertension, dyslipidemia and osteoarthritis, who presented to the emergency room with acute onset of left-sided numbness and weakness that started yesterday.  It affected his left side of the lower face earlier in the afternoon that lasted 3 minutes and as he was driving his car in the evening he experienced another episode of left hand numbness while in the waiting room also lasted few minutes and later resolved.Marland Kitchen  He denied any headache or dizziness or blurred vision.  No other paresthesias or focal muscle weakness.  No tinnitus or vertigo.  No chest pain or palpitations.  No dyspnea or cough.  ED course: Upon presentation to the ER, BP was 163/73 and later 182/76 with otherwise normal vital signs.  Labs revealed a BUN of 28 and creatinine 1.88 slightly above previous levels with CKD 3a.  CBC was within normal.  EKG as reviewed by me : EKG showed normal sinus rhythm with a rate of 60 with Q waves inferiorly. Imaging: Noncontrasted CT scan revealed left periventricular lacunar infarct of indeterminate age with no significant mass effect or midline shift and no acute intracranial hemorrhage.  2 view chest x-ray showed no acute cardiopulmonary disease.  The patient was given 4 baby aspirin.  He will be admitted to an observation telemetry bed for further evaluation and management. PAST MEDICAL HISTORY:   Past Medical History:  Diagnosis Date   Abdominal pain, right lower quadrant     Allergy    Arthritis    Benign hypertension with chronic kidney disease, stage III (HCC)    Chronic low back pain    CKD (chronic kidney disease), stage III (HCC)    Degenerative joint disease (DJD) of hip    Genital herpes    Takes Valtrex for prevention   GERD (gastroesophageal reflux disease) 1995   Hyperlipidemia    Hypertension    Malignant neoplasm of ascending colon ([place]) 2011   Special screening for malignant neoplasms, colon 2011    PAST SURGICAL HISTORY:   Past Surgical History:  Procedure Laterality Date   COLON SURGERY  06/26/2007   right hemicolectomy-T2N0 carcinoma of right colon. The tumor was 2.6 cm,low grade with zero of 14 nodes positive. No evidence of venous or lymphatic invasion   COLONOSCOPY  [id number]   COLONOSCOPY WITH PROPOFOL N/A 08/04/2015   Procedure: COLONOSCOPY WITH PROPOFOL;  Surgeon: [name], MD;  Location: [facility] ENDOSCOPY;  Service: Endoscopy;  Laterality: N/A;   COLONOSCOPY WITH PROPOFOL N/A 09/01/2020   Procedure: COLONOSCOPY WITH PROPOFOL;  Surgeon: [name], MD;  Location: [facility] ENDOSCOPY;  Service: Endoscopy;  Laterality: N/A;    SOCIAL HISTORY:   Social History   Tobacco Use   Smoking status: Never   Smokeless tobacco: Never  Substance Use Topics   Alcohol use: Yes    Alcohol/week: 1.0 - 2.0 standard drink    Types: 1 - 2 Standard drinks or equivalent per week    Comment: Socially only - Very  little    FAMILY HISTORY:   Family History  Problem Relation Age of Onset   Cancer Brother        cancer of the thymus   Diabetes Mother    Hypertension Father    Stroke Father    Cancer Maternal Aunt        breast cancer    DRUG ALLERGIES:   Allergies  Allergen Reactions   Ace Inhibitors Swelling   Ciprofloxacin     Tendon damage    REVIEW OF SYSTEMS:   ROS As per history of present illness. All pertinent systems were reviewed above. Constitutional, HEENT, cardiovascular, respiratory, GI, GU,  musculoskeletal, neuro, psychiatric, endocrine, integumentary and hematologic systems were reviewed and are otherwise negative/unremarkable except for positive findings mentioned above in the HPI.   MEDICATIONS AT HOME:   Prior to Admission medications   Medication Sig Start Date End Date Taking? Authorizing Provider  tamsulosin (FLOMAX) 0.4 MG CAPS capsule TAKE 1 CAPSULE BY MOUTH EVERY DAY 11/23/21   Ernestine Conrad, Hunt Oris, PA-C  AMBULATORY NON FORMULARY MEDICATION Prostaglandin 10  Test Dose  9m vial   [place]ty [place]3 [phone] [phone]/24/23   SAbbie Sons MD  Ascorbic Acid (VITA-C PO) Take 1 tablet by mouth daily.    [provider]  febuxostat (ULORIC) 40 MG tablet Take 1 tablet (40 mg total) by mouth daily. Patient not taking: Reported on 11/22/2021 10/13/21   SSteele Sizer MD  Flaxseed, Linseed, (FLAXSEED OIL) 1000 MG CAPS Take by mouth.    [provider]  loratadine (CLARITIN) 10 MG tablet TAKE 1 TABLET BY MOUTH EVERY DAY 07/07/21   SAncil Boozer KDrue Stager MD  predniSONE (DELTASONE) 10 MG tablet Take 1 tablet (10 mg total) by mouth daily with breakfast. 10/13/21   SSteele Sizer MD  sildenafil (VIAGRA) 100 MG tablet Take 0.5-1 tablets (50-100 mg total) by mouth daily as needed for erectile dysfunction. 04/15/21   SSteele Sizer MD  valACYclovir (VALTREX) 1000 MG tablet Take 1 tablet (1,000 mg total) by mouth daily. 10/13/21   SSteele Sizer MD  valsartan (DIOVAN) 160 MG tablet Take 1 tablet (160 mg total) by mouth daily. 10/13/21   SSteele Sizer MD  Vitamin D, Ergocalciferol, (DRISDOL) 1.25 MG (50000 UNIT) CAPS capsule Take 1 capsule (50,000 Units total) by mouth every 7 (seven) days. 10/13/21   SSteele Sizer MD      VITAL SIGNS:  Blood pressure (!) 175/79, pulse (!) 55, temperature 98 F (36.7 C), temperature source Oral, resp. rate 16, height '5\' 11"'$  (1.803 m), weight 93.9 kg, SpO2 100 %.  PHYSICAL EXAMINATION:   Physical Exam  GENERAL:  71y.o.-year-old African-American male patient lying in the bed with no acute distress.  EYES: Pupils equal, round, reactive to light and accommodation. No scleral icterus. Extraocular muscles intact.  HEENT: Head atraumatic, normocephalic. Oropharynx and nasopharynx clear.  NECK:  Supple, no jugular venous distention. No thyroid enlargement, no tenderness.  LUNGS: Normal breath sounds bilaterally, no wheezing, rales,rhonchi or crepitation. No use of accessory muscles of respiration.  CARDIOVASCULAR: Regular rate and rhythm, S1, S2 normal. No murmurs, rubs, or gallops.  ABDOMEN: Soft, nondistended, nontender. Bowel sounds present. No organomegaly or mass.  EXTREMITIES: No pedal edema, cyanosis, or clubbing.  NEUROLOGIC: Cranial nerves II through XII are intact. Muscle strength 5/5 in all extremities. Sensation intact. Gait not checked.  PSYCHIATRIC: The patient is alert and oriented x 3.  Normal affect and good eye contact. SKIN:  No obvious rash, lesion, or ulcer.   LABORATORY PANEL:   CBC Recent Labs  Lab 01/17/22 0310  WBC 6.1  HGB 14.9  HCT 46.8  PLT 190   ------------------------------------------------------------------------------------------------------------------  Chemistries  Recent Labs  Lab 01/16/22 2057  NA 140  K 4.1  CL 105  CO2 27  GLUCOSE 100*  BUN 28*  CREATININE 1.88*  CALCIUM 9.3   ------------------------------------------------------------------------------------------------------------------  Cardiac Enzymes No results for input(s): TROPONINI in the last 168 hours. ------------------------------------------------------------------------------------------------------------------  RADIOLOGY:  DG Chest 2 View  Result Date: 01/16/2022 CLINICAL DATA:  Left arm weakness and tingling. EXAM: CHEST - 2 VIEW COMPARISON:  None Available. FINDINGS: Shallow inspiration. Normal heart size and pulmonary vascularity. No focal airspace  disease or consolidation in the lungs. No blunting of costophrenic angles. No pneumothorax. Mediastinal contours appear intact. IMPRESSION: No active cardiopulmonary disease. Electronically Signed   By: Lucienne Capers M.D.   On: 01/16/2022 21:22   CT HEAD WO CONTRAST (5MM)  Result Date: 01/16/2022 CLINICAL DATA:  Neurological deficit, acute, stroke suspected. Left arm weakness and tingling over the last several days. EXAM: CT HEAD WITHOUT CONTRAST TECHNIQUE: Contiguous axial images were obtained from the base of the skull through the vertex without intravenous contrast. RADIATION DOSE REDUCTION: This exam was performed according to the departmental dose-optimization program which includes automated exposure control, adjustment of the mA and/or kV according to patient size and/or use of iterative reconstruction technique. COMPARISON:  None Available. FINDINGS: Brain: Mild cerebral atrophy. No ventricular dilatation. Patchy low-attenuation changes in the deep white matter consistent small vessel ischemia. Focal low-attenuation in the left periventricular white matter may represent acute or subacute lacunar infarct. Consider MRI for further evaluation. No mass effect or midline shift. No abnormal extra-axial fluid collections. Gray-white matter junctions are distinct. Basal cisterns are not effaced. No acute intracranial hemorrhage. Vascular: Intracranial arterial vascular calcifications. Skull: Calvarium appears intact. Sinuses/Orbits: Paranasal sinuses and mastoid air cells are clear. Other: None. 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 No significant mass effect or midline shift. No acute intracranial hemorrhage. Electronically Signed   By: Lucienne Capers M.D.   On: 01/16/2022 23:36   MR BRAIN WO CONTRAST  Result Date: 01/17/2022 CLINICAL DATA:  Left arm weakness, tingling EXAM: MRI HEAD WITHOUT CONTRAST TECHNIQUE: Multiplanar, multiecho pulse sequences of the brain and  surrounding structures were obtained without intravenous contrast. COMPARISON:  No prior MRI, correlation is made with CT head 01/16/2022 FINDINGS: Brain: No restricted diffusion to suggest acute or subacute infarct. No acute hemorrhage, mass, mass effect, or midline shift. No hydrocephalus or extra-axial collection. Remote lacunar infarct in the left basal ganglia and corona radiata. Vascular: Normal arterial flow voids. Skull and upper cervical spine: Normal marrow signal. Sinuses/Orbits: No acute finding. Other: The mastoids are well aerated. IMPRESSION: No acute intracranial process.  No acute or subacute infarct. Electronically Signed   By: Merilyn Baba M.D.   On: 01/17/2022 02:10      IMPRESSION AND PLAN:  Assessment and Plan: * TIA (transient ischemic attack) - The patient has left-sided numbness and left lacunar infarct of indeterminate etiology.  Will need to rule out evolving CVA. -He will be admitted to an observation medical telemetry bed. - We will follow neurochecks every 4 hours for 24 hours. - We will obtain a brain MRI without contrast as well as bilateral carotid Doppler and 2D echo with bubble study. - The patient be placed on aspirin and Plavix. - Statin therapy will be provided and  fasting lipids will be checked. - Neurology consult will be obtained. - I notified Dr. Quinn Axe about the patient. - PT/OT and ST consults will be obtained.  Essential hypertension - We will continue antihypertensives with permissive parameters.  BPH (benign prostatic hyperplasia) - We will continue Flomax.  Stage III chronic kidney disease ([place]) - Renal functions are stable with stage IIIa CKD. - He will be hydrated with IV normal saline and will follow BMP.   DVT prophylaxis: Lovenox.  Advanced Care Planning:  Code Status: full code.  Family Communication:  The plan of care was discussed in details with the patient (and family). I answered all questions. The patient agreed to proceed  with the above mentioned plan. Further management will depend upon hospital course. Disposition Plan: Back to previous home environment Consults called: Neurology.   All the records are reviewed and case discussed with ED provider.  Status is: Observation  I certify that at the time of admission, it is my clinical judgment that the patient will require hospital care extending less than 2 midnights.                            Dispo: The patient is from: Home              Anticipated d/c is to: Home              Patient currently is not medically stable to d/c.              Difficult to place patient: No  Christel Mormon M.D on 01/17/2022 at 4:32 AM  [facility]   From 7 PM-7 AM, contact night-coverage [email or web address]  CC: Primary care physician; Steele Sizer, MD

## 2022-01-17 NOTE — ED Notes (Signed)
ED Provider at bedside. 

## 2022-01-17 NOTE — Assessment & Plan Note (Addendum)
-   Renal functions are stable with stage IIIa CKD. - He will be hydrated with IV normal saline and will follow BMP.

## 2022-01-17 NOTE — Progress Notes (Signed)
*  PRELIMINARY RESULTS* Echocardiogram 2D Echocardiogram has been performed.  Sherrie Sport 01/17/2022, 2:23 PM

## 2022-01-17 NOTE — Progress Notes (Addendum)
SLP Cancellation Note  Patient Details Name: Frederick Nelson MRN: [id number] DOB: Dec 20, 1950   Cancelled treatment:       Reason Eval/Treat Not Completed: SLP screened, no needs identified, will sign off (chart reviewed; consulted NSG then met w/ pt in room) Pt eating his breakfast meal this morning upon entering room. Pt denied any difficulty swallowing and is currently on a regular diet; tolerated swallowing pills w/ water last night w/ NSG. Pt conversed in conversation w/out expressive/receptive deficits noted; pt denied any new/current speech-language deficits. Speech clear, intelligible during conversation. He stated he is speaking at his normal rate of speech; takes his time normally. He felt that he might have been "sleepy and tired" when talking w/ the MD at ~2am this morning. Pt made his wants/needs known re: coffee; something else to drink. Menu provided also. No further skilled ST services indicated as pt appears at his baseline. Pt agreed. NSG to reconsult if any change in status while admitted.       Orinda Kenner, MS, CCC-SLP Speech Language Pathologist Rehab Services; [place] [phone] (ascom) Gabryela Kimbrell 01/17/2022, 8:20 AM

## 2022-01-17 NOTE — Progress Notes (Signed)
No charge progress note.  Frederick Nelson is a 71 y.o. African-American male with medical history significant for stage IIIa chronic kidney disease, GERD, hypertension, dyslipidemia and osteoarthritis, who presented to the emergency room with acute onset of left-sided numbness and weakness that started yesterday.  It affected his left side of the lower face earlier in the afternoon that lasted 3 minutes and as he was driving his car in the evening he experienced another episode of left hand numbness while in the waiting room also lasted few minutes and later resolved.Marland Kitchen  He denied any headache or dizziness or blurred vision.  No other paresthesias or focal muscle weakness.  No tinnitus or vertigo.  No chest pain or palpitations.  No dyspnea or cough.  ED course: Upon presentation to the ER, BP was 163/73 and later 182/76 with otherwise normal vital signs.  Labs revealed a BUN of 28 and creatinine 1.88 slightly above previous levels with CKD 3a.  CBC was within normal.  EKG showed normal sinus rhythm with a rate of 60 with Q waves inferiorly. Imaging: Noncontrasted CT scan revealed left periventricular lacunar infarct of indeterminate age with no significant mass effect or midline shift and no acute intracranial hemorrhage.  2 view chest x-ray showed no acute cardiopulmonary disease.  MRI was negative for any acute abnormality or infarct.  Neurology was consulted and patient was admitted for CVA work-up. Per neurology there is a concern for crescendo TIAs and they would like to keep him for another night to complete the TIA work-up and observation.  Pending CTA and echocardiogram. Lipid profile with elevated LDL at 110 with a goal of less than 70. A1c of 5.9  Clinically patient is at baseline. Follow-up CVA work-up completion. Continue to observe for 1 more night.

## 2022-01-17 NOTE — ED Notes (Signed)
Pt in MRI.

## 2022-01-17 NOTE — Progress Notes (Signed)
PHARMACIST - PHYSICIAN ORDER COMMUNICATION  CONCERNING: P&T Medication Policy on Herbal Medications  DESCRIPTION:  This patient's order for:  Flaxseed Capsules 1000 mg  has been noted.  This product(s) is classified as an "herbal" or natural product. Due to a lack of definitive safety studies or FDA approval, nonstandard manufacturing practices, plus the potential risk of unknown drug-drug interactions while on inpatient medications, the Pharmacy and Therapeutics Committee does not permit the use of "herbal" or natural products of this type within [facility].   ACTION TAKEN: The pharmacy department is unable to verify this order at this time and your patient has been informed of this safety policy. Please reevaluate patient's clinical condition at discharge and address if the herbal or natural product(s) should be resumed at that time.

## 2022-01-17 NOTE — ED Provider Notes (Signed)
[facility] Provider Note    Event Date/Time   First MD Initiated Contact with Patient 01/17/22 0106     (approximate)   History   Extremity Weakness   HPI  Frederick Nelson is a 71 y.o. male with history of hypertension, hyperlipidemia not on medication, CKD who presents to the emergency department with complaints of left-sided numbness and weakness that happened today.  States that this afternoon he had some numbness around the left side of his lower face.  When driving his car this evening he had numbness and weakness in the left hand.  States symptoms lasted for several minutes and then resolved.  Had another episode of left hand numbness while in the waiting room.  No current numbness, tingling or weakness.  No headache or head injury.  He denies any history of previous stroke.  Not on any antiplatelet or anticoagulant.  Denies any chest pain or shortness of breath.   History provided by patient.    Past Medical History:  Diagnosis Date   Abdominal pain, right lower quadrant    Allergy    Arthritis    Benign hypertension with chronic kidney disease, stage III (HCC)    Chronic low back pain    CKD (chronic kidney disease), stage III (HCC)    Degenerative joint disease (DJD) of hip    Genital herpes    Takes Valtrex for prevention   GERD (gastroesophageal reflux disease) 1995   Hyperlipidemia    Hypertension    Malignant neoplasm of ascending colon ([place]) 2011   Special screening for malignant neoplasms, colon 2011    Past Surgical History:  Procedure Laterality Date   COLON SURGERY  06/26/2007   right hemicolectomy-T2N0 carcinoma of right colon. The tumor was 2.6 cm,low grade with zero of 14 nodes positive. No evidence of venous or lymphatic invasion   COLONOSCOPY  [id number]   COLONOSCOPY WITH PROPOFOL N/A 08/04/2015   Procedure: COLONOSCOPY WITH PROPOFOL;  Surgeon: [name], MD;  Location: [facility] ENDOSCOPY;  Service: Endoscopy;   Laterality: N/A;   COLONOSCOPY WITH PROPOFOL N/A 09/01/2020   Procedure: COLONOSCOPY WITH PROPOFOL;  Surgeon: [name], MD;  Location: [facility] ENDOSCOPY;  Service: Endoscopy;  Laterality: N/A;    MEDICATIONS:  Prior to Admission medications   Medication Sig Start Date End Date Taking? Authorizing Provider  tamsulosin (FLOMAX) 0.4 MG CAPS capsule TAKE 1 CAPSULE BY MOUTH EVERY DAY 11/23/21   Ernestine Conrad, Hunt Oris, PA-C  AMBULATORY NON FORMULARY MEDICATION Prostaglandin 10  Test Dose  71m vial   [phone] [phone]/24/23   SAbbie Sons MD  Ascorbic Acid (VITA-C PO) Take 1 tablet by mouth daily.    [provider]  febuxostat (ULORIC) 40 MG tablet Take 1 tablet (40 mg total) by mouth daily. Patient not taking: Reported on 11/22/2021 10/13/21   SSteele Sizer MD  Flaxseed, Linseed, (FLAXSEED OIL) 1000 MG CAPS Take by mouth.    [provider]  loratadine (CLARITIN) 10 MG tablet TAKE 1 TABLET BY MOUTH EVERY DAY 07/07/21   SAncil Boozer KDrue Stager MD  predniSONE (DELTASONE) 10 MG tablet Take 1 tablet (10 mg total) by mouth daily with breakfast. 10/13/21   SSteele Sizer MD  sildenafil (VIAGRA) 100 MG tablet Take 0.5-1 tablets (50-100 mg total) by mouth daily as needed for erectile dysfunction. 04/15/21   SSteele Sizer MD  valACYclovir (VALTREX) 1000 MG tablet Take 1 tablet (1,000 mg total) by  mouth daily. 10/13/21   Steele Sizer, MD  valsartan (DIOVAN) 160 MG tablet Take 1 tablet (160 mg total) by mouth daily. 10/13/21   Steele Sizer, MD  Vitamin D, Ergocalciferol, (DRISDOL) 1.25 MG (50000 UNIT) CAPS capsule Take 1 capsule (50,000 Units total) by mouth every 7 (seven) days. 10/13/21   Steele Sizer, MD    Physical Exam   Triage Vital Signs: ED Triage Vitals  Enc Vitals Group     BP 01/16/22 2050 (!) 163/73     Pulse Rate 01/16/22 2050 64     Resp 01/16/22 2050 18     Temp 01/16/22 2050 98.1 F (36.7 C)      Temp Source 01/16/22 2050 Oral     SpO2 01/16/22 2050 98 %     Weight 01/16/22 2051 207 lb (93.9 kg)     Height 01/16/22 2051 '5\' 11"'$  (1.803 m)     Head Circumference --      Peak Flow --      Pain Score --      Pain Loc --      Pain Edu? --      Excl. in [place]? --     Most recent vital signs: Vitals:   01/16/22 2345 01/17/22 0110  BP: (!) 182/76 (!) 170/82  Pulse: 66 (!) 58  Resp: 18 17  Temp: 98 F (36.7 C)   SpO2: 100% 100%    CONSTITUTIONAL: Alert and oriented and responds appropriately to questions. Well-appearing; well-nourished HEAD: Normocephalic, atraumatic EYES: Conjunctivae clear, pupils appear equal, sclera nonicteric ENT: normal nose; moist mucous membranes NECK: Supple, normal ROM CARD: RRR; S1 and S2 appreciated; no murmurs, no clicks, no rubs, no gallops RESP: Normal chest excursion without splinting or tachypnea; breath sounds clear and equal bilaterally; no wheezes, no rhonchi, no rales, no hypoxia or respiratory distress, speaking full sentences ABD/GI: Normal bowel sounds; non-distended; soft, non-tender, no rebound, no guarding, no peritoneal signs BACK: The back appears normal EXT: Normal ROM in all joints; no deformity noted, no edema; no cyanosis SKIN: Normal color for age and race; warm; no rash on exposed skin NEURO: Moves all extremities equally, normal speech PSYCH: The patient's mood and manner are appropriate.   ED Results / Procedures / Treatments   LABS: (all labs ordered are listed, but only abnormal results are displayed) Labs Reviewed  BASIC METABOLIC PANEL - Abnormal; Notable for the following components:      Result Value   Glucose, Bld 100 (*)    BUN 28 (*)    Creatinine, Ser 1.88 (*)    GFR, Estimated 38 (*)    All other components within normal limits  CBG MONITORING, ED - Abnormal; Notable for the following components:   Glucose-Capillary 116 (*)    All other components within normal limits  CBC  URINALYSIS, ROUTINE W REFLEX  MICROSCOPIC  LIPID PANEL  HEMOGLOBIN A1C  HIV ANTIBODY (ROUTINE TESTING W REFLEX)  BASIC METABOLIC PANEL  CBC  TROPONIN I (HIGH SENSITIVITY)  TROPONIN I (HIGH SENSITIVITY)     EKG:  EKG Interpretation  Date/Time:  Monday Jan 16 2022 21:01:15 EDT Ventricular Rate:  60 PR Interval:  152 QRS Duration: 62 QT Interval:  400 QTC Calculation: 400 R Axis:   30 Text Interpretation: Normal sinus rhythm Normal ECG No previous ECGs available Confirmed by Pryor Curia [phone]) on 01/17/2022 1:43:16 AM         RADIOLOGY: My personal review and interpretation of imaging: Chest x-ray clear.  CT  head unremarkable.  I have personally reviewed all radiology reports.   DG Chest 2 View  Result Date: 01/16/2022 CLINICAL DATA:  Left arm weakness and tingling. EXAM: CHEST - 2 VIEW COMPARISON:  None Available. FINDINGS: Shallow inspiration. Normal heart size and pulmonary vascularity. No focal airspace disease or consolidation in the lungs. No blunting of costophrenic angles. No pneumothorax. Mediastinal contours appear intact. IMPRESSION: No active cardiopulmonary disease. Electronically Signed   By: Lucienne Capers M.D.   On: 01/16/2022 21:22   CT HEAD WO CONTRAST (5MM)  Result Date: 01/16/2022 CLINICAL DATA:  Neurological deficit, acute, stroke suspected. Left arm weakness and tingling over the last several days. EXAM: CT HEAD WITHOUT CONTRAST TECHNIQUE: Contiguous axial images were obtained from the base of the skull through the vertex without intravenous contrast. RADIATION DOSE REDUCTION: This exam was performed according to the departmental dose-optimization program which includes automated exposure control, adjustment of the mA and/or kV according to patient size and/or use of iterative reconstruction technique. COMPARISON:  None Available. FINDINGS: Brain: Mild cerebral atrophy. No ventricular dilatation. Patchy low-attenuation changes in the deep white matter consistent small vessel ischemia.  Focal low-attenuation in the left periventricular white matter may represent acute or subacute lacunar infarct. Consider MRI for further evaluation. No mass effect or midline shift. No abnormal extra-axial fluid collections. Gray-white matter junctions are distinct. Basal cisterns are not effaced. No acute intracranial hemorrhage. Vascular: Intracranial arterial vascular calcifications. Skull: Calvarium appears intact. Sinuses/Orbits: Paranasal sinuses and mastoid air cells are clear. Other: None. 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 No significant mass effect or midline shift. No acute intracranial hemorrhage. Electronically Signed   By: Lucienne Capers M.D.   On: 01/16/2022 23:36     PROCEDURES:  Critical Care performed: No     .1-3 Lead EKG Interpretation Performed by: Tavarion Babington, Delice Bison, DO Authorized by: Jamone Garrido, Delice Bison, DO     Interpretation: normal     ECG rate:  58   ECG rate assessment: normal     Rhythm: sinus rhythm     Ectopy: none     Conduction: normal      IMPRESSION / MDM / ASSESSMENT AND PLAN / ED COURSE  I reviewed the triage vital signs and the nursing notes.    Patient here with intermittent left-sided numbness and weakness.  The patient is on the cardiac monitor to evaluate for evidence of arrhythmia and/or significant heart rate changes.   DIFFERENTIAL DIAGNOSIS (includes but not limited to):   TIA, CVA, intracranial hemorrhage, ACS   PLAN: We will obtain CBC, BMP, troponin, CT of the head, EKG.   MEDICATIONS GIVEN IN ED: Medications  aspirin chewable tablet 324 mg (has no administration in time range)  irbesartan (AVAPRO) tablet 150 mg (has no administration in time range)  tamsulosin (FLOMAX) capsule 0.4 mg (has no administration in time range)  Ascorbic Acid CRYS (has no administration in time range)  Vitamin D (Ergocalciferol) (DRISDOL) capsule 50,000 Units (has no administration in time range)  loratadine  (CLARITIN) tablet 10 mg (has no administration in time range)   stroke: early stages of recovery book (has no administration in time range)  enoxaparin (LOVENOX) injection 40 mg (has no administration in time range)  0.9 %  sodium chloride infusion (has no administration in time range)  acetaminophen (TYLENOL) tablet 650 mg (has no administration in time range)    Or  acetaminophen (TYLENOL) suppository 650 mg (has no administration in time range)  traZODone (DESYREL) tablet  25 mg (has no administration in time range)  magnesium hydroxide (MILK OF MAGNESIA) suspension 30 mL (has no administration in time range)  ondansetron (ZOFRAN) tablet 4 mg (has no administration in time range)    Or  ondansetron (ZOFRAN) injection 4 mg (has no administration in time range)     ED COURSE: Patient's labs show chronic kidney disease which is stable.  Normal electrolytes.  Normal hemoglobin.  Troponin x2 negative.  CT of the head reviewed and interpreted by myself and radiologist and shows a left periventricular lacunar infarct of indeterminate age.  Will obtain MRI of the brain without contrast.  We will give full dose aspirin.  Chest x-ray also reviewed and interpreted by myself and radiologist and shows no infiltrate, edema, pneumothorax, cardiomegaly.  Will discuss with hospitalist for admission for TIA versus CVA work-up.  He is not a tPA candidate given symptoms resolved and NIH stroke scale is 0.   CONSULTS:  Consulted and discussed patient's case with hopitalist, Dr. Sidney Ace.  I have recommended admission and consulting physician agrees and will place admission orders.  Patient (and family if present) agree with this plan.   I reviewed all nursing notes, vitals, pertinent previous records.  All labs, EKGs, imaging ordered have been independently reviewed and interpreted by myself.     OUTSIDE RECORDS REVIEWED: Reviewed patient's last office visit with Dr. Holley Raring on 07/18/2021.         FINAL  CLINICAL IMPRESSION(S) / ED DIAGNOSES   Final diagnoses:  Stroke-like symptoms     Rx / DC Orders   ED Discharge Orders     None        Note:  This document was prepared using Dragon voice recognition software and may include unintentional dictation errors.   Cortney Mckinney, Delice Bison, DO 01/17/22 [phone]

## 2022-01-17 NOTE — Assessment & Plan Note (Signed)
-   We will continue antihypertensives with permissive parameters. 

## 2022-01-17 NOTE — Assessment & Plan Note (Addendum)
-   The patient has left-sided numbness and left lacunar infarct of indeterminate etiology.  Will need to rule out evolving CVA. -He will be admitted to an observation medical telemetry bed. - We will follow neurochecks every 4 hours for 24 hours. - We will obtain a brain MRI without contrast as well as bilateral carotid Doppler and 2D echo with bubble study. - The patient be placed on aspirin and Plavix. - Statin therapy will be provided and fasting lipids will be checked. - Neurology consult will be obtained. - I notified Dr. Quinn Axe about the patient. - PT/OT and ST consults will be obtained.

## 2022-01-17 NOTE — Progress Notes (Signed)
Admission profile updated. ?

## 2022-01-18 DIAGNOSIS — G459 Transient cerebral ischemic attack, unspecified: Secondary | ICD-10-CM | POA: Diagnosis not present

## 2022-01-18 DIAGNOSIS — G450 Vertebro-basilar artery syndrome: Secondary | ICD-10-CM

## 2022-01-18 MED ORDER — ATORVASTATIN CALCIUM 20 MG PO TABS: 20.0000 mg | ORAL_TABLET | Freq: Every day | ORAL | 0 refills | Status: DC

## 2022-01-18 MED ORDER — ASPIRIN 81 MG PO CHEW: 81.0000 mg | CHEWABLE_TABLET | Freq: Every day | ORAL | 11 refills | Status: AC

## 2022-01-18 MED ORDER — CLOPIDOGREL BISULFATE 75 MG PO TABS: 75.0000 mg | ORAL_TABLET | Freq: Every day | ORAL | 2 refills | Status: DC

## 2022-01-18 NOTE — Care Management (Signed)
  Transition of Care (TOC) Screening Note   Patient Details  Name: Frederick Nelson Date of Birth: 08/23/1951   Transition of Care [facility]) CM/SW Contact:    Pete Pelt, RN Phone Number: 01/18/2022, 11:22 AM    Transition of Care Department [facility]) has reviewed patient and no TOC needs have been identified at this time. We will continue to monitor patient advancement through interdisciplinary progression rounds. If new patient transition needs arise, please place a TOC consult.

## 2022-01-18 NOTE — Plan of Care (Signed)
Neurology plan of care  TTE showed no intracardiac clot or other sig abnl. Neg for PFO. 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 Recommend ASA '81mg'$  daily + plavix '75mg'$  daily x21 days f/b ASA '81mg'$  daily monotherapy after that. Continue atorvastatin '20mg'$  daily. OK to discharge from neuro standpoint. Amb referral to neurology placed for outpatient neuro f/u.  Su Monks, MD [facility] [phone]  If 7pm- 7am, please page neurology on call as listed in [place].

## 2022-01-18 NOTE — Discharge Summary (Signed)
Physician Discharge Summary  Frederick Nelson LDJ:[id number] DOB: 08-01-1951 DOA: 01/17/2022  PCP: Steele Sizer, MD  Admit date: 01/17/2022 Discharge date: 01/18/2022 Recommendations for Outpatient Follow-up:  Follow up with PCP in 1 weeks-call for appointment Please obtain BMP/CBC in one week  Discharge Dispo: Home Discharge Condition: Stable Code Status:   Code Status: Full Code Diet recommendation:  Diet Order             Diet Heart Room service appropriate? Yes; Fluid consistency: Thin  Diet effective now                    Brief/Interim Summary: 71 year old male with history of stage IIIa chronic kidney disease, GERD, hypertension, dyslipidemia and osteoarthritis, coming with episodic left sided facial numbness and later left hand numbness and weakness which both resolved.Brain MRI came back negative.  Patient admitted under telemetry with neurochecks echo carotid duplex, neuro PT OT consulted,  started on statin aspirin Plavix statin.  Per neurology concern for crescendo TIA and patient was monitored overnight, lipid profile with LDL of 110 A1c 5.9.  MR angio head, neck: left V4 segment likely occluded or severely stenosed, multifocal intracranial atherosclerotic disease with severe narrowing in the distal right V4, severe narrowing in the mid and distal basilar artery, moderate to severe narrowing in the proximal left A2 and moderate narrowing of the P2 segment. Started on aspirin Plavix -discussed Dr. Artemio Aly advised to keep that for 90 days afterwards can continue aspirin alone given patient's severe left vertebral basilar insufficiency.Patient did well with PT OT, BP was poorly controlled-on home valsartan-which he attributes to was not taking the medication prior to coming to the hospital.  Reluctant to increase/add the medication and is going to see his doctor soon   Discharge Diagnoses:  Principal Problem:   TIA (transient ischemic attack) Active Problems:   Essential  hypertension   Stage III chronic kidney disease (HCC)   BPH (benign prostatic hyperplasia)   Vertebrobasilar insufficiency  Assessment and Plan: TIA w/ left-sided numbness and left lacunar infarct of indeterminate etiology. Severe vertebrobasilar insufficiency; Per neurology concern for crescendo TIA and patient was monitored overnight, lipid profile with LDL of 110 A1c 5.9.  MR angio head, neck: left V4 segment likely occluded or severely stenosed, multifocal intracranial atherosclerotic disease with severe narrowing in the distal right V4, severe narrowing in the mid and distal basilar artery, moderate to severe narrowing in the proximal left A2 and moderate narrowing of the P2 segment. -neuro feels he has severe vertebrobasilar insufficiency on his MRA, which  is suspect to be unrelated to his sx, but neurology has recommended DAPT x90 days with ASA '81mg'$  daily + plavix '75mg'$  daily). After that he can continue on ASA '81mg'$  daily alone.   Essential hypertension Borderline controlled continue valsartan.is reluctant to add or increase the medication and he would like to follow-up with his primary care doctor and adjust medication  BPH (benign prostatic hyperplasia)Continue Flomax Stage III chronic kidney disease ([facility]) At baseline stable. Recent Labs  Lab 01/16/22 2057 01/17/22 0310  BUN 28* 28*  CREATININE 1.88* 1.62*    Consults: Neurology Subjective: Alert awake oriented resting comfortably denies any numbness tingling or weakness this morning.  Discharge Exam: Vitals:   01/18/22 0351 01/18/22 0807  BP: (!) 176/81 (!) 176/81  Pulse: (!) 53 (!) 56  Resp: 20 16  Temp: 97.7 F (36.5 C) 97.6 F (36.4 C)  SpO2: 98% 100%   General: Pt is alert, awake, not in  acute distress Cardiovascular: RRR, S1/S2 +, no rubs, no gallops Respiratory: CTA bilaterally, no wheezing, no rhonchi Abdominal: Soft, NT, ND, bowel sounds + Extremities: no edema, no cyanosis  Discharge  Instructions  Discharge Instructions     Ambulatory referral to Neurology   Complete by: As directed    An appointment is requested in approximately: 4 weeks   Discharge instructions   Complete by: As directed    Follow-up with PCP in 1 week, follow-up with neurology in 1 week.   Increase activity slowly   Complete by: As directed       Allergies as of 01/18/2022       Reactions   Ace Inhibitors Swelling   Ciprofloxacin    Tendon damage        Medication List     TAKE these medications    AMBULATORY NON FORMULARY MEDICATION Prostaglandin 10  Test Dose  37m vial   Qty #3 Refills 0  CLos [phone] [phone]  aspirin 81 MG chewable tablet Chew 1 tablet (81 mg total) by mouth daily. Start taking on: Jan 19, 2022   atorvastatin 20 MG tablet Commonly known as: LIPITOR Take 1 tablet (20 mg total) by mouth daily. Start taking on: Jan 19, 2022   clopidogrel 75 MG tablet Commonly known as: PLAVIX Take 1 tablet (75 mg total) by mouth daily. Start taking on: Jan 19, 2022   Flaxseed Oil 1000 MG Caps Take 1 capsule by mouth daily.   loratadine 10 MG tablet Commonly known as: CLARITIN TAKE 1 TABLET BY MOUTH EVERY DAY   sildenafil 100 MG tablet Commonly known as: Viagra Take 0.5-1 tablets (50-100 mg total) by mouth daily as needed for erectile dysfunction.   tamsulosin 0.4 MG Caps capsule Commonly known as: FLOMAX TAKE 1 CAPSULE BY MOUTH EVERY DAY   valACYclovir 1000 MG tablet Commonly known as: VALTREX Take 1 tablet (1,000 mg total) by mouth daily.   valsartan 160 MG tablet Commonly known as: DIOVAN Take 1 tablet (160 mg total) by mouth daily.   VITA-C PO Take 1 tablet by mouth daily.   Vitamin D (Ergocalciferol) 1.25 MG (50000 UNIT) Caps capsule Commonly known as: DRISDOL Take 1 capsule (50,000 Units total) by mouth every 7 (seven) days.        Follow-up Information     SSteele Sizer MD Follow up in 1 week(s).    Specialty: Family Medicine Contact information: [phone]               Allergies  Allergen Reactions   Ace Inhibitors Swelling   Ciprofloxacin     Tendon damage    The results of significant diagnostics from this hospitalization (including imaging, microbiology, ancillary and laboratory) are listed below for reference.    Microbiology: No results found for this or any previous visit (from the past 240 hour(s)).  Procedures/Studies: DG Chest 2 View  Result Date: 01/16/2022 CLINICAL DATA:  Left arm weakness and tingling. EXAM: CHEST - 2 VIEW COMPARISON:  None Available. FINDINGS: Shallow inspiration. Normal heart size and pulmonary vascularity. No focal airspace disease or consolidation in the lungs. No blunting of costophrenic angles. No pneumothorax. Mediastinal contours appear intact. IMPRESSION: No active cardiopulmonary disease. Electronically Signed   By: WLucienne CapersM.D.   On: 01/16/2022 21:22   CT HEAD WO CONTRAST (5MM)  Result Date: 01/16/2022 CLINICAL DATA:  Neurological deficit, acute, stroke suspected. Left arm weakness and tingling  over the last several days. EXAM: CT HEAD WITHOUT CONTRAST TECHNIQUE: Contiguous axial images were obtained from the base of the skull through the vertex without intravenous contrast. RADIATION DOSE REDUCTION: This exam was performed according to the departmental dose-optimization program which includes automated exposure control, adjustment of the mA and/or kV according to patient size and/or use of iterative reconstruction technique. COMPARISON:  None Available. FINDINGS: Brain: Mild cerebral atrophy. No ventricular dilatation. Patchy low-attenuation changes in the deep white matter consistent small vessel ischemia. Focal low-attenuation in the left periventricular white matter may represent acute or subacute lacunar infarct. Consider MRI for further evaluation. No mass effect or  midline shift. No abnormal extra-axial fluid collections. Gray-white matter junctions are distinct. Basal cisterns are not effaced. No acute intracranial hemorrhage. Vascular: Intracranial arterial vascular calcifications. Skull: Calvarium appears intact. Sinuses/Orbits: Paranasal sinuses and mastoid air cells are clear. Other: None. 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 No significant mass effect or midline shift. No acute intracranial hemorrhage. Electronically Signed   By: Lucienne Capers M.D.   On: 01/16/2022 23:36   MR ANGIO HEAD WO CONTRAST  Result Date: 01/17/2022 CLINICAL DATA:  Episodes of left-sided numbness and weakness EXAM: MRA NECK WITHOUT CONTRAST MRA HEAD WITHOUT CONTRAST TECHNIQUE: Angiographic images of the Circle of Willis were acquired using MRA technique without intravenous contrast. COMPARISON:  No prior MRA, correlation is made with MRI 01/17/2022 FINDINGS: MRA NECK FINDINGS The aortic arch and the arch vessels are poorly imaged given noncontrast technique. No evidence of dissection, occlusion, or hemodynamically significant stenosis (greater than 50%) in the bilateral carotid and vertebral arteries. MRA HEAD FINDINGS Both internal carotid arteries are patent to the termini, with mild narrowing in the distal cavernous and proximal supraclinoid segments. A1 segments patent. Normal anterior communicating artery. Moderate to severe narrowing in the proximal left A2 (series 5, image 130). Anterior cerebral arteries are otherwise patent to their distal aspects with mild multifocal irregularity. No significant M1 stenosis or occlusion. Normal MCA bifurcations. Distal MCA branches perfused and symmetric. Poor signal throughout the left V4 segment, likely severe stenosis or intermittent occlusion with poor flow. Multifocal irregularity in the right V4, with severe narrowing distally (series 5, image 51), just before the vertebrobasilar junction. The proximal  basilar is patent, with severe stenosis mid and distally (series 5, image 77 and 89). Superior cerebellar arteries patent bilaterally. Patent right P1, with patent posterior communicating artery. Fetal origin of the left PCA from a patent left posterior communicating artery, which demonstrates mild narrowing proximally (series 5, image 117) and distally (series 5, image 118). Moderate multifocal narrowing in the P2 segments (series 5, image 121). Irregular but perfused P3 segments. IMPRESSION: 1. The left V4 segment is likely occluded or severely stenosed, with poor signal throughout. Multifocal intracranial atherosclerotic disease, with severe narrowing in the distal right V4, severe narrowing in the mid and distal basilar artery, moderate to severe narrowing in the proximal left A2, and moderate narrowing in the P2 segments. Additional less significant stenosis as described above. 2.  No hemodynamically significant stenosis in the neck. Electronically Signed   By: Merilyn Baba M.D.   On: 01/17/2022 22:43   MR ANGIO NECK WO CONTRAST  Result Date: 01/17/2022 CLINICAL DATA:  Episodes of left-sided numbness and weakness EXAM: MRA NECK WITHOUT CONTRAST MRA HEAD WITHOUT CONTRAST TECHNIQUE: Angiographic images of the Circle of Willis were acquired using MRA technique without intravenous contrast. COMPARISON:  No prior MRA, correlation is made with MRI 01/17/2022 FINDINGS: MRA  NECK FINDINGS The aortic arch and the arch vessels are poorly imaged given noncontrast technique. No evidence of dissection, occlusion, or hemodynamically significant stenosis (greater than 50%) in the bilateral carotid and vertebral arteries. MRA HEAD FINDINGS Both internal carotid arteries are patent to the termini, with mild narrowing in the distal cavernous and proximal supraclinoid segments. A1 segments patent. Normal anterior communicating artery. Moderate to severe narrowing in the proximal left A2 (series 5, image 130). Anterior  cerebral arteries are otherwise patent to their distal aspects with mild multifocal irregularity. No significant M1 stenosis or occlusion. Normal MCA bifurcations. Distal MCA branches perfused and symmetric. Poor signal throughout the left V4 segment, likely severe stenosis or intermittent occlusion with poor flow. Multifocal irregularity in the right V4, with severe narrowing distally (series 5, image 51), just before the vertebrobasilar junction. The proximal basilar is patent, with severe stenosis mid and distally (series 5, image 77 and 89). Superior cerebellar arteries patent bilaterally. Patent right P1, with patent posterior communicating artery. Fetal origin of the left PCA from a patent left posterior communicating artery, which demonstrates mild narrowing proximally (series 5, image 117) and distally (series 5, image 118). Moderate multifocal narrowing in the P2 segments (series 5, image 121). Irregular but perfused P3 segments. IMPRESSION: 1. The left V4 segment is likely occluded or severely stenosed, with poor signal throughout. Multifocal intracranial atherosclerotic disease, with severe narrowing in the distal right V4, severe narrowing in the mid and distal basilar artery, moderate to severe narrowing in the proximal left A2, and moderate narrowing in the P2 segments. Additional less significant stenosis as described above. 2.  No hemodynamically significant stenosis in the neck. Electronically Signed   By: Merilyn Baba M.D.   On: 01/17/2022 22:43   MR BRAIN WO CONTRAST  Result Date: 01/17/2022 CLINICAL DATA:  Left arm weakness, tingling EXAM: MRI HEAD WITHOUT CONTRAST TECHNIQUE: Multiplanar, multiecho pulse sequences of the brain and surrounding structures were obtained without intravenous contrast. COMPARISON:  No prior MRI, correlation is made with CT head 01/16/2022 FINDINGS: Brain: No restricted diffusion to suggest acute or subacute infarct. No acute hemorrhage, mass, mass effect, or  midline shift. No hydrocephalus or extra-axial collection. Remote lacunar infarct in the left basal ganglia and corona radiata. Vascular: Normal arterial flow voids. Skull and upper cervical spine: Normal marrow signal. Sinuses/Orbits: No acute finding. Other: The mastoids are well aerated. IMPRESSION: No acute intracranial process.  No acute or subacute infarct. Electronically Signed   By: Merilyn Baba M.D.   On: 01/17/2022 02:10   ECHOCARDIOGRAM LIMITED BUBBLE STUDY  Result Date: 01/17/2022    ECHOCARDIOGRAM LIMITED REPORT   Patient Name:   Frederick Nelson Date of Exam: 01/17/2022 Medical Rec #:  [id number]         Height:       71.0 in Accession #:    [id number]        Weight:       207.0 lb Date of Birth:  Sep 08, 1950         BSA:          2.140 m Patient Age:    71 years          BP:           178/84 mmHg Patient Gender: M                 HR:           56 bpm. Exam Location:  [facility] Procedure: Limited Echo,  Color Doppler, Cardiac Doppler and Saline Contrast            Bubble Study Indications:     TIA 435.9 / G45.9  History:         Patient has no prior history of Echocardiogram examinations.                  Risk Factors:Hypertension and Dyslipidemia. CKD.  Sonographer:     Sherrie Sport Referring Phys:  [id number] JAN A MANSY Diagnosing Phys: Donnelly Angelica IMPRESSIONS  1. Left ventricular ejection fraction, by estimation, is 60 to 65%. The left ventricle has normal function. The left ventricle has no regional wall motion abnormalities. Left ventricular diastolic function could not be evaluated.  2. Right ventricular systolic function is normal. The right ventricular size is normal.  3. The mitral valve is normal in structure. No evidence of mitral valve regurgitation.  4. The aortic valve is normal in structure. Aortic valve regurgitation is not visualized. No aortic stenosis is present.  5. Agitated saline contrast bubble study was negative, with no evidence of any interatrial shunt.  Conclusion(s)/Recommendation(s): Limited study. FINDINGS  Left Ventricle: Left ventricular ejection fraction, by estimation, is 60 to 65%. The left ventricle has normal function. The left ventricle has no regional wall motion abnormalities. The left ventricular internal cavity size was normal in size. There is  no left ventricular hypertrophy. Left ventricular diastolic function could not be evaluated. Right Ventricle: The right ventricular size is normal. Right ventricular systolic function is normal. Left Atrium: Left atrial size was normal in size. Right Atrium: Right atrial size was normal in size. Pericardium: There is no evidence of pericardial effusion. Mitral Valve: The mitral valve is normal in structure. Tricuspid Valve: The tricuspid valve is normal in structure. Tricuspid valve regurgitation is not demonstrated. Aortic Valve: The aortic valve is normal in structure. Aortic valve regurgitation is not visualized. No aortic stenosis is present. Aorta: The aortic root is normal in size and structure. Venous: The inferior vena cava was not well visualized. IAS/Shunts: No atrial level shunt detected by color flow Doppler. Agitated saline contrast was given intravenously to evaluate for intracardiac shunting. Agitated saline contrast bubble study was negative, with no evidence of any interatrial shunt. LEFT VENTRICLE PLAX 2D LVIDd:         4.40 cm LVIDs:         2.50 cm LV PW:         1.20 cm LV IVS:        0.90 cm  LEFT ATRIUM         Index LA diam:    3.50 cm 1.64 cm/m                        PULMONIC VALVE AORTA                 PV Vmax:        0.72 m/s Ao Root diam: 2.90 cm PV Vmean:       47.800 cm/s                       PV VTI:         0.127 m                       PV Peak grad:   2.1 mmHg  PV Mean grad:   1.0 mmHg                       RVOT Peak grad: 3 mmHg  TRICUSPID VALVE TR Peak grad:   14.9 mmHg TR Vmax:        193.00 cm/s  SHUNTS Pulmonic VTI: 0.176 m Donnelly Angelica Electronically  signed by Donnelly Angelica Signature Date/Time: 01/17/2022/5:32:18 PM    Final     Labs: BNP (last 3 results) No results for input(s): BNP in the last 8760 hours. Basic Metabolic Panel: Recent Labs  Lab 01/16/22 2057 01/17/22 0310  NA 140 139  K 4.1 4.2  CL 105 105  CO2 27 26  GLUCOSE 100* 114*  BUN 28* 28*  CREATININE 1.88* 1.62*  CALCIUM 9.3 9.2   Liver Function Tests: No results for input(s): AST, ALT, ALKPHOS, BILITOT, PROT, ALBUMIN in the last 168 hours. No results for input(s): LIPASE, AMYLASE in the last 168 hours. No results for input(s): AMMONIA in the last 168 hours. CBC: Recent Labs  Lab 01/16/22 2057 01/17/22 0310  WBC 5.7 6.1  HGB 14.1 14.9  HCT 43.1 46.8  MCV 93.9 94.5  PLT 181 190   Cardiac Enzymes: No results for input(s): CKTOTAL, CKMB, CKMBINDEX, TROPONINI in the last 168 hours. BNP: Invalid input(s): POCBNP CBG: Recent Labs  Lab 01/16/22 2055  GLUCAP 116*   D-Dimer No results for input(s): DDIMER in the last 72 hours. Hgb A1c Recent Labs    01/17/22 0310  HGBA1C 5.9*   Lipid Profile Recent Labs    01/17/22 0310  CHOL 190  HDL 50  LDLCALC 110*  TRIG 148  CHOLHDL 3.8   Thyroid function studies No results for input(s): TSH, T4TOTAL, T3FREE, THYROIDAB in the last 72 hours.  Invalid input(s): FREET3 Anemia work up No results for input(s): VITAMINB12, FOLATE, FERRITIN, TIBC, IRON, RETICCTPCT in the last 72 hours. Urinalysis    Component Value Date/Time   COLORURINE STRAW (A) 01/17/2022 1807   APPEARANCEUR CLEAR (A) 01/17/2022 1807   LABSPEC 1.008 01/17/2022 1807   PHURINE 5.0 01/17/2022 1807   GLUCOSEU NEGATIVE 01/17/2022 1807   HGBUR NEGATIVE 01/17/2022 1807   BILIRUBINUR NEGATIVE 01/17/2022 1807   KETONESUR NEGATIVE 01/17/2022 1807   PROTEINUR NEGATIVE 01/17/2022 1807   NITRITE NEGATIVE 01/17/2022 1807   LEUKOCYTESUR TRACE (A) 01/17/2022 1807   Sepsis Labs Invalid input(s): PROCALCITONIN,  WBC,  LACTICIDVEN Microbiology No  results found for this or any previous visit (from the past 240 hour(s)).   Time coordinating discharge: 25 minutes  SIGNED: Antonieta Pert, MD  [facility] 01/18/2022, 11:14 AM  If 7PM-7AM, please contact night-coverage [email or web address]

## 2022-01-18 NOTE — Hospital Course (Addendum)
71 year old male with history of stage IIIa chronic kidney disease, GERD, hypertension, dyslipidemia and osteoarthritis, coming with episodic left sided facial numbness and later left hand numbness and weakness which both resolved.Brain MRI came back negative.  Patient admitted under telemetry with neurochecks echo carotid duplex, neuro PT OT consulted,  started on statin aspirin Plavix statin.  Per neurology concern for crescendo TIA and patient was monitored overnight, lipid profile with LDL of 110 A1c 5.9.  MR angio head, neck: left V4 segment likely occluded or severely stenosed, multifocal intracranial atherosclerotic disease with severe narrowing in the distal right V4, severe narrowing in the mid and distal basilar artery, moderate to severe narrowing in the proximal left A2 and moderate narrowing of the P2 segment. Started on aspirin Plavix -discussed Dr. Artemio Aly advised to keep that for 90 days afterwards can continue aspirin alone given patient's severe left vertebral basilar insufficiency.Patient did well with PT OT, BP was poorly controlled-on home valsartan-which he attributes to was not taking the medication prior to coming to the hospital.  Reluctant to increase/add the medication and is going to see his doctor soon

## 2022-01-19 ENCOUNTER — Telehealth: Payer: Self-pay

## 2022-01-19 NOTE — Telephone Encounter (Signed)
Transition Care Management Unsuccessful Follow-up Telephone Call  Date of discharge and from where:  01/18/22 [facility]  Attempts:  1st Attempt  Reason for unsuccessful TCM follow-up call:  Left voice message

## 2022-01-25 ENCOUNTER — Encounter: Payer: Self-pay | Admitting: Family Medicine

## 2022-01-25 ENCOUNTER — Ambulatory Visit (INDEPENDENT_AMBULATORY_CARE_PROVIDER_SITE_OTHER): Payer: Medicare HMO | Admitting: Family Medicine

## 2022-01-25 VITALS — BP 128/72 | HR 74 | Temp 97.9°F | Resp 18 | Ht 71.0 in | Wt 209.1 lb

## 2022-01-25 DIAGNOSIS — I129 Hypertensive chronic kidney disease with stage 1 through stage 4 chronic kidney disease, or unspecified chronic kidney disease: Secondary | ICD-10-CM

## 2022-01-25 DIAGNOSIS — N183 Chronic kidney disease, stage 3 unspecified: Secondary | ICD-10-CM

## 2022-01-25 DIAGNOSIS — R0683 Snoring: Secondary | ICD-10-CM | POA: Insufficient documentation

## 2022-01-25 DIAGNOSIS — I10 Essential (primary) hypertension: Secondary | ICD-10-CM | POA: Diagnosis not present

## 2022-01-25 DIAGNOSIS — G450 Vertebro-basilar artery syndrome: Secondary | ICD-10-CM

## 2022-01-25 DIAGNOSIS — E7800 Pure hypercholesterolemia, unspecified: Secondary | ICD-10-CM | POA: Diagnosis not present

## 2022-01-25 DIAGNOSIS — N1831 Chronic kidney disease, stage 3a: Secondary | ICD-10-CM

## 2022-01-25 DIAGNOSIS — Z09 Encounter for follow-up examination after completed treatment for conditions other than malignant neoplasm: Secondary | ICD-10-CM

## 2022-01-25 DIAGNOSIS — G459 Transient cerebral ischemic attack, unspecified: Secondary | ICD-10-CM

## 2022-01-25 NOTE — Assessment & Plan Note (Signed)
Stroke ruled out inpatient but with severe vertebrobasilar insufficiency. Continue with risk modification, DAPT per Neuro, statin, prediabetic control. Continue to follow with Neuro.

## 2022-01-25 NOTE — Progress Notes (Signed)
   SUBJECTIVE:   CHIEF COMPLAINT / HPI:   [place]: [facility] 5/23-5/24. L sided facial numbness, L hand numbness/weakness, resolved.  Diagnosis: TIA Procedures/tests:  - Brain MRI negative. - ECHO LVEF 60-65%, no WMA - MR angio head, neck: left V4 segment likely occluded or severely stenosed, multifocal intracranial atherosclerotic disease with severe narrowing in the distal right V4, severe narrowing in the mid and distal basilar artery, moderate to severe narrowing in the proximal left A2 and moderate narrowing of the P2 segment. Concern for severe vertebrobasilar insufficiency. Consultants: Neuro, PT/OT New medications - ASA, plavix (DAPTx90 days followed by ASA '81mg'$  alone) - atorvastatin Discharge instructions:   - f/u with Neuro Status: stable - no further episodes of hand or face numbness/weakness. - tolerating medication - does report increased daytime fatigue since discharge. Snores at night, sometimes wakes himself up. Occasional headache. Sleeps with bed elevated and pillow under neck to help with snoring. - taking valsartan daily, noticing higher BP at home, systolic 845X.   OBJECTIVE:   BP 128/72 (BP Location: Right Arm, Patient Position: Sitting, Cuff Size: Normal)   Pulse 74   Temp 97.9 F (36.6 C) (Oral)   Resp 18   Ht '5\' 11"'$  (1.803 m) Comment: per patient  Wt 209 lb 1.6 oz (94.8 kg)   SpO2 98%   BMI 29.16 kg/m   Gen: well appearing, in NAD Card: RRR Lungs: CTAB Ext: WWP, no edema   ASSESSMENT/PLAN:   Problem List Items Addressed This Visit       Cardiovascular and Mediastinum   Essential hypertension    At goal today. With report of higher BP at home, recommend monitoring and keeping log. At followup, bring cuff to compare with office cuff. Obtaining labs today. No med changes today.       Relevant Orders   CBC with Differential   Basic Metabolic Panel (BMET)   Ambulatory referral to Sleep Studies   TIA (transient  ischemic attack)    Stroke ruled out inpatient but with severe vertebrobasilar insufficiency. Continue with risk modification, DAPT per Neuro, statin, prediabetic control. Continue to follow with Neuro.       Vertebrobasilar insufficiency    Continue DAPT x90 days followed by ASA '81mg'$  alone per Neuro. Continue to modify risk factors, prevent diabetes progression, continue statin.          Genitourinary   Chronic kidney disease   Relevant Orders   CBC with Differential   Basic Metabolic Panel (BMET)   Stage III chronic kidney disease ([place])   Relevant Orders   CBC with Differential   Basic Metabolic Panel (BMET)     Other   Hyperlipidemia   Relevant Orders   CBC with Differential   Basic Metabolic Panel (BMET)   Snoring    With worsened daytime fatigue, could be 2/2 poor sleep while hospitalized but with snoring, occasional morning headaches, HTN, STOP-BANG score 5, will refer for sleep study.        Relevant Orders   Ambulatory referral to Sleep Studies   Other Visit Diagnoses     Hospital discharge follow-up    -  Primary   Relevant Orders   CBC with Differential   Basic Metabolic Panel (BMET)   Benign hypertension with chronic kidney disease, stage III ([place])       Relevant Orders   CBC with Differential   Basic Metabolic Panel (BMET)           Myles Gip, DO

## 2022-01-25 NOTE — Assessment & Plan Note (Signed)
Continue DAPT x90 days followed by ASA '81mg'$  alone per Neuro. Continue to modify risk factors, prevent diabetes progression, continue statin.

## 2022-01-25 NOTE — Patient Instructions (Signed)
It was great to see you!  Our plans for today:  - Bring your blood pressure cuff with you to your next appointment with your blood pressure reading log. - Someone will call you about an appointment for a sleep study.  We are checking some labs today, we will release these results to your MyChart.  Take care and seek immediate care sooner if you develop any concerns.   Dr. Ky Barban

## 2022-01-25 NOTE — Assessment & Plan Note (Addendum)
At goal today. With report of higher BP at home, recommend monitoring and keeping log. At followup, bring cuff to compare with office cuff. Obtaining labs today. No med changes today.

## 2022-01-25 NOTE — Assessment & Plan Note (Signed)
With worsened daytime fatigue, could be 2/2 poor sleep while hospitalized but with snoring, occasional morning headaches, HTN, STOP-BANG score 5, will refer for sleep study.

## 2022-01-26 ENCOUNTER — Other Ambulatory Visit: Payer: Self-pay | Admitting: Family Medicine

## 2022-01-26 DIAGNOSIS — J302 Other seasonal allergic rhinitis: Secondary | ICD-10-CM

## 2022-01-26 LAB — CBC WITH DIFFERENTIAL/PLATELET
Absolute Monocytes: 449 cells/uL (ref 200–950)
Basophils Absolute: 88 cells/uL (ref 0–200)
Basophils Relative: 2 %
Eosinophils Absolute: 128 cells/uL (ref 15–500)
Eosinophils Relative: 2.9 %
HCT: 44.1 % (ref 38.5–50.0)
Hemoglobin: 14.9 g/dL (ref 13.2–17.1)
Lymphs Abs: 2134 cells/uL (ref 850–3900)
MCH: 31.4 pg (ref 27.0–33.0)
MCHC: 33.8 g/dL (ref 32.0–36.0)
MCV: 93 fL (ref 80.0–100.0)
MPV: 11.6 fL (ref 7.5–12.5)
Monocytes Relative: 10.2 %
Neutro Abs: 1602 cells/uL (ref 1500–7800)
Neutrophils Relative %: 36.4 %
Platelets: 175 10*3/uL (ref 140–400)
RBC: 4.74 10*6/uL (ref 4.20–5.80)
RDW: 12.9 % (ref 11.0–15.0)
Total Lymphocyte: 48.5 %
WBC: 4.4 10*3/uL (ref 3.8–10.8)

## 2022-01-26 LAB — BASIC METABOLIC PANEL
BUN/Creatinine Ratio: 14 (calc) (ref 6–22)
BUN: 23 mg/dL (ref 7–25)
CO2: 28 mmol/L (ref 20–32)
Calcium: 9.9 mg/dL (ref 8.6–10.3)
Chloride: 105 mmol/L (ref 98–110)
Creat: 1.67 mg/dL — ABNORMAL HIGH (ref 0.70–1.28)
Glucose, Bld: 102 mg/dL — ABNORMAL HIGH (ref 65–99)
Potassium: 5 mmol/L (ref 3.5–5.3)
Sodium: 142 mmol/L (ref 135–146)

## 2022-02-18 ENCOUNTER — Other Ambulatory Visit: Payer: Self-pay | Admitting: Urology

## 2022-02-21 ENCOUNTER — Telehealth: Payer: Self-pay | Admitting: Family Medicine

## 2022-02-21 NOTE — Telephone Encounter (Signed)
Copied from [place] [id number]. Topic: Referral - Question >> Feb 21, 2022  3:59 PM Leitha Schuller wrote: Reason for CRM: [place] states sleep study referral is missing signed order and study type  Please fax missing info to  [phone]

## 2022-03-03 ENCOUNTER — Other Ambulatory Visit: Payer: Self-pay | Admitting: Family Medicine

## 2022-03-03 NOTE — Telephone Encounter (Signed)
Requested medication (s) are due for refill today: yes  Requested medication (s) are on the active medication list: yes  Last refill:  01/19/22 #30/0  Future visit scheduled: yes  Notes to clinic:  Unable to refill per protocol, last refill by another provider..    Requested Prescriptions  Pending Prescriptions Disp Refills   atorvastatin (LIPITOR) 20 MG tablet 30 tablet 0    Sig: Take 1 tablet (20 mg total) by mouth daily.     Cardiovascular:  Antilipid - Statins Failed - 03/03/2022  4:56 PM      Failed - Lipid Panel in normal range within the last 12 months    Cholesterol, Total  Date Value Ref Range Status  06/18/2015 173 100 - 199 mg/dL Final   Cholesterol  Date Value Ref Range Status  01/17/2022 190 0 - 200 mg/dL Final   LDL Cholesterol (Calc)  Date Value Ref Range Status  04/29/2021 128 (H) mg/dL (calc) Final    Comment:    Reference range: <100 . Desirable range <100 mg/dL for primary prevention;   <70 mg/dL for patients with CHD or diabetic patients  with > or = 2 CHD risk factors. Marland Kitchen LDL-C is now calculated using the Martin-Hopkins  calculation, which is a validated novel method providing  better accuracy than the Friedewald equation in the  estimation of LDL-C.  Cresenciano Genre et al. Annamaria Helling. 4696;295(28): 2061-2068  (http://education.[employer].com/faq/FAQ164)    LDL Cholesterol  Date Value Ref Range Status  01/17/2022 110 (H) 0 - 99 mg/dL Final    Comment:           Total Cholesterol/HDL:CHD Risk Coronary Heart Disease Risk Table                     Men   Women  1/2 Average Risk   3.4   3.3  Average Risk       5.0   4.4  2 X Average Risk   9.6   7.1  3 X Average Risk  23.4   11.0        Use the calculated Patient Ratio above and the CHD Risk Table to determine the patient's CHD Risk.        ATP III CLASSIFICATION (LDL):  <100     mg/dL   Optimal  100-129  mg/dL   Near or Above                    Optimal  130-159  mg/dL   Borderline  160-189   mg/dL   High  >190     mg/dL   Very High Performed at [facility], [place], [place] [address]    HDL  Date Value Ref Range Status  01/17/2022 50 >40 mg/dL Final  06/18/2015 62 >39 mg/dL Final    Comment:    According to ATP-III Guidelines, HDL-C >59 mg/dL is considered a negative risk factor for CHD.    Triglycerides  Date Value Ref Range Status  01/17/2022 148 <150 mg/dL Final         Passed - Patient is not pregnant      Passed - Valid encounter within last 12 months    Recent Outpatient Visits           1 month ago Hospital discharge follow-up   [place], DO   4 months ago Stage 3a chronic kidney disease [facility])   [facility] Steele Sizer, MD   10 months ago Well adult exam   [facility] Steele Sizer, MD   10 months ago Stage 3a chronic kidney disease [facility])   [facility] Steele Sizer, MD   11 months ago Right ankle swelling   [facility] Kathrine Haddock, NP       Future Appointments             In 1 month Ancil Boozer, Drue Stager, MD [facility], [place]   In 5 months  [facility], [facility]

## 2022-03-03 NOTE — Telephone Encounter (Unsigned)
Copied from [place] [id number]. Topic: General - Other >> Mar 03, 2022  4:35 PM Everette C wrote: Reason for CRM: Medication Refill - Medication: atorvastatin (LIPITOR) 20 MG tablet [[id number]]   Has the patient contacted their pharmacy? Yes.  The patient has been directed to contact their PCP (Agent: If no, request that the patient contact the pharmacy for the refill. If patient does not wish to contact the pharmacy document the reason why and proceed with request.) (Agent: If yes, when and what did the pharmacy advise?)  Preferred Pharmacy (with phone number or street name): [id number] [address] [address] [id number]: [phone]: [phone]: Not open 24 hours   Has the patient been seen for an appointment in the last year OR does the patient have an upcoming appointment? Yes.    Agent: Please be advised that RX refills may take up to 3 business days. We ask that you follow-up with your pharmacy.

## 2022-03-06 MED ORDER — ATORVASTATIN CALCIUM 20 MG PO TABS: 20.0000 mg | ORAL_TABLET | Freq: Every day | ORAL | 0 refills | Status: DC

## 2022-03-07 ENCOUNTER — Other Ambulatory Visit: Payer: Self-pay | Admitting: Family Medicine

## 2022-03-07 DIAGNOSIS — N183 Chronic kidney disease, stage 3 unspecified: Secondary | ICD-10-CM

## 2022-03-07 DIAGNOSIS — I10 Essential (primary) hypertension: Secondary | ICD-10-CM

## 2022-03-07 DIAGNOSIS — N1831 Chronic kidney disease, stage 3a: Secondary | ICD-10-CM

## 2022-03-29 ENCOUNTER — Other Ambulatory Visit: Payer: Self-pay | Admitting: Family Medicine

## 2022-04-12 ENCOUNTER — Encounter: Payer: Self-pay | Admitting: Emergency Medicine

## 2022-04-12 ENCOUNTER — Other Ambulatory Visit: Payer: Self-pay

## 2022-04-12 ENCOUNTER — Emergency Department
Admission: EM | Admit: 2022-04-12 | Discharge: 2022-04-12 | Discharge status: Against Medical Advice | Payer: Medicare HMO | Attending: Emergency Medicine | Admitting: Emergency Medicine

## 2022-04-12 DIAGNOSIS — Z5321 Procedure and treatment not carried out due to patient leaving prior to being seen by health care provider: Secondary | ICD-10-CM | POA: Insufficient documentation

## 2022-04-12 DIAGNOSIS — R55 Syncope and collapse: Secondary | ICD-10-CM | POA: Insufficient documentation

## 2022-04-12 DIAGNOSIS — R0789 Other chest pain: Secondary | ICD-10-CM | POA: Insufficient documentation

## 2022-04-12 DIAGNOSIS — R42 Dizziness and giddiness: Secondary | ICD-10-CM | POA: Insufficient documentation

## 2022-04-12 HISTORY — DX: Cerebral infarction, unspecified: I63.9

## 2022-04-12 LAB — CBC
HCT: 43.3 % (ref 39.0–52.0)
Hemoglobin: 14.1 g/dL (ref 13.0–17.0)
MCH: 30.5 pg (ref 26.0–34.0)
MCHC: 32.6 g/dL (ref 30.0–36.0)
MCV: 93.5 fL (ref 80.0–100.0)
Platelets: 211 10*3/uL (ref 150–400)
RBC: 4.63 MIL/uL (ref 4.22–5.81)
RDW: 12.4 % (ref 11.5–15.5)
WBC: 5.4 10*3/uL (ref 4.0–10.5)
nRBC: 0 % (ref 0.0–0.2)

## 2022-04-12 LAB — BASIC METABOLIC PANEL
Anion gap: 8 (ref 5–15)
BUN: 26 mg/dL — ABNORMAL HIGH (ref 8–23)
CO2: 28 mmol/L (ref 22–32)
Calcium: 9.1 mg/dL (ref 8.9–10.3)
Chloride: 106 mmol/L (ref 98–111)
Creatinine, Ser: 1.82 mg/dL — ABNORMAL HIGH (ref 0.61–1.24)
GFR, Estimated: 39 mL/min — ABNORMAL LOW (ref >60)
Glucose, Bld: 111 mg/dL — ABNORMAL HIGH (ref 70–99)
Potassium: 3.8 mmol/L (ref 3.5–5.1)
Sodium: 142 mmol/L (ref 135–145)

## 2022-04-12 LAB — TROPONIN I (HIGH SENSITIVITY): Troponin I (High Sensitivity): 5 ng/L (ref <18)

## 2022-04-12 NOTE — ED Triage Notes (Signed)
Patient to ED for near syncopal episode when grocery shopping. Patient said he suddenly got dizzy and weak with slight chest discomfort. Patient states he did not pass out and chest discomfort has now resolved.

## 2022-04-13 NOTE — Progress Notes (Signed)
Name: Frederick Nelson   MRN: [id number]    DOB: 1950/08/30   Date:04/14/2022       Progress Note  Subjective  Chief Complaint  Follow Up  HPI  Balance problems/ visual changes and also paresthesia: he has intermittent balance problems, also paresthesia - on and off - feels like burning or itching of skin anywhere on his body it lasts a few minutes per episode on and off intermittently. He also has intermittent visual disturbance , he states it got worse prior to the TIA back in May but since the event visual problems resolved. He has noticed increase in dizziness over the past couple of weeks.   Chest pain and diaphoresis: he worked in his yard on 08/16 after that he had lunch and noticed some left side chest discomfort but it was mild so he went to [employer]. At the store he felt dizzy followed by diaphoresis, increase in dizziness and nausea, he sat down and symptoms resolved but he went to to urgent care to be evaluated, from there he was sent to [facility], had some labs and an EKG but he left due to the wait. Reviewed studies with patient. GFR dropped, troponin normal ( only one  ) and some changes noticed on EKG. Explained likely angina versus MI must see cardiologist, also explained needs to call 911 if recurrence of symptoms   Insomnia: he took Ambien for a while, followed by Trazodone and since August he is taking Seroquel, he states it is causing to feel sluggish and in a fog all day and affected his sex life, he is now on Belsomra 15 mg , initially it worked however noticing only lasting 4 hours, we increased the dose , he decided to stop because medication was helping her sleep at night but feel tired during the day. He was sleeping on his sofa and it helped with his sleep   HTN: he is on valsartan 160 mg daily and bp is at goal, he has noticed some intermittent dizziness and chest pain     CKI stage 3: : GFR  dropped again during his visit to [facility] two days ago. He saw nephrologist, continue  diovan    Bilateral hip pain: he has a long history of hip pain, he was  by [facility]. Dr. Ernst Spell  and had a couple of steroid injections on right hip, he states first injection worked better than second. He states pain is not as severe, he states symptoms worse at the end of the day, or when lifting objects. Pain is described as dull pain and usually 2/10 if normal activiies but goes up to 6-7 when more active . He had to stop playing golf because it aggravated symptoms. He was seen by Dr. Dorna Leitz of hips no significant arthritis noticed , he had MRI and diagnosed with mild L5 stenosis He went to PT and is feeling better   IMPRESSION: MRI done 09/2021  Chronic levoconvex lumbar scoliosis. But mild for age lumbar spine degeneration. No spinal stenosis or discrete disc herniation. No convincing right side neural impingement, with only mild bilateral foraminal stenosis at the L5 nerve levels.   ED: he takes Viagra prn, he denies side effects, discussed risk of hypotenstion with NTG. Viagra does not work well but did not like the penile pump or using penile injections. Seen by urologist and is also taking flomax now .    Gout attack: he is off HCTZ now, he has intermittent stinging ,  he is taking prednisone prn  Herpes genitalis: he is taking prophylactic medication and no recent episodes . Unchanged   Pre-diabetes: he has a history of elevated glucose, last A1C was down to 5.9% at [facility]    Patient Active Problem List   Diagnosis Date Noted   Snoring 01/25/2022   Vertebrobasilar insufficiency 01/18/2022   TIA (transient ischemic attack) 01/17/2022   BPH (benign prostatic hyperplasia) 01/17/2022   Monoclonal gammopathy 07/13/2021   Chronic kidney disease 06/06/2021   Essential hypertension 06/06/2021   Sinus bradycardia 06/19/2018   Hyperlipidemia 11/02/2017   Herpes simplex infection of penis 11/01/2017   Erectile dysfunction 08/09/2016   Vitamin D deficiency 08/09/2016    Blurred vision, bilateral 04/27/2016   Degenerative joint disease (DJD) of hip 04/27/2016   Stage III chronic kidney disease ([place]) 01/26/2016   Chronic insomnia 01/26/2016   Bilateral leg edema 01/26/2016   History of colon cancer 06/10/2015    Past Surgical History:  Procedure Laterality Date   COLON SURGERY  06/26/2007   right hemicolectomy-T2N0 carcinoma of right colon. The tumor was 2.6 cm,low grade with zero of 14 nodes positive. No evidence of venous or lymphatic invasion   COLONOSCOPY  [id number]   COLONOSCOPY WITH PROPOFOL N/A 08/04/2015   Procedure: COLONOSCOPY WITH PROPOFOL;  Surgeon: [name], MD;  Location: [facility] ENDOSCOPY;  Service: Endoscopy;  Laterality: N/A;   COLONOSCOPY WITH PROPOFOL N/A 09/01/2020   Procedure: COLONOSCOPY WITH PROPOFOL;  Surgeon: [name], MD;  Location: [facility] ENDOSCOPY;  Service: Endoscopy;  Laterality: N/A;    Family History  Problem Relation Age of Onset   Cancer Brother        cancer of the thymus   Diabetes Mother    Hypertension Father    Stroke Father    Cancer Maternal Aunt        breast cancer    Social History   Tobacco Use   Smoking status: Never   Smokeless tobacco: Never  Substance Use Topics   Alcohol use: Yes    Alcohol/week: 1.0 - 2.0 standard drink of alcohol    Types: 1 - 2 Standard drinks or equivalent per week    Comment: Socially only - Very little     Current Outpatient Medications:    AMBULATORY NON FORMULARY MEDICATION, Prostaglandin 10  Test Dose  55m vial   Qty #3 Refills 0  [phone] [phone] Disp: 3 mL, Rfl: 0   Ascorbic Acid (VITA-C PO), Take 1 tablet by mouth daily., Disp: , Rfl:    aspirin 81 MG chewable tablet, Chew 1 tablet (81 mg total) by mouth daily., Disp: 30 tablet, Rfl: 11   Flaxseed, Linseed, (FLAXSEED OIL) 1000 MG CAPS, Take 1 capsule by mouth daily., Disp: , Rfl:    loratadine (CLARITIN) 10 MG tablet, TAKE 1 TABLET BY MOUTH EVERY DAY, Disp: 90  tablet, Rfl: 1   tamsulosin (FLOMAX) 0.4 MG CAPS capsule, TAKE 1 CAPSULE BY MOUTH EVERY DAY, Disp: 90 capsule, Rfl: 0   valACYclovir (VALTREX) 1000 MG tablet, Take 1 tablet (1,000 mg total) by mouth daily., Disp: 90 tablet, Rfl: 3   Vitamin D, Ergocalciferol, (DRISDOL) 1.25 MG (50000 UNIT) CAPS capsule, Take 1 capsule (50,000 Units total) by mouth every 7 (seven) days., Disp: 12 capsule, Rfl: 1   atorvastatin (LIPITOR) 20 MG tablet, Take 1 tablet (20 mg total) by mouth daily., Disp: 90 tablet, Rfl: 0   clopidogrel (PLAVIX) 75 MG tablet, Take 1 tablet (75 mg  total) by mouth daily., Disp: 90 tablet, Rfl: 1   sildenafil (VIAGRA) 100 MG tablet, Take 0.5-1 tablets (50-100 mg total) by mouth daily as needed for erectile dysfunction., Disp: 30 tablet, Rfl: 0   valsartan (DIOVAN) 160 MG tablet, Take 1 tablet (160 mg total) by mouth daily., Disp: 90 tablet, Rfl: 0  Allergies  Allergen Reactions   Ace Inhibitors Swelling   Ciprofloxacin     Tendon damage    I personally reviewed active problem list, medication list, allergies, family history, social history, health maintenance with the patient/caregiver today.   ROS  Constitutional: Negative for fever or weight change.  Respiratory: Negative for cough and shortness of breath.   Cardiovascular: positive  for chest pain but no palpitations.  Gastrointestinal: Negative for abdominal pain, no bowel changes.  Musculoskeletal: Negative for gait problem or joint swelling.  Skin: Negative for rash.  Neurological: positive  for dizziness but no headache.  No other specific complaints in a complete review of systems (except as listed in HPI above).   Objective  Vitals:   04/14/22 0819  BP: 126/70  Pulse: 71  Resp: 16  Temp: 97.8 F (36.6 C)  TempSrc: Oral  SpO2: 99%  Weight: 206 lb 4.8 oz (93.6 kg)  Height: '5\' 11"'$  (1.803 m)    Body mass index is 28.77 kg/m.  Physical Exam  Constitutional: Patient appears well-developed and  well-nourished.  No distress.  HEENT: head atraumatic, normocephalic, pupils equal and reactive to light, neck supple Cardiovascular: Normal rate, regular rhythm and normal heart sounds.  No murmur heard. No BLE edema. Pulmonary/Chest: Effort normal and breath sounds normal. No respiratory distress. Abdominal: Soft.  There is no tenderness. Psychiatric: Patient has a normal mood and affect. behavior is normal. Judgment and thought content normal.    PHQ2/9:    04/14/2022    8:23 AM 01/25/2022   11:10 AM 10/13/2021   10:34 AM 07/28/2021    9:29 AM 04/29/2021    8:55 AM  Depression screen PHQ 2/9  Decreased Interest 0 0 0 0 0  Down, Depressed, Hopeless 0 0 0 0 0  PHQ - 2 Score 0 0 0 0 0  Altered sleeping 0 0 0    Tired, decreased energy 0 1 0    Change in appetite 0 0 0    Feeling bad or failure about yourself  0 0 0    Trouble concentrating 0 0 0    Moving slowly or fidgety/restless 0 0 0    Suicidal thoughts 0 0 0    PHQ-9 Score 0 1 0    Difficult doing work/chores  Not difficult at all       phq 9 is negative   Fall Risk:    04/14/2022    8:23 AM 01/25/2022   11:10 AM 10/13/2021   10:34 AM 07/28/2021    9:32 AM 04/29/2021    8:55 AM  Fall Risk   Falls in the past year? 0 0 0 0 0  Number falls in past yr:   0 0 0  Injury with Fall?   0 0 0  Risk for fall due to : No Fall Risks  No Fall Risks No Fall Risks No Fall Risks  Follow up Falls prevention discussed Falls prevention discussed;Education provided;Falls evaluation completed Falls prevention discussed Falls prevention discussed Falls prevention discussed     Assessment & Plan  1. Stage 3b chronic kidney disease ([place])  Seen by nephrologist   2. History of  TIAs  - Lipid Profile - Ambulatory referral to Neurology  3. EKG abnormalities  - Ambulatory referral to Cardiology  4. Chest pain in adult  - Ambulatory referral to Cardiology  5. Pure hypercholesterolemia  - Lipid Profile  6. Essential  hypertension  - valsartan (DIOVAN) 160 MG tablet; Take 1 tablet (160 mg total) by mouth daily.  Dispense: 90 tablet; Refill: 0  7. Stage 3a chronic kidney disease (HCC)  - valsartan (DIOVAN) 160 MG tablet; Take 1 tablet (160 mg total) by mouth daily.  Dispense: 90 tablet; Refill: 0  8. Benign hypertension with chronic kidney disease, stage III (HCC)  - valsartan (DIOVAN) 160 MG tablet; Take 1 tablet (160 mg total) by mouth daily.  Dispense: 90 tablet; Refill: 0  9. Erectile dysfunction, unspecified erectile dysfunction type  - sildenafil (VIAGRA) 100 MG tablet; Take 0.5-1 tablets (50-100 mg total) by mouth daily as needed for erectile dysfunction.  Dispense: 30 tablet; Refill: 0

## 2022-04-14 ENCOUNTER — Encounter: Payer: Self-pay | Admitting: Family Medicine

## 2022-04-14 ENCOUNTER — Ambulatory Visit (INDEPENDENT_AMBULATORY_CARE_PROVIDER_SITE_OTHER): Payer: Medicare HMO | Admitting: Family Medicine

## 2022-04-14 ENCOUNTER — Other Ambulatory Visit: Payer: Self-pay | Admitting: Family Medicine

## 2022-04-14 VITALS — BP 126/70 | HR 71 | Temp 97.8°F | Resp 16 | Ht 71.0 in | Wt 206.3 lb

## 2022-04-14 DIAGNOSIS — N183 Chronic kidney disease, stage 3 unspecified: Secondary | ICD-10-CM | POA: Diagnosis not present

## 2022-04-14 DIAGNOSIS — R9431 Abnormal electrocardiogram [ECG] [EKG]: Secondary | ICD-10-CM

## 2022-04-14 DIAGNOSIS — I10 Essential (primary) hypertension: Secondary | ICD-10-CM | POA: Diagnosis not present

## 2022-04-14 DIAGNOSIS — Z8673 Personal history of transient ischemic attack (TIA), and cerebral infarction without residual deficits: Secondary | ICD-10-CM

## 2022-04-14 DIAGNOSIS — N1832 Chronic kidney disease, stage 3b: Secondary | ICD-10-CM | POA: Diagnosis not present

## 2022-04-14 DIAGNOSIS — R079 Chest pain, unspecified: Secondary | ICD-10-CM | POA: Diagnosis not present

## 2022-04-14 DIAGNOSIS — N529 Male erectile dysfunction, unspecified: Secondary | ICD-10-CM

## 2022-04-14 DIAGNOSIS — E7800 Pure hypercholesterolemia, unspecified: Secondary | ICD-10-CM | POA: Diagnosis not present

## 2022-04-14 DIAGNOSIS — I129 Hypertensive chronic kidney disease with stage 1 through stage 4 chronic kidney disease, or unspecified chronic kidney disease: Secondary | ICD-10-CM | POA: Diagnosis not present

## 2022-04-14 DIAGNOSIS — N1831 Chronic kidney disease, stage 3a: Secondary | ICD-10-CM

## 2022-04-14 MED ORDER — CLOPIDOGREL BISULFATE 75 MG PO TABS: 75.0000 mg | ORAL_TABLET | Freq: Every day | ORAL | 1 refills | Status: DC

## 2022-04-14 MED ORDER — VALSARTAN 160 MG PO TABS: 160.0000 mg | ORAL_TABLET | Freq: Every day | ORAL | 0 refills | Status: DC

## 2022-04-14 MED ORDER — ATORVASTATIN CALCIUM 20 MG PO TABS: 20.0000 mg | ORAL_TABLET | Freq: Every day | ORAL | 0 refills | Status: DC

## 2022-04-14 MED ORDER — SILDENAFIL CITRATE 100 MG PO TABS: 50.0000 mg | ORAL_TABLET | Freq: Every day | ORAL | 0 refills | Status: DC | PRN

## 2022-04-17 ENCOUNTER — Other Ambulatory Visit: Payer: Self-pay

## 2022-04-17 ENCOUNTER — Encounter: Payer: Self-pay | Admitting: Family Medicine

## 2022-04-17 DIAGNOSIS — R9431 Abnormal electrocardiogram [ECG] [EKG]: Secondary | ICD-10-CM

## 2022-04-17 DIAGNOSIS — E7800 Pure hypercholesterolemia, unspecified: Secondary | ICD-10-CM

## 2022-04-17 DIAGNOSIS — N1832 Chronic kidney disease, stage 3b: Secondary | ICD-10-CM

## 2022-04-17 DIAGNOSIS — R079 Chest pain, unspecified: Secondary | ICD-10-CM

## 2022-04-17 LAB — TEST AUTHORIZATION

## 2022-04-17 LAB — LIPID PANEL
Cholesterol: 112 mg/dL (ref <200)
HDL: 39 mg/dL — ABNORMAL LOW (ref >=40)
LDL Cholesterol (Calc): 56 mg/dL (calc)
Non-HDL Cholesterol (Calc): 73 mg/dL (calc) (ref <130)
Total CHOL/HDL Ratio: 2.9 (calc) (ref <5.0)
Triglycerides: 86 mg/dL (ref <150)

## 2022-04-17 LAB — HEPATIC FUNCTION PANEL
AG Ratio: 1.3 (calc) (ref 1.0–2.5)
ALT: 29 U/L (ref 9–46)
AST: 22 U/L (ref 10–35)
Albumin: 4 g/dL (ref 3.6–5.1)
Alkaline phosphatase (APISO): 62 U/L (ref 35–144)
Bilirubin, Direct: 0.1 mg/dL (ref 0.0–0.2)
Globulin: 3 g/dL (calc) (ref 1.9–3.7)
Indirect Bilirubin: 0.5 mg/dL (calc) (ref 0.2–1.2)
Total Bilirubin: 0.6 mg/dL (ref 0.2–1.2)
Total Protein: 7 g/dL (ref 6.1–8.1)

## 2022-04-17 NOTE — Progress Notes (Signed)
Lab Added

## 2022-04-20 DIAGNOSIS — R9431 Abnormal electrocardiogram [ECG] [EKG]: Secondary | ICD-10-CM | POA: Diagnosis not present

## 2022-04-20 DIAGNOSIS — R079 Chest pain, unspecified: Secondary | ICD-10-CM | POA: Diagnosis not present

## 2022-04-20 DIAGNOSIS — R7303 Prediabetes: Secondary | ICD-10-CM | POA: Diagnosis not present

## 2022-04-20 DIAGNOSIS — N183 Chronic kidney disease, stage 3 unspecified: Secondary | ICD-10-CM | POA: Diagnosis not present

## 2022-04-20 DIAGNOSIS — I10 Essential (primary) hypertension: Secondary | ICD-10-CM | POA: Diagnosis not present

## 2022-04-27 DIAGNOSIS — R079 Chest pain, unspecified: Secondary | ICD-10-CM | POA: Diagnosis not present

## 2022-05-03 DIAGNOSIS — R9431 Abnormal electrocardiogram [ECG] [EKG]: Secondary | ICD-10-CM | POA: Diagnosis not present

## 2022-05-03 DIAGNOSIS — R079 Chest pain, unspecified: Secondary | ICD-10-CM | POA: Diagnosis not present

## 2022-05-06 ENCOUNTER — Other Ambulatory Visit: Payer: Self-pay | Admitting: Family Medicine

## 2022-05-06 DIAGNOSIS — E559 Vitamin D deficiency, unspecified: Secondary | ICD-10-CM

## 2022-06-14 NOTE — Progress Notes (Unsigned)
Name: Frederick Nelson   MRN: [id number]    DOB: Nov 29, 1950   Date:06/15/2022       Progress Note  Subjective  Chief Complaint  Annual Exam  HPI  Patient presents for annual CPE.  IPSS Questionnaire (AUA-7): Over the past month.   1)  How often have you had a sensation of not emptying your bladder completely after you finish urinating?  0 - Not at all  2)  How often have you had to urinate again less than two hours after you finished urinating? 1 - Less than 1 time in 5  3)  How often have you found you stopped and started again several times when you urinated?  0 - Not at all  4) How difficult have you found it to postpone urination?  2 - Less than half the time  5) How often have you had a weak urinary stream?  0 - Not at all  6) How often have you had to push or strain to begin urination?  0 - Not at all  7) How many times did you most typically get up to urinate from the time you went to bed until the time you got up in the morning?  3 - 3 times  Total score:  0-7 mildly symptomatic   8-19 moderately symptomatic   20-35 severely symptomatic     Diet: he likes bread, he has been eating more chicken and avoiding pork and red meat, high in vegetables.  Exercise: discussed increase in physical activity  Last Dental Exam: due for follow up Last Eye Exam: due for eye exam  Depression: phq 9 is negative    06/15/2022   10:22 AM 04/14/2022    8:23 AM 01/25/2022   11:10 AM 10/13/2021   10:34 AM 07/28/2021    9:29 AM  Depression screen PHQ 2/9  Decreased Interest 0 0 0 0 0  Down, Depressed, Hopeless 0 0 0 0 0  PHQ - 2 Score 0 0 0 0 0  Altered sleeping 3 0 0 0   Tired, decreased energy 0 0 1 0   Change in appetite 0 0 0 0   Feeling bad or failure about yourself  0 0 0 0   Trouble concentrating 1 0 0 0   Moving slowly or fidgety/restless 0 0 0 0   Suicidal thoughts 0 0 0 0   PHQ-9 Score 4 0 1 0   Difficult doing work/chores   Not difficult at all      Hypertension:  BP  Readings from Last 3 Encounters:  06/15/22 122/72  04/14/22 126/70  01/25/22 128/72    Obesity: Wt Readings from Last 3 Encounters:  06/15/22 210 lb (95.3 kg)  04/14/22 206 lb 4.8 oz (93.6 kg)  01/25/22 209 lb 1.6 oz (94.8 kg)   BMI Readings from Last 3 Encounters:  06/15/22 29.29 kg/m  04/14/22 28.77 kg/m  01/25/22 29.16 kg/m     Lipids:  Lab Results  Component Value Date   CHOL 112 04/14/2022   CHOL 190 01/17/2022   CHOL 197 04/29/2021   Lab Results  Component Value Date   HDL 39 (L) 04/14/2022   HDL 50 01/17/2022   HDL 50 04/29/2021   Lab Results  Component Value Date   LDLCALC 56 04/14/2022   LDLCALC 110 (H) 01/17/2022   LDLCALC 128 (H) 04/29/2021   Lab Results  Component Value Date   TRIG 86 04/14/2022   TRIG 148 01/17/2022  TRIG 93 04/29/2021   Lab Results  Component Value Date   CHOLHDL 2.9 04/14/2022   CHOLHDL 3.8 01/17/2022   CHOLHDL 3.9 04/29/2021   No results found for: "LDLDIRECT" Glucose:  Glucose, Bld  Date Value Ref Range Status  04/12/2022 111 (H) 70 - 99 mg/dL Final    Comment:    Glucose reference range applies only to samples taken after fasting for at least 8 hours.  01/25/2022 102 (H) 65 - 99 mg/dL Final    Comment:    .            Fasting reference interval . For someone without known diabetes, a glucose value between 100 and 125 mg/dL is consistent with prediabetes and should be confirmed with a follow-up test. .   01/17/2022 114 (H) 70 - 99 mg/dL Final    Comment:    Glucose reference range applies only to samples taken after fasting for at least 8 hours.   Glucose-Capillary  Date Value Ref Range Status  01/16/2022 116 (H) 70 - 99 mg/dL Final    Comment:    Glucose reference range applies only to samples taken after fasting for at least 8 hours.    Flowsheet Row Clinical Support from 07/28/2021 in [facility]  AUDIT-C Score 1      Divorced STD testing and prevention  (HIV/chl/gon/syphilis): 05/29/17 Sexual history: same partner for over 10 years . He has ED, he tried injections but caused during intercourse, viagra works but not a full erection Hep C Screening: up to date  Skin cancer: Discussed monitoring for atypical lesions Colorectal cancer: 09/01/20 Prostate cancer:   Lab Results  Component Value Date   PSA 1.25 04/29/2021   PSA 0.9 07/02/2018   PSA 1.1 05/29/2017     Lung cancer:  Low Dose CT Chest recommended if Age 40-80 years, 30 pack-year currently smoking OR have quit w/in 15years. Patient  no a candidate for screening   AAA: The USPSTF recommends one-time screening with ultrasonography in men ages 47 to 60 years who have ever smoked. Patient   no, a candidate for screening  ECG:  04/12/22  Vaccines:   RSV: he will go to pharmacy  Tdap: up to date Shingrix: he will have it at the pharmacy  Pneumonia: up to date Flu: today  COVID-19: discussed with patient   Advanced Care Planning: A voluntary discussion about advance care planning including the explanation and discussion of advance directives.  Discussed health care proxy and Living will, and the patient was able to identify a health care proxy as  .  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 The tumor was 2.6 cm,low grade with zero of 14 nodes positive. No evidence of venous or lymphatic invasion   COLONOSCOPY  [id number]   COLONOSCOPY WITH PROPOFOL N/A 08/04/2015   Procedure: COLONOSCOPY WITH PROPOFOL;  Surgeon: [name], MD;  Location: [facility] ENDOSCOPY;  Service: Endoscopy;  Laterality: N/A;   COLONOSCOPY WITH PROPOFOL N/A 09/01/2020   Procedure: COLONOSCOPY WITH PROPOFOL;  Surgeon: [name], MD;  Location: [facility] ENDOSCOPY;  Service: Endoscopy;  Laterality: N/A;    Family History  Problem Relation Age of Onset   Cancer Brother        cancer of the thymus   Diabetes Mother    Hypertension Father    Stroke Father    Cancer Maternal Aunt        breast cancer    Social History   Socioeconomic History   Marital status: Divorced    Spouse name: Not on file   Number of children: 1   Years of education: 12   Highest education level: Some college, no degree  Occupational History   Not on file  Tobacco Use   Smoking status: Never   Smokeless tobacco: Never  Vaping Use   Vaping Use: Never used  Substance and Sexual Activity   Alcohol use: Yes    Alcohol/week: 1.0 - 2.0 standard drink of alcohol    Types: 1 - 2 Standard drinks or equivalent per week    Comment: Socially only - Very little   Drug use: No   Sexual activity: Yes    Birth control/protection: None  Other Topics Concern   Not on file  Social History Narrative   Pt lives alone; lives in [place]; worked in Scientist, product/process development; retd [employer]. Non-smokers; Hx of marijuana smoking- when young; rare alcohol.    Social Determinants of Health   Financial Resource Strain: Low Risk  (06/15/2022)   Overall Financial Resource Strain (CARDIA)    Difficulty of Paying Living Expenses: Not hard  at all  Food Insecurity: No Food Insecurity (06/15/2022)   Hunger Vital Sign    Worried About Running Out of Food in the Last Year: Never true    Ran Out of Food in the Last Year: Never true  Transportation Needs: No Transportation Needs (06/15/2022)   PRAPARE - Hydrologist (Medical): No    Lack of Transportation (Non-Medical): No  Physical Activity: Insufficiently Active (06/15/2022)   Exercise Vital Sign    Days of Exercise per Week: 1 day    Minutes of Exercise per Session: 20 min  Stress: Stress Concern Present (06/15/2022)   [place]    Feeling of Stress : To some extent  Social Connections: Moderately Isolated (06/15/2022)   Social Connection and Isolation Panel [NHANES]    Frequency of Communication with Friends and Family: More than three times a week    Frequency of Social Gatherings with Friends and Family: Twice a week    Attends Religious Services: Never    Marine scientist or Organizations: Yes    Attends Music therapist: More than 4 times per year    Marital Status: Divorced  Intimate Partner Violence: Not At Risk (06/15/2022)   Humiliation, Afraid, Rape, and Kick questionnaire    Fear of Current or Ex-Partner: No    Emotionally Abused: No    Physically Abused: No    Sexually Abused: No  Current Outpatient Medications:    AMBULATORY NON FORMULARY MEDICATION, Prostaglandin 10  Test Dose  77m vial   Qty #3 Refills 0  [phone] [phone] Disp: 3 mL, Rfl: 0   Ascorbic Acid (VITA-C PO), Take 1 tablet by mouth daily., Disp: , Rfl:    aspirin 81 MG chewable tablet, Chew 1 tablet (81 mg total) by mouth daily., Disp: 30 tablet, Rfl: 11   atorvastatin (LIPITOR) 20 MG tablet, Take 1 tablet (20 mg total) by mouth daily., Disp: 90 tablet, Rfl: 0   clopidogrel (PLAVIX) 75 MG tablet, Take 1 tablet (75 mg total) by mouth daily.,  Disp: 90 tablet, Rfl: 1   Flaxseed, Linseed, (FLAXSEED OIL) 1000 MG CAPS, Take 1 capsule by mouth daily., Disp: , Rfl:    loratadine (CLARITIN) 10 MG tablet, TAKE 1 TABLET BY MOUTH EVERY DAY, Disp: 90 tablet, Rfl: 1   sildenafil (VIAGRA) 100 MG tablet, Take 0.5-1 tablets (50-100 mg total) by mouth daily as needed for erectile dysfunction., Disp: 30 tablet, Rfl: 0   tamsulosin (FLOMAX) 0.4 MG CAPS capsule, TAKE 1 CAPSULE BY MOUTH EVERY DAY, Disp: 90 capsule, Rfl: 0   valACYclovir (VALTREX) 1000 MG tablet, Take 1 tablet (1,000 mg total) by mouth daily., Disp: 90 tablet, Rfl: 3   valsartan (DIOVAN) 160 MG tablet, Take 1 tablet (160 mg total) by mouth daily., Disp: 90 tablet, Rfl: 0   Vitamin D, Ergocalciferol, (DRISDOL) 1.25 MG (50000 UNIT) CAPS capsule, TAKE 1 CAPSULE (50,000 UNITS TOTAL) BY MOUTH EVERY 7 (SEVEN) DAYS, Disp: 12 capsule, Rfl: 1  Allergies  Allergen Reactions   Ace Inhibitors Swelling   Ciprofloxacin     Tendon damage     ROS  Constitutional: Negative for fever or weight change.  Respiratory: positive for a productive cough  (mostly dry now) since he had an URI - but getting better gradually in September but no shortness of breath.   Cardiovascular: Negative for chest pain or palpitations.  Gastrointestinal: Negative for abdominal pain, no bowel changes.  Musculoskeletal: Negative for gait problem or joint swelling.  Skin: Negative for rash.  Neurological: Negative for dizziness or headache.  No other specific complaints in a complete review of systems (except as listed in HPI above).    Objective  Vitals:   06/15/22 1033 06/15/22 1041  BP: 122/68 122/72  Pulse: 67   Resp: 16   SpO2: 99%   Weight: 210 lb (95.3 kg)   Height: '5\' 11"'$  (1.803 m)     Body mass index is 29.29 kg/m.  Physical Exam  Constitutional: Patient appears well-developed and well-nourished. No distress.  HENT: Head: Normocephalic and atraumatic. Ears: B TMs ok, no erythema or effusion; Nose:  Nose normal. Mouth/Throat: Oropharynx is clear and moist. No oropharyngeal exudate.  Eyes: Conjunctivae and EOM are normal. Pupils are equal, round, and reactive to light. No scleral icterus.  Neck: Normal range of motion. Neck supple. No JVD present. No thyromegaly present.  Cardiovascular: Normal rate, regular rhythm and normal heart sounds.  No murmur heard. No BLE edema. Pulmonary/Chest: Effort normal and breath sounds normal. No respiratory distress. Abdominal: Soft. Bowel sounds are normal, no distension. There is no tenderness. no masses MALE GENITALIA: Normal descended testes bilaterally, no masses palpated, no hernias, no lesions, no discharge  RECTAL: Prostate is enlarged in size  no rectal masses , skin tags present  Musculoskeletal: Normal range of motion, no joint effusions. No gross deformities Neurological: he is alert and oriented to person,  place, and time. No cranial nerve deficit. Coordination, balance, strength, speech and gait are normal.  Skin: Skin is warm and dry. No rash noted. No erythema.  Psychiatric: Patient has a normal mood and affect. behavior is normal. Judgment and thought content normal.   Recent Results (from the past 2160 hour(s))  Basic metabolic panel     Status: Abnormal   Collection Time: 04/12/22  3:32 PM  Result Value Ref Range   Sodium 142 135 - 145 mmol/L   Potassium 3.8 3.5 - 5.1 mmol/L   Chloride 106 98 - 111 mmol/L   CO2 28 22 - 32 mmol/L   Glucose, Bld 111 (H) 70 - 99 mg/dL    Comment: Glucose reference range applies only to samples taken after fasting for at least 8 hours.   BUN 26 (H) 8 - 23 mg/dL   Creatinine, Ser 1.82 (H) 0.61 - 1.24 mg/dL   Calcium 9.1 8.9 - 10.3 mg/dL   GFR, Estimated 39 (L) >60 mL/min    Comment: (NOTE) Calculated using the CKD-EPI Creatinine Equation (2021)    Anion gap 8 5 - 15    Comment: Performed at [facility], [address] [place], [place] [address]  CBC     Status: None   Collection Time:  04/12/22  3:32 PM  Result Value Ref Range   WBC 5.4 4.0 - 10.5 K/uL   RBC 4.63 4.22 - 5.81 MIL/uL   Hemoglobin 14.1 13.0 - 17.0 g/dL   HCT 43.3 39.0 - 52.0 %   MCV 93.5 80.0 - 100.0 fL   MCH 30.5 26.0 - 34.0 pg   MCHC 32.6 30.0 - 36.0 g/dL   RDW 12.4 11.5 - 15.5 %   Platelets 211 150 - 400 K/uL   nRBC 0.0 0.0 - 0.2 %    Comment: Performed at [facility], [place], [place] [address]  Troponin I (High Sensitivity)     Status: None   Collection Time: 04/12/22  3:41 PM  Result Value Ref Range   Troponin I (High Sensitivity) 5 <18 ng/L    Comment: (NOTE) Elevated high sensitivity troponin I (hsTnI) values and significant  changes across serial measurements may suggest ACS but many other  chronic and acute conditions are known to elevate hsTnI results.  Refer to the "Links" section for chest pain algorithms and additional  guidance. Performed at [facility], [address] [place], [place] [address]   Lipid Profile     Status: Abnormal   Collection Time: 04/14/22  8:55 AM  Result Value Ref Range   Cholesterol 112 <200 mg/dL   HDL 39 (L) > OR = 40 mg/dL   Triglycerides 86 <150 mg/dL   LDL Cholesterol (Calc) 56 mg/dL (calc)    Comment: Reference range: <100 . Desirable range <100 mg/dL for primary prevention;   <70 mg/dL for patients with CHD or diabetic patients  with > or = 2 CHD risk factors. Marland Kitchen LDL-C is now calculated using the Martin-Hopkins  calculation, which is a validated novel method providing  better accuracy than the Friedewald equation in the  estimation of LDL-C.  Cresenciano Genre et al. Annamaria Helling. 0973;532(99): 2061-2068  (http://education.[facility].com/faq/FAQ164)    Total CHOL/HDL Ratio 2.9 <5.0 (calc)   Non-HDL Cholesterol (Calc) 73 <130 mg/dL (calc)    Comment: For patients with diabetes plus 1 major ASCVD risk  factor, treating to a non-HDL-C goal of <100 mg/dL  (LDL-C of <70 mg/dL) is considered a therapeutic  option.    Hepatic function panel     Status: None   Collection Time: 04/14/22  8:55 AM  Result Value Ref Range   Total Protein 7.0 6.1 - 8.1 g/dL   Albumin 4.0 3.6 - 5.1 g/dL   Globulin 3.0 1.9 - 3.7 g/dL (calc)   AG Ratio 1.3 1.0 - 2.5 (calc)   Total Bilirubin 0.6 0.2 - 1.2 mg/dL   Bilirubin, Direct 0.1 0.0 - 0.2 mg/dL   Indirect Bilirubin 0.5 0.2 - 1.2 mg/dL (calc)   Alkaline phosphatase (APISO) 62 35 - 144 U/L   AST 22 10 - 35 U/L   ALT 29 9 - 46 U/L  TEST AUTHORIZATION     Status: None   Collection Time: 04/14/22  8:55 AM  Result Value Ref Range   TEST NAME: HEPATIC FUNCTION PANEL    TEST CODE: [id number]    CLIENT CONTACT: LISLEY SMITH    REPORT ALWAYS MESSAGE SIGNATURE      Comment: . The laboratory testing on this patient was verbally requested or confirmed by the ordering physician or his or her authorized representative after contact with an employee of [employer]. Federal regulations require that we maintain on file written authorization for all laboratory testing.  Accordingly we are asking that the ordering physician or his or her authorized representative sign a copy of this report and promptly return it to the client service representative. . . Signature:____________________________________________________ . Please fax this signed page to [phone] or return it via your [employer] courier.      Fall Risk:    06/15/2022   10:22 AM 04/14/2022    8:23 AM 01/25/2022   11:10 AM 10/13/2021   10:34 AM 07/28/2021    9:32 AM  Fall Risk   Falls in the past year? 0 0 0 0 0  Number falls in past yr: 0   0 0  Injury with Fall? 0   0 0  Risk for fall due to : No Fall Risks No Fall Risks  No Fall Risks No Fall Risks  Follow up Falls prevention discussed Falls prevention discussed Falls prevention discussed;Education provided;Falls evaluation completed Falls prevention discussed Falls prevention discussed     Functional Status Survey: Is the patient deaf or  have difficulty hearing?: Yes Does the patient have difficulty seeing, even when wearing glasses/contacts?: No Does the patient have difficulty concentrating, remembering, or making decisions?: No Does the patient have difficulty walking or climbing stairs?: No Does the patient have difficulty dressing or bathing?: No Does the patient have difficulty doing errands alone such as visiting a doctor's office or shopping?: No    Assessment & Plan  1. Well adult exam   2. Need for immunization against influenza  - Flu Vaccine QUAD High Dose(Fluad)  3. Stage 3b chronic kidney disease (HCC)  - BASIC METABOLIC PANEL WITH GFR  4. Vitamin D deficiency  - VITAMIN D 25 Hydroxy (Vit-D Deficiency, Fractures)  5. BPH with obstruction/lower urinary tract symptoms  - PSA     -Prostate cancer screening and PSA options (with potential risks and benefits of testing vs not testing) were discussed along with recent recs/guidelines. -USPSTF grade A and B recommendations reviewed with patient; age-appropriate recommendations, preventive care, screening tests, etc discussed and encouraged; healthy living encouraged; see AVS for patient education given to patient -Discussed importance of 150 minutes of physical activity weekly, eat two servings of fish weekly, eat one serving of tree nuts ( cashews, pistachios, pecans, almonds.Marland Kitchen) every  other day, eat 6 servings of fruit/vegetables daily and drink plenty of water and avoid sweet beverages.  -Reviewed Health Maintenance: yes

## 2022-06-14 NOTE — Patient Instructions (Incomplete)
You need:  Shingrix COVID-19 RSV Tdap At the local pharmacy    Preventive Care 108 Years and Older, Male Preventive care refers to lifestyle choices and visits with your health care provider that can promote health and wellness. Preventive care visits are also called wellness exams. What can I expect for my preventive care visit? Counseling During your preventive care visit, your health care provider may ask about your: Medical history, including: Past medical problems. Family medical history. History of falls. Current health, including: Emotional well-being. Home life and relationship well-being. Sexual activity. Memory and ability to understand (cognition). Lifestyle, including: Alcohol, nicotine or tobacco, and drug use. Access to firearms. Diet, exercise, and sleep habits. Work and work Statistician. Sunscreen use. Safety issues such as seatbelt and bike helmet use. Physical exam Your health care provider will check your: Height and weight. These may be used to calculate your BMI (body mass index). BMI is a measurement that tells if you are at a healthy weight. Waist circumference. This measures the distance around your waistline. This measurement also tells if you are at a healthy weight and may help predict your risk of certain diseases, such as type 2 diabetes and high blood pressure. Heart rate and blood pressure. Body temperature. Skin for abnormal spots. What immunizations do I need?  Vaccines are usually given at various ages, according to a schedule. Your health care provider will recommend vaccines for you based on your age, medical history, and lifestyle or other factors, such as travel or where you work. What tests do I need? Screening Your health care provider may recommend screening tests for certain conditions. This may include: Lipid and cholesterol levels. Diabetes screening. This is done by checking your blood sugar (glucose) after you have not eaten for  a while (fasting). Hepatitis C test. Hepatitis B test. HIV (human immunodeficiency virus) test. STI (sexually transmitted infection) testing, if you are at risk. Lung cancer screening. Colorectal cancer screening. Prostate cancer screening. Abdominal aortic aneurysm (AAA) screening. You may need this if you are a current or former smoker. Talk with your health care provider about your test results, treatment options, and if necessary, the need for more tests. Follow these instructions at home: Eating and drinking  Eat a diet that includes fresh fruits and vegetables, whole grains, lean protein, and low-fat dairy products. Limit your intake of foods with high amounts of sugar, saturated fats, and salt. Take vitamin and mineral supplements as recommended by your health care provider. Do not drink alcohol if your health care provider tells you not to drink. If you drink alcohol: Limit how much you have to 0-2 drinks a day. Know how much alcohol is in your drink. In the [place] one drink equals one 12 oz bottle of beer (355 mL), one 5 oz glass of wine (148 mL), or one 1 oz glass of hard liquor (44 mL). Lifestyle Brush your teeth every morning and night with fluoride toothpaste. Floss one time each day. Exercise for at least 30 minutes 5 or more days each week. Do not use any products that contain nicotine or tobacco. These products include cigarettes, chewing tobacco, and vaping devices, such as e-cigarettes. If you need help quitting, ask your health care provider. Do not use drugs. If you are sexually active, practice safe sex. Use a condom or other form of protection to prevent STIs. Take aspirin only as told by your health care provider. Make sure that you understand how much to take and what form to  take. Work with your health care provider to find out whether it is safe and beneficial for you to take aspirin daily. Ask your health care provider if you need to take a cholesterol-lowering  medicine (statin). Find healthy ways to manage stress, such as: Meditation, yoga, or listening to music. Journaling. Talking to a trusted person. Spending time with friends and family. Safety Always wear your seat belt while driving or riding in a vehicle. Do not drive: If you have been drinking alcohol. Do not ride with someone who has been drinking. When you are tired or distracted. While texting. If you have been using any mind-altering substances or drugs. Wear a helmet and other protective equipment during sports activities. If you have firearms in your house, make sure you follow all gun safety procedures. Minimize exposure to UV radiation to reduce your risk of skin cancer. What's next? Visit your health care provider once a year for an annual wellness visit. Ask your health care provider how often you should have your eyes and teeth checked. Stay up to date on all vaccines. This information is not intended to replace advice given to you by your health care provider. Make sure you discuss any questions you have with your health care provider. Document Revised: 02/09/2021 Document Reviewed: 02/09/2021 Elsevier Patient Education  [place].

## 2022-06-15 ENCOUNTER — Ambulatory Visit (INDEPENDENT_AMBULATORY_CARE_PROVIDER_SITE_OTHER): Payer: Medicare HMO | Admitting: Family Medicine

## 2022-06-15 ENCOUNTER — Encounter: Payer: Self-pay | Admitting: Family Medicine

## 2022-06-15 ENCOUNTER — Other Ambulatory Visit: Payer: Self-pay

## 2022-06-15 VITALS — BP 122/72 | HR 67 | Resp 16 | Ht 71.0 in | Wt 210.0 lb

## 2022-06-15 DIAGNOSIS — N1832 Chronic kidney disease, stage 3b: Secondary | ICD-10-CM | POA: Diagnosis not present

## 2022-06-15 DIAGNOSIS — E559 Vitamin D deficiency, unspecified: Secondary | ICD-10-CM

## 2022-06-15 DIAGNOSIS — N138 Other obstructive and reflux uropathy: Secondary | ICD-10-CM

## 2022-06-15 DIAGNOSIS — Z23 Encounter for immunization: Secondary | ICD-10-CM | POA: Diagnosis not present

## 2022-06-15 DIAGNOSIS — Z0000 Encounter for general adult medical examination without abnormal findings: Secondary | ICD-10-CM

## 2022-06-15 DIAGNOSIS — N401 Enlarged prostate with lower urinary tract symptoms: Secondary | ICD-10-CM

## 2022-06-15 MED ORDER — TAMSULOSIN HCL 0.4 MG PO CAPS: 0.4000 mg | ORAL_CAPSULE | Freq: Every day | ORAL | 0 refills | Status: DC

## 2022-06-15 MED ORDER — SUVOREXANT 10 MG PO TABS: 1.0000 | ORAL_TABLET | Freq: Every day | ORAL | 0 refills | Status: DC

## 2022-06-16 LAB — BASIC METABOLIC PANEL WITH GFR
BUN/Creatinine Ratio: 17 (calc) (ref 6–22)
BUN: 24 mg/dL (ref 7–25)
CO2: 27 mmol/L (ref 20–32)
Calcium: 9.2 mg/dL (ref 8.6–10.3)
Chloride: 106 mmol/L (ref 98–110)
Creat: 1.44 mg/dL — ABNORMAL HIGH (ref 0.70–1.28)
Glucose, Bld: 87 mg/dL (ref 65–99)
Potassium: 4.7 mmol/L (ref 3.5–5.3)
Sodium: 141 mmol/L (ref 135–146)
eGFR: 52 mL/min/{1.73_m2} — ABNORMAL LOW (ref >=60)

## 2022-06-16 LAB — VITAMIN D 25 HYDROXY (VIT D DEFICIENCY, FRACTURES): Vit D, 25-Hydroxy: 54 ng/mL (ref 30–100)

## 2022-06-16 LAB — PSA: PSA: 1.29 ng/mL (ref <=4.00)

## 2022-07-04 DIAGNOSIS — M25562 Pain in left knee: Secondary | ICD-10-CM | POA: Diagnosis not present

## 2022-07-04 DIAGNOSIS — M2392 Unspecified internal derangement of left knee: Secondary | ICD-10-CM | POA: Diagnosis not present

## 2022-07-05 ENCOUNTER — Other Ambulatory Visit: Payer: Self-pay | Admitting: Physician Assistant

## 2022-07-05 DIAGNOSIS — M2392 Unspecified internal derangement of left knee: Secondary | ICD-10-CM

## 2022-07-05 DIAGNOSIS — M25562 Pain in left knee: Secondary | ICD-10-CM

## 2022-07-11 ENCOUNTER — Ambulatory Visit
Admission: RE | Admit: 2022-07-11 | Discharge: 2022-07-11 | Disposition: A | Discharge status: Home / Self Care | Payer: Medicare HMO | Source: Ambulatory Visit | Attending: Physician Assistant | Admitting: Physician Assistant

## 2022-07-11 DIAGNOSIS — M25562 Pain in left knee: Secondary | ICD-10-CM

## 2022-07-11 DIAGNOSIS — M2392 Unspecified internal derangement of left knee: Secondary | ICD-10-CM

## 2022-07-15 ENCOUNTER — Other Ambulatory Visit: Payer: Self-pay | Admitting: Family Medicine

## 2022-07-17 ENCOUNTER — Other Ambulatory Visit: Payer: Medicare HMO

## 2022-07-31 DIAGNOSIS — S83242A Other tear of medial meniscus, current injury, left knee, initial encounter: Secondary | ICD-10-CM | POA: Diagnosis not present

## 2022-08-01 ENCOUNTER — Ambulatory Visit (INDEPENDENT_AMBULATORY_CARE_PROVIDER_SITE_OTHER): Payer: Medicare HMO

## 2022-08-01 VITALS — Ht 71.0 in | Wt 210.0 lb

## 2022-08-01 DIAGNOSIS — Z0000 Encounter for general adult medical examination without abnormal findings: Secondary | ICD-10-CM

## 2022-08-01 NOTE — Progress Notes (Addendum)
Subjective:   I connected with  Frederick Nelson on 08/01/22 by a audio enabled telemedicine application and verified that I am speaking with the correct person using two identifiers.  Patient Location: Home  Provider Location: Office/Clinic  I discussed the limitations of evaluation and management by telemedicine. The patient expressed understanding and agreed to proceed. Frederick Nelson is a 71 y.o. male who presents for Medicare Annual/Subsequent preventive examination.  Review of Systems    Defer to PCP       Objective:    There were no vitals filed for this visit. There is no height or weight on file to calculate BMI.     04/12/2022    3:33 PM 01/17/2022    9:00 AM 01/16/2022    8:51 PM 07/28/2021    9:30 AM 09/01/2020    8:14 AM 07/27/2020    9:50 AM 07/18/2019    9:36 AM  Advanced Directives  Does Patient Have a Medical Advance Directive? No No No No No No No  Would patient like information on creating a medical advance directive?  No - Patient declined  Yes (MAU/Ambulatory/Procedural Areas - Information given) No - Patient declined No - Patient declined Yes (MAU/Ambulatory/Procedural Areas - Information given)    Current Medications (verified) Outpatient Encounter Medications as of 08/01/2022  Medication Sig   AMBULATORY NON FORMULARY MEDICATION Prostaglandin 10  Test Dose  75m vial   Qty #3 Refills 0  [phone] [phone]  Ascorbic Acid (VITA-C PO) Take 1 tablet by mouth daily.   aspirin 81 MG chewable tablet Chew 1 tablet (81 mg total) by mouth daily.   atorvastatin (LIPITOR) 20 MG tablet TAKE 1 TABLET BY MOUTH EVERY DAY   clopidogrel (PLAVIX) 75 MG tablet Take 1 tablet (75 mg total) by mouth daily.   Flaxseed, Linseed, (FLAXSEED OIL) 1000 MG CAPS Take 1 capsule by mouth daily.   loratadine (CLARITIN) 10 MG tablet TAKE 1 TABLET BY MOUTH EVERY DAY   sildenafil (VIAGRA) 100 MG tablet Take 0.5-1 tablets (50-100 mg total) by  mouth daily as needed for erectile dysfunction.   Suvorexant 10 MG TABS Take 1 tablet by mouth daily.   tamsulosin (FLOMAX) 0.4 MG CAPS capsule Take 1 capsule (0.4 mg total) by mouth daily.   valACYclovir (VALTREX) 1000 MG tablet Take 1 tablet (1,000 mg total) by mouth daily.   valsartan (DIOVAN) 160 MG tablet Take 1 tablet (160 mg total) by mouth daily.   Vitamin D, Ergocalciferol, (DRISDOL) 1.25 MG (50000 UNIT) CAPS capsule TAKE 1 CAPSULE (50,000 UNITS TOTAL) BY MOUTH EVERY 7 (SEVEN) DAYS   No facility-administered encounter medications on file as of 08/01/2022.    Allergies (verified) Ace inhibitors and Ciprofloxacin   History: Past Medical History:  Diagnosis Date   Allergy    Arthritis    Benign hypertension with chronic kidney disease, stage III (HCC)    Chronic low back pain    CKD (chronic kidney disease), stage III (HCC)    Degenerative joint disease (DJD) of hip    Genital herpes    Takes Valtrex for prevention   GERD (gastroesophageal reflux disease) 1995   Hyperlipidemia    Hypertension    [place]   Special screening for malignant neoplasms, colon 2011   St[facility] ([facility]    Past Surgical History:  Procedure Laterality Date   COLON SURGERY  06/26/2007   right hemicolectomy-T2N0 carcinoma of right colon. The tumor was  2.6 cm,low grade with zero of 14 nodes positive. No evidence of venous or lymphatic invasion   COLONOSCOPY  [id number]   COLONOSCOPY WITH PROPOFOL N/A 08/04/2015   Procedure: COLONOSCOPY WITH PROPOFOL;  Surgeon: [name], MD;  Location: [facility] ENDOSCOPY;  Service: Endoscopy;  Laterality: N/A;   COLONOSCOPY WITH PROPOFOL N/A 09/01/2020   Procedure: COLONOSCOPY WITH PROPOFOL;  Surgeon: [name], MD;  Location: [facility] ENDOSCOPY;  Service: Endoscopy;  Laterality: N/A;   Family History  Problem Relation Age of Onset   Cancer Brother        cancer of the thymus   Diabetes Mother    Hypertension Father     Stroke Father    Cancer Maternal Aunt        breast cancer   Social History   Socioeconomic History   Marital status: Divorced    Spouse name: Not on file   Number of children: 1   Years of education: 12   Highest education level: Some college, no degree  Occupational History   Not on file  Tobacco Use   Smoking status: Never   Smokeless tobacco: Never  Vaping Use   Vaping Use: Never used  Substance and Sexual Activity   Alcohol use: Yes    Alcohol/week: 1.0 - 2.0 standard drink of alcohol    Types: 1 - 2 Standard drinks or equivalent per week    Comment: Socially only - Very little   Drug use: No   Sexual activity: Yes    Birth control/protection: None  Other Topics Concern   Not on file  Social History Narrative   Pt lives alone; lives in [place]; worked in Scientist, product/process development; retd [employer]. Non-smokers; Hx of marijuana smoking- when young; rare alcohol.    Social Determinants of Health   Financial Resource Strain: Low Risk  (06/15/2022)   Overall Financial Resource Strain (CARDIA)    Difficulty of Paying Living Expenses: Not hard at all  Food Insecurity: No Food Insecurity (06/15/2022)   Hunger Vital Sign    Worried About Running Out of Food in the Last Year: Never true    Ran Out of Food in the Last Year: Never true  Transportation Needs: No Transportation Needs (06/15/2022)   PRAPARE - Hydrologist (Medical): No    Lack of Transportation (Non-Medical): No  Physical Activity: Insufficiently Active (06/15/2022)   Exercise Vital Sign    Days of Exercise per Week: 1 day    Minutes of Exercise per Session: 20 min  Stress: Stress Concern Present (06/15/2022)   [place]    Feeling of Stress : To some extent  Social Connections: Moderately Isolated (06/15/2022)   Social Connection and Isolation Panel [NHANES]    Frequency of Communication with Friends and Family: More  than three times a week    Frequency of Social Gatherings with Friends and Family: Twice a week    Attends Religious Services: Never    Marine scientist or Organizations: Yes    Attends Music therapist: More than 4 times per year    Marital Status: Divorced    Tobacco Counseling Counseling given: Not Answered   Clinical Intake:                 Diabetic?No         Activities of Daily Living    06/15/2022   10:22 AM 04/14/2022    8:23  AM  In your present state of health, do you have any difficulty performing the following activities:  Hearing? 1 1  Vision? 0 0  Difficulty concentrating or making decisions? 0 1  Walking or climbing stairs? 0 0  Dressing or bathing? 0 0  Doing errands, shopping? 0 0    Patient Care Team: Steele Sizer, MD as PCP - General (Family Medicine) Bary Castilla, Forest Gleason, MD as Consulting Physician (General Surgery) Anthonette Legato, MD (Nephrology) Cammie Sickle, MD as Consulting Physician (Internal Medicine)  Indicate any recent Medical Services you may have received from other than [facility] providers in the past year (date may be approximate).     Assessment:   This is a routine wellness examination for Aloysuis.  Hearing/Vision screen No results found.  Dietary issues and exercise activities discussed:     Goals Addressed   None    Depression Screen    06/15/2022   10:22 AM 04/14/2022    8:23 AM 01/25/2022   11:10 AM 10/13/2021   10:34 AM 07/28/2021    9:29 AM 04/29/2021    8:55 AM 04/15/2021    9:41 AM  PHQ 2/9 Scores  PHQ - 2 Score 0 0 0 0 0 0 0  PHQ- 9 Score 4 0 1 0       Fall Risk    06/15/2022   10:22 AM 04/14/2022    8:23 AM 01/25/2022   11:10 AM 10/13/2021   10:34 AM 07/28/2021    9:32 AM  Fall Risk   Falls in the past year? 0 0 0 0 0  Number falls in past yr: 0   0 0  Injury with Fall? 0   0 0  Risk for fall due to : No Fall Risks No Fall Risks  No Fall Risks No Fall Risks  Follow up  Falls prevention discussed Falls prevention discussed Falls prevention discussed;Education provided;Falls evaluation completed Falls prevention discussed Falls prevention discussed    FALL RISK PREVENTION PERTAINING TO THE HOME:  Any stairs in or around the home? Yes  If so, are there any without handrails? No  Home free of loose throw rugs in walkways, pet beds, electrical cords, etc? No  Adequate lighting in your home to reduce risk of falls? No   ASSISTIVE DEVICES UTILIZED TO PREVENT FALLS:  Life alert? No  Use of a cane, walker or w/c? No  Grab bars in the bathroom? Yes  Shower chair or bench in shower? No  Elevated toilet seat or a handicapped toilet? Yes   TIMED UP AND GO:  Was the test performed? No .  Length of time to ambulate 10 feet: n/a sec.     Cognitive Function:        07/27/2020    9:54 AM 07/18/2019    9:42 AM 05/29/2017   11:16 AM  6CIT Screen  What Year? 0 points 0 points 0 points  What month? 0 points 0 points 0 points  What time? 0 points 0 points 0 points  Count back from 20 0 points 0 points 0 points  Months in reverse 0 points 0 points 0 points  Repeat phrase 2 points 0 points 2 points  Total Score 2 points 0 points 2 points    Immunizations Immunization History  Administered Date(s) Administered   Fluad Quad(high Dose 65+) 07/27/2020, 04/29/2021, 06/15/2022   Influenza, High Dose Seasonal PF 05/29/2017, 06/27/2018, 06/25/2019   Influenza,inj,Quad PF,6+ Mos 06/18/2015, 05/11/2016   Influenza-Unspecified 06/25/2019   Moderna  Sars-Covid-2 Vaccination 10/06/2019, 11/05/2019   Pneumococcal Conjugate-13 05/29/2017   Pneumococcal Polysaccharide-23 07/02/2018   Tdap 01/31/2013    TDAP status: Up to date  Flu Vaccine status: Up to date  Pneumococcal vaccine status: Up to date  Covid-19 vaccine status: Completed vaccines  Qualifies for Shingles Vaccine? No   Zostavax completed No   Shingrix Completed?: No.    Education has been provided  regarding the importance of this vaccine. Patient has been advised to call insurance company to determine out of pocket expense if they have not yet received this vaccine. Advised may also receive vaccine at local pharmacy or Health Dept. Verbalized acceptance and understanding.  Screening Tests Health Maintenance  Topic Date Due   Zoster Vaccines- Shingrix (1 of 2) Never done   COVID-19 Vaccine (3 - 2023-24 season) 04/28/2022   Medicare Annual Wellness (AWV)  07/28/2022   DTaP/Tdap/Td (2 - Td or Tdap) 02/01/2023   COLONOSCOPY (Pts 45-77yr Insurance coverage will need to be confirmed)  09/01/2025   Pneumonia Vaccine 70 Years old  Completed   INFLUENZA VACCINE  Completed   Hepatitis C Screening  Completed   HPV VACCINES  Aged Out    Health Maintenance  Health Maintenance Due  Topic Date Due   Zoster Vaccines- Shingrix (1 of 2) Never done   COVID-19 Vaccine (3 - 2023-24 season) 04/28/2022   Medicare Annual Wellness (AWV)  07/28/2022    Colorectal cancer screening: Type of screening: Colonoscopy. Completed 09/01/2020. Repeat every 5 years  Lung Cancer Screening: (Low Dose CT Chest recommended if Age 70-80years, 30 pack-year currently smoking OR have quit w/in 15years.) does not qualify.   Lung Cancer Screening Referral: n/a  Additional Screening:  Hepatitis C Screening: does not qualify; Completed 05/29/2017  Vision Screening: Recommended annual ophthalmology exams for early detection of glaucoma and other disorders of the eye. Is the patient up to date with their annual eye exam?  Yes  Who is the provider or what is the name of the office in which the patient attends annual eye exams? New name, patient cannot recall at this time.  If pt is not established with a provider, would they like to be referred to a provider to establish care? No .   Dental Screening: Recommended annual dental exams for proper oral hygiene  Community Resource Referral / Chronic Care Management: CRR  required this visit?  No   CCM required this visit?  No      Plan:     I have personally reviewed and noted the following in the patient's chart:   Medical and social history Use of alcohol, tobacco or illicit drugs  Current medications and supplements including opioid prescriptions. Patient is not currently taking opioid prescriptions. Functional ability and status Nutritional status Physical activity Advanced directives List of other physicians Hospitalizations, surgeries, and ER visits in previous 12 months Vitals Screenings to include cognitive, depression, and falls Referrals and appointments  In addition, I have reviewed and discussed with patient certain preventive protocols, quality metrics, and best practice recommendations. A written personalized care plan for preventive services as well as general preventive health recommendations were provided to patient.     SRoyal Hawthorn CGaylord  08/01/2022   Nurse Notes:  Mr. MRobenson, Thank you for taking time to come for your Medicare Wellness Visit. I appreciate your ongoing commitment to your health goals. Please review the following plan we discussed and let me know if I can assist you in the future.  These are the goals we discussed:  Goals      Patient Stated     Pt would like to remain active and complete renovation on house he is planning to buy. 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         This is a list of the screening recommended for you and due dates:  Health Maintenance  Topic Date Due   COVID-19 Vaccine (3 - 2023-24 season) 08/17/2022*   Zoster (Shingles) Vaccine (1 of 2) 10/31/2022*   DTaP/Tdap/Td vaccine (2 - Td or Tdap) 02/01/2023   Medicare Annual Wellness Visit  08/02/2023   Colon Cancer Screening  09/01/2025   Pneumonia Vaccine  Completed   Flu Shot  Completed   Hepatitis C Screening: USPSTF Recommendation to screen - Ages 18-79 yo.  Completed   HPV Vaccine  Aged Out  *Topic was postponed.  The date shown is not the original due date.     I have reviewed this encounter including the documentation in this note and/or discussed this patient with the provider, Rexford Maus, CMA. I am certifying that I agree with the content of this note as supervising physician.  Steele Sizer, MD [facility] 08/10/2022, 11:19 AM

## 2022-08-02 DIAGNOSIS — G4733 Obstructive sleep apnea (adult) (pediatric): Secondary | ICD-10-CM | POA: Diagnosis not present

## 2022-08-02 DIAGNOSIS — R0602 Shortness of breath: Secondary | ICD-10-CM | POA: Diagnosis not present

## 2022-08-03 DIAGNOSIS — R0602 Shortness of breath: Secondary | ICD-10-CM | POA: Diagnosis not present

## 2022-08-03 DIAGNOSIS — G4733 Obstructive sleep apnea (adult) (pediatric): Secondary | ICD-10-CM | POA: Diagnosis not present

## 2022-08-06 ENCOUNTER — Other Ambulatory Visit: Payer: Self-pay | Admitting: Family Medicine

## 2022-08-06 DIAGNOSIS — J302 Other seasonal allergic rhinitis: Secondary | ICD-10-CM

## 2022-08-08 DIAGNOSIS — G4700 Insomnia, unspecified: Secondary | ICD-10-CM | POA: Diagnosis not present

## 2022-08-08 DIAGNOSIS — I10 Essential (primary) hypertension: Secondary | ICD-10-CM | POA: Diagnosis not present

## 2022-08-08 DIAGNOSIS — R32 Unspecified urinary incontinence: Secondary | ICD-10-CM | POA: Diagnosis not present

## 2022-08-08 DIAGNOSIS — M1990 Unspecified osteoarthritis, unspecified site: Secondary | ICD-10-CM | POA: Diagnosis not present

## 2022-08-08 DIAGNOSIS — N400 Enlarged prostate without lower urinary tract symptoms: Secondary | ICD-10-CM | POA: Diagnosis not present

## 2022-08-08 DIAGNOSIS — Z8673 Personal history of transient ischemic attack (TIA), and cerebral infarction without residual deficits: Secondary | ICD-10-CM | POA: Diagnosis not present

## 2022-08-08 DIAGNOSIS — Z7902 Long term (current) use of antithrombotics/antiplatelets: Secondary | ICD-10-CM | POA: Diagnosis not present

## 2022-08-08 DIAGNOSIS — J302 Other seasonal allergic rhinitis: Secondary | ICD-10-CM | POA: Diagnosis not present

## 2022-08-08 DIAGNOSIS — Z823 Family history of stroke: Secondary | ICD-10-CM | POA: Diagnosis not present

## 2022-08-08 DIAGNOSIS — Z8249 Family history of ischemic heart disease and other diseases of the circulatory system: Secondary | ICD-10-CM | POA: Diagnosis not present

## 2022-08-08 DIAGNOSIS — E785 Hyperlipidemia, unspecified: Secondary | ICD-10-CM | POA: Diagnosis not present

## 2022-08-08 DIAGNOSIS — Z008 Encounter for other general examination: Secondary | ICD-10-CM | POA: Diagnosis not present

## 2022-08-08 DIAGNOSIS — K219 Gastro-esophageal reflux disease without esophagitis: Secondary | ICD-10-CM | POA: Diagnosis not present

## 2022-08-22 ENCOUNTER — Encounter: Payer: Self-pay | Admitting: Family Medicine

## 2022-08-24 DIAGNOSIS — M25562 Pain in left knee: Secondary | ICD-10-CM | POA: Diagnosis not present

## 2022-08-24 DIAGNOSIS — G8929 Other chronic pain: Secondary | ICD-10-CM | POA: Diagnosis not present

## 2022-09-05 NOTE — Progress Notes (Unsigned)
Name: Frederick Nelson   MRN: [id number]    DOB: 13-May-1951   Date:09/05/2022       Progress Note  Subjective  Chief Complaint  Follow Up/ Elevated BP  HPI  Balance problems/ visual changes and also paresthesia: he has intermittent balance problems, also paresthesia - on and off - feels like burning or itching of skin anywhere on his body it lasts a few minutes per episode on and off intermittently. He also has intermittent visual disturbance , he states it got worse prior to the TIA back in May but since the event visual problems resolved. He has noticed increase in dizziness over the past couple of weeks.   Chest pain and diaphoresis: he worked in his yard on 08/16 after that he had lunch and noticed some left side chest discomfort but it was mild so he went to [employer]. At the store he felt dizzy followed by diaphoresis, increase in dizziness and nausea, he sat down and symptoms resolved but he went to to urgent care to be evaluated, from there he was sent to [name], had some labs and an EKG but he left due to the wait. Reviewed studies with patient. GFR dropped, troponin normal ( only one  ) and some changes noticed on EKG. Explained likely angina versus MI must see cardiologist, also explained needs to call 911 if recurrence of symptoms   Insomnia: he took Ambien for a while, followed by Trazodone and since August he is taking Seroquel, he states it is causing to feel sluggish and in a fog all day and affected his sex life, he is now on Belsomra 15 mg , initially it worked however noticing only lasting 4 hours, we increased the dose , he decided to stop because medication was helping her sleep at night but feel tired during the day. He was sleeping on his sofa and it helped with his sleep   HTN: he is on valsartan 160 mg daily and bp is at goal, he has noticed some intermittent dizziness and chest pain     CKI stage 3: : GFR  dropped again during his visit to [facility] two days ago. He saw  nephrologist, continue diovan    Bilateral hip pain: he has a long history of hip pain, he was  by [facility]. Dr. Ernst Spell  and had a couple of steroid injections on right hip, he states first injection worked better than second. He states pain is not as severe, he states symptoms worse at the end of the day, or when lifting objects. Pain is described as dull pain and usually 2/10 if normal activiies but goes up to 6-7 when more active . He had to stop playing golf because it aggravated symptoms. He was seen by Dr. Dorna Leitz of hips no significant arthritis noticed , he had MRI and diagnosed with mild L5 stenosis He went to PT and is feeling better   IMPRESSION: MRI done 09/2021  Chronic levoconvex lumbar scoliosis. But mild for age lumbar spine degeneration. No spinal stenosis or discrete disc herniation. No convincing right side neural impingement, with only mild bilateral foraminal stenosis at the L5 nerve levels.   ED: he takes Viagra prn, he denies side effects, discussed risk of hypotenstion with NTG. Viagra does not work well but did not like the penile pump or using penile injections. Seen by urologist and is also taking flomax now .    Gout attack: he is off HCTZ now, he has  intermittent stinging , he is taking prednisone prn  Herpes genitalis: he is taking prophylactic medication and no recent episodes . Unchanged   Pre-diabetes: he has a history of elevated glucose, last A1C was down to 5.9% at [facility]  Patient Active Problem List   Diagnosis Date Noted   Snoring 01/25/2022   Vertebrobasilar insufficiency 01/18/2022   TIA (transient ischemic attack) 01/17/2022   BPH (benign prostatic hyperplasia) 01/17/2022   Monoclonal gammopathy 07/13/2021   Chronic kidney disease 06/06/2021   Essential hypertension 06/06/2021   Sinus bradycardia 06/19/2018   Hyperlipidemia 11/02/2017   Herpes simplex infection of penis 11/01/2017   Erectile dysfunction 08/09/2016   Vitamin D deficiency  08/09/2016   Blurred vision, bilateral 04/27/2016   Degenerative joint disease (DJD) of hip 04/27/2016   Stage III chronic kidney disease ([place]) 01/26/2016   Chronic insomnia 01/26/2016   Bilateral leg edema 01/26/2016   History of colon cancer 06/10/2015    Past Surgical History:  Procedure Laterality Date   COLON SURGERY  06/26/2007   right hemicolectomy-T2N0 carcinoma of right colon. The tumor was 2.6 cm,low grade with zero of 14 nodes positive. No evidence of venous or lymphatic invasion   COLONOSCOPY  [id number]   COLONOSCOPY WITH PROPOFOL N/A 08/04/2015   Procedure: COLONOSCOPY WITH PROPOFOL;  Surgeon: [name], MD;  Location: [facility] ENDOSCOPY;  Service: Endoscopy;  Laterality: N/A;   COLONOSCOPY WITH PROPOFOL N/A 09/01/2020   Procedure: COLONOSCOPY WITH PROPOFOL;  Surgeon: [name], MD;  Location: [facility] ENDOSCOPY;  Service: Endoscopy;  Laterality: N/A;    Family History  Problem Relation Age of Onset   Cancer Brother        cancer of the thymus   Diabetes Mother    Hypertension Father    Stroke Father    Cancer Maternal Aunt        breast cancer    Social History   Tobacco Use   Smoking status: Never   Smokeless tobacco: Never  Substance Use Topics   Alcohol use: Yes    Alcohol/week: 1.0 - 2.0 standard drink of alcohol    Types: 1 - 2 Standard drinks or equivalent per week    Comment: Socially only - Very little     Current Outpatient Medications:    AMBULATORY NON FORMULARY MEDICATION, Prostaglandin 10  Test Dose  42m vial   Qty #3 [id number] [phone](Patient not taking: Reported on 08/01/2022), Disp: 3 mL, Rfl: 0   Ascorbic Acid (VITA-C PO), Take 1 tablet by mouth daily., Disp: , Rfl:    aspirin 81 MG chewable tablet, Chew 1 tablet (81 mg total) by mouth daily., Disp: 30 tablet, Rfl: 11   atorvastatin (LIPITOR) 20 MG tablet, TAKE 1 TABLET BY MOUTH EVERY DAY, Disp: 90 tablet, Rfl: 0   clopidogrel (PLAVIX)  75 MG tablet, Take 1 tablet (75 mg total) by mouth daily., Disp: 90 tablet, Rfl: 1   Flaxseed, Linseed, (FLAXSEED OIL) 1000 MG CAPS, Take 1 capsule by mouth daily., Disp: , Rfl:    loratadine (CLARITIN) 10 MG tablet, TAKE 1 TABLET BY MOUTH EVERY DAY, Disp: 90 tablet, Rfl: 1   sildenafil (VIAGRA) 100 MG tablet, Take 0.5-1 tablets (50-100 mg total) by mouth daily as needed for erectile dysfunction., Disp: 30 tablet, Rfl: 0   Suvorexant 10 MG TABS, Take 1 tablet by mouth daily., Disp: 90 tablet, Rfl: 0   tamsulosin (FLOMAX) 0.4 MG CAPS capsule, Take 1 capsule (0.4 mg  total) by mouth daily., Disp: 90 capsule, Rfl: 0   valACYclovir (VALTREX) 1000 MG tablet, Take 1 tablet (1,000 mg total) by mouth daily., Disp: 90 tablet, Rfl: 3   valsartan (DIOVAN) 160 MG tablet, Take 1 tablet (160 mg total) by mouth daily., Disp: 90 tablet, Rfl: 0   Vitamin D, Ergocalciferol, (DRISDOL) 1.25 MG (50000 UNIT) CAPS capsule, TAKE 1 CAPSULE (50,000 UNITS TOTAL) BY MOUTH EVERY 7 (SEVEN) DAYS, Disp: 12 capsule, Rfl: 1  Allergies  Allergen Reactions   Ace Inhibitors Swelling   Ciprofloxacin     Tendon damage    I personally reviewed active problem list, medication list, allergies, family history, social history, health maintenance with the patient/caregiver today.   ROS  ***  Objective  There were no vitals filed for this visit.  There is no height or weight on file to calculate BMI.  Physical Exam ***  Recent Results (from the past 2160 hour(s))  PSA     Status: None   Collection Time: 06/15/22 11:25 AM  Result Value Ref Range   PSA 1.29 < OR = 4.00 ng/mL    Comment: The total PSA value from this assay system is  standardized against the WHO standard. The test  result will be approximately 20% lower when compared  to the equimolar-standardized total PSA (Beckman  Coulter). Comparison of serial PSA results should be  interpreted with this fact in mind. . This test was performed using the Siemens   chemiluminescent method. Values obtained from  different assay methods cannot be used interchangeably. PSA levels, regardless of value, should not be interpreted as absolute evidence of the presence or absence of disease.   BASIC METABOLIC PANEL WITH GFR     Status: Abnormal   Collection Time: 06/15/22 11:25 AM  Result Value Ref Range   Glucose, Bld 87 65 - 99 mg/dL    Comment: .            Fasting reference interval .    BUN 24 7 - 25 mg/dL   Creat 1.44 (H) 0.70 - 1.28 mg/dL   eGFR 52 (L) > OR = 60 mL/min/1.33m   BUN/Creatinine Ratio 17 6 - 22 (calc)   Sodium 141 135 - 146 mmol/L   Potassium 4.7 3.5 - 5.3 mmol/L   Chloride 106 98 - 110 mmol/L   CO2 27 20 - 32 mmol/L   Calcium 9.2 8.6 - 10.3 mg/dL  VITAMIN D 25 Hydroxy (Vit-D Deficiency, Fractures)     Status: None   Collection Time: 06/15/22 11:25 AM  Result Value Ref Range   Vit D, 25-Hydroxy 54 30 - 100 ng/mL    Comment: Vitamin D Status         25-OH Vitamin D: . Deficiency:                    <20 ng/mL Insufficiency:             20 - 29 ng/mL Optimal:                 > or = 30 ng/mL . For 25-OH Vitamin D testing on patients on  D2-supplementation and patients for whom quantitation  of D2 and D3 fractions is required, the QuestAssureD(TM) 25-OH VIT D, (D2,D3), LC/MS/MS is recommended: order  code [id number](patients >224yr. . See Note 1 . Note 1 . For additional information, please refer to  http://education.QuestDiagnostics.com/faq/FAQ199  (This link is being provided for informational/ educational purposes only.)  PHQ2/9:    08/01/2022    9:10 AM 06/15/2022   10:22 AM 04/14/2022    8:23 AM 01/25/2022   11:10 AM 10/13/2021   10:34 AM  Depression screen PHQ 2/9  Decreased Interest 0 0 0 0 0  Down, Depressed, Hopeless 0 0 0 0 0  PHQ - 2 Score 0 0 0 0 0  Altered sleeping 1 3 0 0 0  Tired, decreased energy 1 0 0 1 0  Change in appetite 0 0 0 0 0  Feeling bad or failure about yourself  0 0 0 0 0   Trouble concentrating 1 1 0 0 0  Moving slowly or fidgety/restless 0 0 0 0 0  Suicidal thoughts 0 0 0 0 0  PHQ-9 Score 3 4 0 1 0  Difficult doing work/chores    Not difficult at all     phq 9 is {gen pos [id number]}   Fall Risk:    08/01/2022    9:09 AM 06/15/2022   10:22 AM 04/14/2022    8:23 AM 01/25/2022   11:10 AM 10/13/2021   10:34 AM  Fall Risk   Falls in the past year? 0 0 0 0 0  Number falls in past yr:  0   0  Injury with Fall?  0   0  Risk for fall due to : No Fall Risks No Fall Risks No Fall Risks  No Fall Risks  Follow up Falls prevention discussed;Education provided Falls prevention discussed Falls prevention discussed Falls prevention discussed;Education provided;Falls evaluation completed Falls prevention discussed      Functional Status Survey:      Assessment & Plan  *** There are no diagnoses linked to this encounter.

## 2022-09-06 ENCOUNTER — Encounter: Payer: Self-pay | Admitting: Family Medicine

## 2022-09-06 ENCOUNTER — Ambulatory Visit (INDEPENDENT_AMBULATORY_CARE_PROVIDER_SITE_OTHER): Payer: Medicare HMO | Admitting: Family Medicine

## 2022-09-06 VITALS — BP 140/72 | HR 72 | Resp 16 | Ht 71.0 in | Wt 205.0 lb

## 2022-09-06 DIAGNOSIS — G4733 Obstructive sleep apnea (adult) (pediatric): Secondary | ICD-10-CM | POA: Insufficient documentation

## 2022-09-06 DIAGNOSIS — Z8673 Personal history of transient ischemic attack (TIA), and cerebral infarction without residual deficits: Secondary | ICD-10-CM | POA: Diagnosis not present

## 2022-09-06 DIAGNOSIS — I10 Essential (primary) hypertension: Secondary | ICD-10-CM | POA: Diagnosis not present

## 2022-09-06 DIAGNOSIS — N1831 Chronic kidney disease, stage 3a: Secondary | ICD-10-CM

## 2022-09-06 DIAGNOSIS — N183 Chronic kidney disease, stage 3 unspecified: Secondary | ICD-10-CM | POA: Diagnosis not present

## 2022-09-06 DIAGNOSIS — E559 Vitamin D deficiency, unspecified: Secondary | ICD-10-CM

## 2022-09-06 DIAGNOSIS — R739 Hyperglycemia, unspecified: Secondary | ICD-10-CM

## 2022-09-06 DIAGNOSIS — N529 Male erectile dysfunction, unspecified: Secondary | ICD-10-CM | POA: Diagnosis not present

## 2022-09-06 DIAGNOSIS — I129 Hypertensive chronic kidney disease with stage 1 through stage 4 chronic kidney disease, or unspecified chronic kidney disease: Secondary | ICD-10-CM

## 2022-09-06 DIAGNOSIS — R69 Illness, unspecified: Secondary | ICD-10-CM | POA: Diagnosis not present

## 2022-09-06 DIAGNOSIS — E7800 Pure hypercholesterolemia, unspecified: Secondary | ICD-10-CM | POA: Diagnosis not present

## 2022-09-06 DIAGNOSIS — F5104 Psychophysiologic insomnia: Secondary | ICD-10-CM

## 2022-09-06 DIAGNOSIS — M109 Gout, unspecified: Secondary | ICD-10-CM | POA: Diagnosis not present

## 2022-09-06 DIAGNOSIS — G450 Vertebro-basilar artery syndrome: Secondary | ICD-10-CM | POA: Diagnosis not present

## 2022-09-06 MED ORDER — SILDENAFIL CITRATE 100 MG PO TABS: 50.0000 mg | ORAL_TABLET | Freq: Every day | ORAL | 0 refills | Status: DC | PRN

## 2022-09-07 LAB — COMPLETE METABOLIC PANEL WITH GFR
AG Ratio: 1.5 (calc) (ref 1.0–2.5)
ALT: 24 U/L (ref 9–46)
AST: 20 U/L (ref 10–35)
Albumin: 4.3 g/dL (ref 3.6–5.1)
Alkaline phosphatase (APISO): 68 U/L (ref 35–144)
BUN/Creatinine Ratio: 10 (calc) (ref 6–22)
BUN: 17 mg/dL (ref 7–25)
CO2: 28 mmol/L (ref 20–32)
Calcium: 8.9 mg/dL (ref 8.6–10.3)
Chloride: 108 mmol/L (ref 98–110)
Creat: 1.69 mg/dL — ABNORMAL HIGH (ref 0.70–1.28)
Globulin: 2.8 g/dL (calc) (ref 1.9–3.7)
Glucose, Bld: 103 mg/dL — ABNORMAL HIGH (ref 65–99)
Potassium: 4.4 mmol/L (ref 3.5–5.3)
Sodium: 144 mmol/L (ref 135–146)
Total Bilirubin: 0.5 mg/dL (ref 0.2–1.2)
Total Protein: 7.1 g/dL (ref 6.1–8.1)
eGFR: 43 mL/min/{1.73_m2} — ABNORMAL LOW (ref >=60)

## 2022-09-07 LAB — CBC WITH DIFFERENTIAL/PLATELET
Absolute Monocytes: 452 cells/uL (ref 200–950)
Basophils Absolute: 78 cells/uL (ref 0–200)
Basophils Relative: 1.5 %
Eosinophils Absolute: 239 cells/uL (ref 15–500)
Eosinophils Relative: 4.6 %
HCT: 42.7 % (ref 38.5–50.0)
Hemoglobin: 14.6 g/dL (ref 13.2–17.1)
Lymphs Abs: 2148 cells/uL (ref 850–3900)
MCH: 31.8 pg (ref 27.0–33.0)
MCHC: 34.2 g/dL (ref 32.0–36.0)
MCV: 93 fL (ref 80.0–100.0)
MPV: 11.3 fL (ref 7.5–12.5)
Monocytes Relative: 8.7 %
Neutro Abs: 2283 cells/uL (ref 1500–7800)
Neutrophils Relative %: 43.9 %
Platelets: 177 10*3/uL (ref 140–400)
RBC: 4.59 10*6/uL (ref 4.20–5.80)
RDW: 12.5 % (ref 11.0–15.0)
Total Lymphocyte: 41.3 %
WBC: 5.2 10*3/uL (ref 3.8–10.8)

## 2022-09-07 LAB — HEMOGLOBIN A1C
Hgb A1c MFr Bld: 6.3 % of total Hgb — ABNORMAL HIGH (ref <5.7)
Mean Plasma Glucose: 134 mg/dL
eAG (mmol/L): 7.4 mmol/L

## 2022-09-07 LAB — TSH: TSH: 2.89 mIU/L (ref 0.40–4.50)

## 2022-09-07 LAB — URIC ACID: Uric Acid, Serum: 6.4 mg/dL (ref 4.0–8.0)

## 2022-09-11 ENCOUNTER — Other Ambulatory Visit: Payer: Self-pay | Admitting: Family Medicine

## 2022-10-13 NOTE — Progress Notes (Unsigned)
Name: Frederick Nelson   MRN: [id number]    DOB: October 09, 1950   Date:10/16/2022       Progress Note  Subjective  Chief Complaint  Blood Pressure  HPI  HTN with CKI stage III: discussed results with patient, he is also concerned about A1C going up, he asked if a high protein diet would be good for him and I explained it will not going to be good for his kidney function. BP is back to normal. Discussed adding SGL-2 agonist and he is willing to try it. He denies chest pain or palpitation  Insomnia: he stopped taking Belsomra 10 mg due to causing him sleepy during the day. He has been taking some otc anti-histamine. He states works well for him. He has tried and failed Trazodone, Temazepam, Seroquel and now Belsomra.   OSA: he states he falls asleep during the day, he states has not decided on starting CPAP machine yet, states he is very sensitive to having anything on his face.   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 The tumor was 2.6 cm,low grade with zero of 14 nodes positive. No  evidence of venous or lymphatic invasion   COLONOSCOPY  [id number]   COLONOSCOPY WITH PROPOFOL N/A 08/04/2015   Procedure: COLONOSCOPY WITH PROPOFOL;  Surgeon: [name], MD;  Location: [facility] ENDOSCOPY;  Service: Endoscopy;  Laterality: N/A;   COLONOSCOPY WITH PROPOFOL N/A 09/01/2020   Procedure: COLONOSCOPY WITH PROPOFOL;  Surgeon: [name], MD;  Location: [facility] ENDOSCOPY;  Service: Endoscopy;  Laterality: N/A;    Family History  Problem Relation Age of Onset   Cancer Brother        cancer of the thymus   Diabetes Mother    Hypertension Father    Stroke Father    Cancer Maternal Aunt        breast cancer    Social History   Tobacco Use   Smoking status: Never   Smokeless tobacco: Never  Substance Use Topics   Alcohol use: Yes    Alcohol/week: 1.0 - 2.0 standard drink of alcohol    Types: 1 - 2 Standard drinks or equivalent per week    Comment: Socially only - Very little     Current Outpatient Medications:    Ascorbic Acid (VITA-C PO), Take 1 tablet by mouth daily., Disp: , Rfl:    aspirin 81 MG chewable tablet, Chew 1 tablet (81 mg total) by mouth daily., Disp: 30 tablet, Rfl: 11   atorvastatin (LIPITOR) 20 MG tablet, TAKE 1 TABLET BY MOUTH EVERY DAY, Disp: 90 tablet, Rfl: 0   clopidogrel (PLAVIX) 75 MG tablet, Take  1 tablet (75 mg total) by mouth daily., Disp: 90 tablet, Rfl: 1   Flaxseed, Linseed, (FLAXSEED OIL) 1000 MG CAPS, Take 1 capsule by mouth daily., Disp: , Rfl:    loratadine (CLARITIN) 10 MG tablet, TAKE 1 TABLET BY MOUTH EVERY DAY, Disp: 90 tablet, Rfl: 1   sildenafil (VIAGRA) 100 MG tablet, Take 0.5-1 tablets (50-100 mg total) by mouth daily as needed for erectile dysfunction., Disp: 30 tablet, Rfl: 0   Suvorexant 10 MG TABS, Take 1 tablet by mouth daily., Disp: 90 tablet, Rfl: 0   tamsulosin (FLOMAX) 0.4 MG CAPS capsule, TAKE 1 CAPSULE BY MOUTH EVERY DAY, Disp: 90 capsule, Rfl: 0   valACYclovir (VALTREX) 1000 MG tablet, Take 1 tablet (1,000 mg  total) by mouth daily., Disp: 90 tablet, Rfl: 3   valsartan (DIOVAN) 160 MG tablet, Take 1 tablet (160 mg total) by mouth daily., Disp: 90 tablet, Rfl: 0   Vitamin D, Ergocalciferol, (DRISDOL) 1.25 MG (50000 UNIT) CAPS capsule, TAKE 1 CAPSULE (50,000 UNITS TOTAL) BY MOUTH EVERY 7 (SEVEN) DAYS, Disp: 12 capsule, Rfl: 1  Allergies  Allergen Reactions   Ace Inhibitors Swelling   Ciprofloxacin     Tendon damage    I personally reviewed active problem list, medication list, allergies, family history, social history, health maintenance with the patient/caregiver today.   ROS  Ten systems reviewed and is negative except as mentioned in HPI   Objective  Vitals:   10/16/22 1011  BP: 130/72  Pulse: 75  Resp: 16  Temp: 97.7 F (36.5 C)  TempSrc: Oral  SpO2: 100%  Weight: 202 lb 9.6 oz (91.9 kg)  Height: 5' 11.5" (1.816 m)    Body mass index is 27.86 kg/m.  Physical Exam  Constitutional: Patient appears well-developed and well-nourished. Obese  No distress.  HEENT: head atraumatic, normocephalic, pupils equal and reactive to light, neck supple Cardiovascular: Normal rate, regular rhythm and normal heart sounds.  No murmur heard. No BLE edema. Pulmonary/Chest: Effort normal and breath sounds normal. No respiratory distress. Abdominal: Soft.  There is no tenderness. Psychiatric: Patient has a normal mood and affect. behavior is normal. Judgment and thought content normal.    PHQ2/9:    10/16/2022   10:11 AM 09/06/2022    8:09 AM 08/01/2022    9:10 AM 06/15/2022   10:22 AM 04/14/2022    8:23 AM  Depression screen PHQ 2/9  Decreased Interest 0 0 0 0 0  Down, Depressed, Hopeless 0 0 0 0 0  PHQ - 2 Score 0 0 0 0 0  Altered sleeping 1 1 1 3 $ 0  Tired, decreased energy 1 1 1 $ 0 0  Change in appetite 0 0 0 0 0  Feeling bad or failure about yourself  0 0 0 0 0  Trouble concentrating 0 0 1 1 0  Moving slowly or fidgety/restless 0 0 0 0 0  Suicidal thoughts 0 0 0 0 0  PHQ-9 Score 2  2 3 4 $ 0  Difficult doing work/chores Not difficult at all        phq 9 is negative   Fall Risk:    10/16/2022   10:09 AM 09/06/2022    8:04 AM 08/01/2022    9:09 AM 06/15/2022   10:22 AM 04/14/2022    8:23 AM  Fall Risk   Falls in the past year? 0 0 0 0 0  Number falls in past yr:  0  0   Injury with Fall?  0  0  Risk for fall due to : No Fall Risks No Fall Risks No Fall Risks No Fall Risks No Fall Risks  Follow up Falls prevention discussed;Education provided;Falls evaluation completed Falls prevention discussed Falls prevention discussed;Education provided Falls prevention discussed Falls prevention discussed      Functional Status Survey: Is the patient deaf or have difficulty hearing?: Yes Does the patient have difficulty seeing, even when wearing glasses/contacts?: No Does the patient have difficulty concentrating, remembering, or making decisions?: Yes Does the patient have difficulty walking or climbing stairs?: No Does the patient have difficulty dressing or bathing?: No Does the patient have difficulty doing errands alone such as visiting a doctor's office or shopping?: No    Assessment & Plan  1. Benign hypertension with chronic kidney disease, stage III (HCC)  - empagliflozin (JARDIANCE) 25 MG TABS tablet; Take 1 tablet (25 mg total) by mouth daily before breakfast.  Dispense: 30 tablet; Refill: 2  2. Prediabetes  Discussed healthy carbs  3. Stage 3a chronic kidney disease (HCC)  - empagliflozin (JARDIANCE) 25 MG TABS tablet; Take 1 tablet (25 mg total) by mouth daily before breakfast.  Dispense: 30 tablet; Refill: 2  4. Chronic insomnia  He will bring the bottle of medication that he has been taking at home     5. OSA (obstructive sleep apnea)  Explained likely the cause of daytime fatigue

## 2022-10-16 ENCOUNTER — Ambulatory Visit (INDEPENDENT_AMBULATORY_CARE_PROVIDER_SITE_OTHER): Payer: Medicare HMO | Admitting: Family Medicine

## 2022-10-16 ENCOUNTER — Encounter: Payer: Self-pay | Admitting: Family Medicine

## 2022-10-16 VITALS — BP 130/72 | HR 75 | Temp 97.7°F | Resp 16 | Ht 71.5 in | Wt 202.6 lb

## 2022-10-16 DIAGNOSIS — N1831 Chronic kidney disease, stage 3a: Secondary | ICD-10-CM | POA: Diagnosis not present

## 2022-10-16 DIAGNOSIS — G4733 Obstructive sleep apnea (adult) (pediatric): Secondary | ICD-10-CM

## 2022-10-16 DIAGNOSIS — I129 Hypertensive chronic kidney disease with stage 1 through stage 4 chronic kidney disease, or unspecified chronic kidney disease: Secondary | ICD-10-CM | POA: Diagnosis not present

## 2022-10-16 DIAGNOSIS — R7303 Prediabetes: Secondary | ICD-10-CM | POA: Diagnosis not present

## 2022-10-16 DIAGNOSIS — N183 Chronic kidney disease, stage 3 unspecified: Secondary | ICD-10-CM | POA: Diagnosis not present

## 2022-10-16 DIAGNOSIS — F5104 Psychophysiologic insomnia: Secondary | ICD-10-CM

## 2022-10-16 DIAGNOSIS — R69 Illness, unspecified: Secondary | ICD-10-CM | POA: Diagnosis not present

## 2022-10-16 MED ORDER — EMPAGLIFLOZIN 25 MG PO TABS: 25.0000 mg | ORAL_TABLET | Freq: Every day | ORAL | 2 refills | Status: DC

## 2022-10-20 ENCOUNTER — Other Ambulatory Visit: Payer: Self-pay | Admitting: Family Medicine

## 2022-10-20 DIAGNOSIS — A6002 Herpesviral infection of other male genital organs: Secondary | ICD-10-CM

## 2022-10-30 ENCOUNTER — Other Ambulatory Visit: Payer: Self-pay | Admitting: Family Medicine

## 2022-10-30 MED ORDER — DAYVIGO 5 MG PO TABS: 1.0000 | ORAL_TABLET | Freq: Every evening | ORAL | 0 refills | Status: DC

## 2022-11-01 ENCOUNTER — Other Ambulatory Visit: Payer: Self-pay | Admitting: Family Medicine

## 2022-11-01 DIAGNOSIS — E559 Vitamin D deficiency, unspecified: Secondary | ICD-10-CM

## 2022-11-01 NOTE — Telephone Encounter (Signed)
Requested medication (s) are due for refill today: yes  Requested medication (s) are on the active medication list: yes  Last refill:  05/07/22 #12/1  Future visit scheduled: yes  Notes to clinic:  Unable to refill per protocol, cannot delegate.    Requested Prescriptions  Pending Prescriptions Disp Refills   Vitamin D, Ergocalciferol, (DRISDOL) 1.25 MG (50000 UNIT) CAPS capsule [Pharmacy Med Name: VITAMIN D2 1.'25MG'$ (50,000 UNIT)] 12 capsule 1    Sig: TAKE 1 CAPSULE (50,000 UNITS TOTAL) BY MOUTH EVERY 7 (SEVEN) DAYS     Endocrinology:  Vitamins - Vitamin D Supplementation 2 Failed - 11/01/2022  5:02 PM      Failed - Manual Review: Route requests for 50,000 IU strength to the provider      Passed - Ca in normal range and within 360 days    Calcium  Date Value Ref Range Status  09/06/2022 8.9 8.6 - 10.3 mg/dL Final         Passed - Vitamin D in normal range and within 360 days    Vit D, 25-Hydroxy  Date Value Ref Range Status  06/15/2022 54 30 - 100 ng/mL Final    Comment:    Vitamin D Status         25-OH Vitamin D: . Deficiency:                    <20 ng/mL Insufficiency:             20 - 29 ng/mL Optimal:                 > or = 30 ng/mL . For 25-OH Vitamin D testing on patients on  D2-supplementation and patients for whom quantitation  of D2 and D3 fractions is required, the QuestAssureD(TM) 25-OH VIT D, (D2,D3), LC/MS/MS is recommended: order  code [id number] (patients >32yr). . See Note 1 . Note 1 . For additional information, please refer to  http://education.Quest[facility].com/faq/FAQ199  (This link is being provided for informational/ educational purposes only.)          Passed - Valid encounter within last 12 months    Recent Outpatient Visits           2 weeks ago Benign hypertension with chronic kidney disease, stage III ([facility]   1 month ago Benign hypertension with chronic kidney disease, stage  III ([facility]   [facility]   4 months ago Well adult exam   [facility]   6 months ago Stage 3b chronic kidney disease ([facility]   [facility]   9 months ago Hospital discharge follow-up   [facility]       Future Appointments             In 2 months SAncil Boozer KDrue Stager [facility] [place]  In 7 months SSteele Sizer [facility] [place]  In 9 months  [facility]

## 2022-11-14 ENCOUNTER — Other Ambulatory Visit: Payer: Self-pay | Admitting: Family Medicine

## 2022-12-02 ENCOUNTER — Other Ambulatory Visit: Payer: Self-pay | Admitting: Family Medicine

## 2022-12-02 DIAGNOSIS — N1831 Chronic kidney disease, stage 3a: Secondary | ICD-10-CM

## 2022-12-02 DIAGNOSIS — I10 Essential (primary) hypertension: Secondary | ICD-10-CM

## 2022-12-02 DIAGNOSIS — N183 Chronic kidney disease, stage 3 unspecified: Secondary | ICD-10-CM

## 2022-12-08 ENCOUNTER — Other Ambulatory Visit: Payer: Self-pay | Admitting: Family Medicine

## 2022-12-08 DIAGNOSIS — N1831 Chronic kidney disease, stage 3a: Secondary | ICD-10-CM

## 2022-12-08 DIAGNOSIS — I10 Essential (primary) hypertension: Secondary | ICD-10-CM

## 2022-12-08 DIAGNOSIS — N183 Chronic kidney disease, stage 3 unspecified: Secondary | ICD-10-CM

## 2022-12-08 NOTE — Telephone Encounter (Signed)
Requested Prescriptions  Refused Prescriptions Disp Refills   valsartan (DIOVAN) 160 MG tablet 90 tablet 0    Sig: Take 1 tablet (160 mg total) by mouth daily.     Cardiovascular:  Angiotensin Receptor Blockers Failed - 12/08/2022  1:30 PM      Failed - Cr in normal range and within 180 days    Creat  Date Value Ref Range Status  09/06/2022 1.69 (H) 0.70 - 1.28 mg/dL Final   Creatinine, Urine  Date Value Ref Range Status  10/15/2020 174 20 - 320 mg/dL Final         Passed - K in normal range and within 180 days    Potassium  Date Value Ref Range Status  09/06/2022 4.4 3.5 - 5.3 mmol/L Final         Passed - Patient is not pregnant      Passed - Last BP in normal range    BP Readings from Last 1 Encounters:  10/16/22 130/72         Passed - Valid encounter within last 6 months    Recent Outpatient Visits           1 month ago Benign hypertension with chronic kidney disease, stage III [facility])   [facility] Alba Cory, MD   3 months ago Benign hypertension with chronic kidney disease, stage III [facility])   [place] [facility] Alba Cory, MD   5 months ago Well adult exam   [facility] Alba Cory, MD   7 months ago Stage 3b chronic kidney disease [facility])   [facility] Alba Cory, MD   10 months ago Hospital discharge follow-up   [facility] Caro Laroche, DO       Future Appointments             In 1 month Carlynn Purl, Danna Hefty, MD [facility]   In 6 months Alba Cory, MD [facility]   In 8 months  [facility]

## 2022-12-08 NOTE — Telephone Encounter (Signed)
Medication Refill - Medication: valsartan (DIOVAN) 160 MG tablet [[id number]]   Has the patient contacted their pharmacy? Yes.    (Agent: If yes, when and what did the pharmacy advise?) Contact PCP   Preferred Pharmacy (with phone number or street name): [id number] - [place], [place] - [address]   Has the patient been seen for an appointment in the last year OR does the patient have an upcoming appointment? Yes.    Agent: Please be advised that RX refills may take up to 3 business days. We ask that you follow-up with your pharmacy.   Pt has one pill left.

## 2022-12-08 NOTE — Telephone Encounter (Signed)
Called pharmacy - rx is ready to be picked up. Pt has been notified using their text message system.

## 2023-01-05 ENCOUNTER — Other Ambulatory Visit: Payer: Self-pay

## 2023-01-05 ENCOUNTER — Emergency Department: Payer: Medicare HMO

## 2023-01-05 ENCOUNTER — Emergency Department
Admission: EM | Admit: 2023-01-05 | Discharge: 2023-01-05 | Disposition: A | Discharge status: Home / Self Care | Payer: Medicare HMO | Attending: Emergency Medicine | Admitting: Emergency Medicine

## 2023-01-05 ENCOUNTER — Encounter: Payer: Self-pay | Admitting: Emergency Medicine

## 2023-01-05 DIAGNOSIS — Z1152 Encounter for screening for COVID-19: Secondary | ICD-10-CM | POA: Diagnosis not present

## 2023-01-05 DIAGNOSIS — Z743 Need for continuous supervision: Secondary | ICD-10-CM | POA: Diagnosis not present

## 2023-01-05 DIAGNOSIS — I129 Hypertensive chronic kidney disease with stage 1 through stage 4 chronic kidney disease, or unspecified chronic kidney disease: Secondary | ICD-10-CM | POA: Insufficient documentation

## 2023-01-05 DIAGNOSIS — Z8673 Personal history of transient ischemic attack (TIA), and cerebral infarction without residual deficits: Secondary | ICD-10-CM | POA: Insufficient documentation

## 2023-01-05 DIAGNOSIS — R42 Dizziness and giddiness: Secondary | ICD-10-CM | POA: Diagnosis not present

## 2023-01-05 DIAGNOSIS — I959 Hypotension, unspecified: Secondary | ICD-10-CM | POA: Diagnosis not present

## 2023-01-05 DIAGNOSIS — R55 Syncope and collapse: Secondary | ICD-10-CM | POA: Diagnosis not present

## 2023-01-05 DIAGNOSIS — E860 Dehydration: Secondary | ICD-10-CM | POA: Insufficient documentation

## 2023-01-05 DIAGNOSIS — N189 Chronic kidney disease, unspecified: Secondary | ICD-10-CM | POA: Insufficient documentation

## 2023-01-05 DIAGNOSIS — R197 Diarrhea, unspecified: Secondary | ICD-10-CM | POA: Insufficient documentation

## 2023-01-05 DIAGNOSIS — Z043 Encounter for examination and observation following other accident: Secondary | ICD-10-CM | POA: Diagnosis not present

## 2023-01-05 DIAGNOSIS — W19XXXA Unspecified fall, initial encounter: Secondary | ICD-10-CM | POA: Diagnosis not present

## 2023-01-05 LAB — LIPASE, BLOOD: Lipase: 29 U/L (ref 11–51)

## 2023-01-05 LAB — CBC WITH DIFFERENTIAL/PLATELET
Abs Immature Granulocytes: 0.02 10*3/uL (ref 0.00–0.07)
Basophils Absolute: 0.1 10*3/uL (ref 0.0–0.1)
Basophils Relative: 2 %
Eosinophils Absolute: 0.3 10*3/uL (ref 0.0–0.5)
Eosinophils Relative: 6 %
HCT: 41.3 % (ref 39.0–52.0)
Hemoglobin: 13.2 g/dL (ref 13.0–17.0)
Immature Granulocytes: 0 %
Lymphocytes Relative: 31 %
Lymphs Abs: 1.9 10*3/uL (ref 0.7–4.0)
MCH: 30.6 pg (ref 26.0–34.0)
MCHC: 32 g/dL (ref 30.0–36.0)
MCV: 95.8 fL (ref 80.0–100.0)
Monocytes Absolute: 0.7 10*3/uL (ref 0.1–1.0)
Monocytes Relative: 11 %
Neutro Abs: 2.9 10*3/uL (ref 1.7–7.7)
Neutrophils Relative %: 50 %
Platelets: 215 10*3/uL (ref 150–400)
RBC: 4.31 MIL/uL (ref 4.22–5.81)
RDW: 13 % (ref 11.5–15.5)
WBC: 5.9 10*3/uL (ref 4.0–10.5)
nRBC: 0 % (ref 0.0–0.2)

## 2023-01-05 LAB — COMPREHENSIVE METABOLIC PANEL
ALT: 26 U/L (ref 0–44)
AST: 21 U/L (ref 15–41)
Albumin: 3.6 g/dL (ref 3.5–5.0)
Alkaline Phosphatase: 52 U/L (ref 38–126)
Anion gap: 10 (ref 5–15)
BUN: 24 mg/dL — ABNORMAL HIGH (ref 8–23)
CO2: 25 mmol/L (ref 22–32)
Calcium: 8.4 mg/dL — ABNORMAL LOW (ref 8.9–10.3)
Chloride: 105 mmol/L (ref 98–111)
Creatinine, Ser: 1.54 mg/dL — ABNORMAL HIGH (ref 0.61–1.24)
GFR, Estimated: 48 mL/min — ABNORMAL LOW (ref >60)
Glucose, Bld: 95 mg/dL (ref 70–99)
Potassium: 3.8 mmol/L (ref 3.5–5.1)
Sodium: 140 mmol/L (ref 135–145)
Total Bilirubin: 0.8 mg/dL (ref 0.3–1.2)
Total Protein: 7 g/dL (ref 6.5–8.1)

## 2023-01-05 LAB — RESP PANEL BY RT-PCR (RSV, FLU A&B, COVID)  RVPGX2
Influenza A by PCR: NEGATIVE
Influenza B by PCR: NEGATIVE
Resp Syncytial Virus by PCR: NEGATIVE
SARS Coronavirus 2 by RT PCR: NEGATIVE

## 2023-01-05 LAB — MAGNESIUM: Magnesium: 2.1 mg/dL (ref 1.7–2.4)

## 2023-01-05 LAB — TROPONIN I (HIGH SENSITIVITY): Troponin I (High Sensitivity): 4 ng/L (ref <18)

## 2023-01-05 NOTE — ED Triage Notes (Signed)
First nurse note: Patient arrived by EMS from [facility] store. While shopping had syncopal episode and hit back of head on ground. Reports diarrhea.   60/40 initial for FD  EMS vitals: 140/70 b/p 60s HR 99% RA  EMS administered 400cc fluid and 4mg  zofran

## 2023-01-05 NOTE — ED Triage Notes (Signed)
Pt via EMS from the store. Pt had a syncopal episode. Pt states he does not have a headache but stated that someone did state he hit his head. Denies blood thinners. EMS reports hypotension. Pt is A&Ox4 and NAD

## 2023-01-05 NOTE — ED Provider Notes (Signed)
[facility] Provider Note    Event Date/Time   First MD Initiated Contact with Patient 01/05/23 1751     (approximate)   History   Loss of Consciousness   HPI  Frederick Nelson is a 72 y.o. male with a history of CKD, hypertension, stroke who presents after a syncopal episode.  Patient reports that he had abdominal discomfort followed by diarrhea earlier today.  He then left the bathroom and started to feel lightheaded while walking.  He apparently syncopized.Marland Kitchen  He reports this has happened before in a similar situation.  No nausea or vomiting.  No chest pain.  No diaphoresis.  He feels well now.     Physical Exam   Triage Vital Signs: ED Triage Vitals  Enc Vitals Group     BP 01/05/23 1642 (!) 156/70     Pulse Rate 01/05/23 1642 (!) 54     Resp 01/05/23 1642 20     Temp 01/05/23 1637 97.7 F (36.5 C)     Temp Source 01/05/23 1637 Oral     SpO2 01/05/23 1637 91 %     Weight 01/05/23 1640 93 kg (205 lb)     Height 01/05/23 1640 1.803 m (5\' 11" )     Head Circumference --      Peak Flow --      Pain Score 01/05/23 1640 0     Pain Loc --      Pain Edu? --      Excl. in GC? --     Most recent vital signs: Vitals:   01/05/23 1642 01/05/23 1807  BP: (!) 156/70 (!) 163/70  Pulse: (!) 54 (!) 56  Resp: 20 18  Temp: 97.7 F (36.5 C)   SpO2: 100% 100%     General: Awake, no distress.  CV:  Good peripheral perfusion.  Regular rate and rhythm Resp:  Normal effort.  Clear to auscultation bilaterally Abd:  No distention.  Other:  No calf pain or swelling   ED Results / Procedures / Treatments   Labs (all labs ordered are listed, but only abnormal results are displayed) Labs Reviewed  COMPREHENSIVE METABOLIC PANEL - Abnormal; Notable for the following components:      Result Value   BUN 24 (*)    Creatinine, Ser 1.54 (*)    Calcium 8.4 (*)    GFR, Estimated 48 (*)    All other components within normal limits  RESP PANEL BY RT-PCR (RSV,  FLU A&B, COVID)  [id number]  CBC WITH DIFFERENTIAL/PLATELET  LIPASE, BLOOD  MAGNESIUM  URINALYSIS, ROUTINE W REFLEX MICROSCOPIC  TROPONIN I (HIGH SENSITIVITY)  TROPONIN I (HIGH SENSITIVITY)     EKG  ED ECG REPORT I, Jene Every, the attending physician, personally viewed and interpreted this ECG.  Date: 01/05/2023  Rhythm: normal sinus rhythm QRS Axis: normal Intervals: normal ST/T Wave abnormalities: normal Narrative Interpretation: no evidence of acute ischemia    RADIOLOGY Chest x-ray viewed interpret by me, no acute abnormality    PROCEDURES:  Critical Care performed:   Procedures   MEDICATIONS ORDERED IN ED: Medications - No data to display   IMPRESSION / MDM / ASSESSMENT AND PLAN / ED COURSE  I reviewed the triage vital signs and the nursing notes. Patient's presentation is most consistent with acute presentation with potential threat to life or bodily function.  Patient presents for syncopal episode as detailed above.  Differential includes vasovagal episode, dehydration, arrhythmia  Patient denies palpitations, EKG high  sensitive troponin is reassuring  BUN/creatinine ratio is elevated however patient does have a history of CKD, suspect a component of dehydration.  Symptoms occurring after bowel movement suspicious for vasovagal episode as well.  Patient is feeling improved and is asymptomatic here with reassuring vitals, no indication for admission at this time, patient agrees with this plan        FINAL CLINICAL IMPRESSION(S) / ED DIAGNOSES   Final diagnoses:  Syncope and collapse  Dehydration     Rx / DC Orders   ED Discharge Orders     None        Note:  This document was prepared using Dragon voice recognition software and may include unintentional dictation errors.   Jene Every, MD 01/05/23 2157

## 2023-01-05 NOTE — ED Provider Triage Note (Signed)
Emergency Medicine Provider Triage Evaluation Note  Frederick Nelson , a 72 y.o. male  was evaluated in triage.  Pt complains of syncope, possible fall with head injury. Patient passed out after his BP dropped. PAtient reports diarrhea before syncope. Patient passed out, hit head. No subsequent syncope. No CP, abd pain.  Review of Systems  Positive: Syncope, diarrhea, possible head injury Negative: Fever, abd pain, CP  Physical Exam  BP (!) 156/70 (BP Location: Left Arm)   Pulse (!) 54   Temp 97.7 F (36.5 C) (Oral)   Resp 20   Ht 5\' 11"  (1.803 m)   Wt 93 kg   SpO2 100%   BMI 28.59 kg/m  Gen:   Awake, no distress   Resp:  Normal effort  MSK:   Moves extremities without difficulty  Other:    Medical Decision Making  Medically screening exam initiated at 4:45 PM.  Appropriate orders placed.  Frederick Nelson was informed that the remainder of the evaluation will be completed by another provider, this initial triage assessment does not replace that evaluation, and the importance of remaining in the ED until their evaluation is complete.  Labs, EKG, xray, CTs   Frederick Nelson, [place] 01/05/23 1645

## 2023-01-12 NOTE — Progress Notes (Unsigned)
Name: Frederick Nelson   MRN: [id number]    DOB: [birth year]   Date:01/15/2023       Progress Note  Subjective  Chief Complaint  Follow Up  HPI  HTN with CKI stage III: discussed results with patient, he is also concerned about A1C going up, he asked if a high protein diet would be good for him and I explained it will not going to be good for his kidney function. BP is back to normal. Discussed adding SGL-2 agonist and he is willing to try it. He denies chest pain or palpitation  Insomnia: he stopped taking Belsomra 10 mg due to causing him sleepy during the day. He has been taking some otc anti-histamine. He states works well for him. He has tried and failed Trazodone, Temazepam, Seroquel and now Belsomra.   OSA: he states he falls asleep during the day, he states has not decided on starting CPAP machine yet, states he is very sensitive to having anything on his face.   Balance problems/ visual changes and also paresthesia: he was having  intermittent balance problems, also paresthesia - on and off - feels like burning or itching of skin anywhere on his body it lasts a few minutes per episode on and off intermittently. He also has intermittent visual disturbance , he states it got worse prior to the TIA back in May 23. He is taking statin therapy and aspirin and is doing well, no recent episodes . He had a syncopal episode 10 days ago while shopping - similar symptoms two years ago. Both episodes preceded by taking viagra and while standing up. This time he felt dizzy and he trying to sit down and passed out before he reached the floor. He went to [facility] on 05/10 and had negative cardiac enzymes and also CT head . 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   Left knee pain: seen by ortho,  he wore a brace for a while but no longer wearing it now , had an injection and symptoms are much better. He also has been doing home exercises after one session of PT   IMPRESSION:06/2022  1. Possible tiny  undersurface tears of the medial meniscus body/posterior horn junction. Focal fraying of the posterior horn free edge. 2. Small focal near full-thickness cartilage defect over the medial trochlea. 3. Small joint effusion and Baker cyst.  Insomnia: he took Ambien for a while, followed by Trazodone and since August he is taking Seroquel, he states it is causing to feel sluggish and in a fog all day and affected his sex life, he is now on Belsomra 15 mg , initially it worked however noticing only lasting 4 hours, we increased the dose , he decided to stop because medication was helping him  sleep at night but felt tired during the day.   Positive home study and advised to have an auto adjusting CPAP at 4-20 cmH2O, he states he is ready to schedule the sleep study now   Back pain: it was causing  hip pain, he was  by [facility]. Dr. Orson Slick  and had a couple of steroid injections on right hip, he states first injection worked better than second. He states pain is not as severe, he states symptoms worse at the end of the day, or when lifting objects.. He had to stop playing golf because it aggravated symptoms. He was seen by Dr. Adan Sis of hips no significant arthritis noticed , he had MRI and diagnosed with mild  L5 stenosis He went to PT and is feeling better He states pain is zero however has not resumed Golf yet   IMPRESSION: MRI done 09/2021  Chronic levoconvex lumbar scoliosis. But mild for age lumbar spine degeneration. No spinal stenosis or discrete disc herniation. No convincing right side neural impingement, with only mild bilateral foraminal stenosis at the L5 nerve levels.   ED: he takes Viagra prn, he denies side effects, discussed risk of hypotenstion with NTG. Viagra does not work well but did not like the penile pump or using penile injections.He was seen by Urologist. He states he has been taking half dose and sometimes takes 75 mg to make it work. He took a dose last night  without problems today. Advised to monitor and if he has another syncopal episode after taking medication he will need to stop taking it    Gout attack: he is off HCTZ now, he has intermittent stinging , he is taking prednisone prn, last uric acid was down from 9.2 to 6.4   Herpes genitalis: he is taking prophylactic medication and no recent episodes . Unchanged   Pre-diabetes: he has a history of elevated glucose, last A1C  was up to 6.3 %. He has metabolic syndrome based on HTN, elevated glucose and increase . Discussed importance of life style modification   Patient Active Problem List   Diagnosis Date Noted   Benign hypertension with chronic kidney disease, stage III (HCC) 10/16/2022   Prediabetes 10/16/2022   OSA (obstructive sleep apnea) 09/06/2022   History of TIAs 09/06/2022   Hyperglycemia 09/06/2022   Vertebrobasilar insufficiency 01/18/2022   TIA (transient ischemic attack) 01/17/2022   BPH (benign prostatic hyperplasia) 01/17/2022   Monoclonal gammopathy 07/13/2021   Chronic kidney disease 06/06/2021   Essential hypertension 06/06/2021   Sinus bradycardia 06/19/2018   Hyperlipidemia 11/02/2017   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 The tumor was 2.6 cm,low grade with zero of 14 nodes positive. No evidence of venous or lymphatic invasion   COLONOSCOPY  [id number]   COLONOSCOPY WITH PROPOFOL N/A 08/04/2015   Procedure: COLONOSCOPY WITH PROPOFOL;  Surgeon: Earline Mayotte, MD;  Location: [facility] ENDOSCOPY;  Service:  Endoscopy;  Laterality: N/A;   COLONOSCOPY WITH PROPOFOL N/A 09/01/2020   Procedure: COLONOSCOPY WITH PROPOFOL;  Surgeon: Earline Mayotte, MD;  Location: [facility] ENDOSCOPY;  Service: Endoscopy;  Laterality: N/A;    Family History  Problem Relation Age of Onset   Cancer Brother        cancer of the thymus   Diabetes Mother    Hypertension Father    Stroke Father    Cancer Maternal Aunt        breast cancer    Social History   Tobacco Use   Smoking status: Never   Smokeless tobacco: Never  Substance Use Topics   Alcohol use: Yes    Alcohol/week: 1.0 - 2.0 standard drink of alcohol    Types: 1 - 2 Standard drinks or equivalent per week    Comment: Socially only - Very little  Current Outpatient Medications:    Ascorbic Acid (VITA-C PO), Take 1 tablet by mouth daily., Disp: , Rfl:    aspirin 81 MG chewable tablet, Chew 1 tablet (81 mg total) by mouth daily., Disp: 30 tablet, Rfl: 11   atorvastatin (LIPITOR) 20 MG tablet, TAKE 1 TABLET BY MOUTH EVERY DAY, Disp: 90 tablet, Rfl: 0   clopidogrel (PLAVIX) 75 MG tablet, TAKE 1 TABLET BY MOUTH EVERY DAY, Disp: 90 tablet, Rfl: 0   Flaxseed, Linseed, (FLAXSEED OIL) 1000 MG CAPS, Take 1 capsule by mouth daily., Disp: , Rfl:    Lemborexant (DAYVIGO) 5 MG TABS, Take 1 tablet (5 mg total) by mouth at bedtime., Disp: 90 tablet, Rfl: 0   loratadine (CLARITIN) 10 MG tablet, TAKE 1 TABLET BY MOUTH EVERY DAY, Disp: 90 tablet, Rfl: 1   sildenafil (VIAGRA) 100 MG tablet, Take 0.5-1 tablets (50-100 mg total) by mouth daily as needed for erectile dysfunction., Disp: 30 tablet, Rfl: 0   tamsulosin (FLOMAX) 0.4 MG CAPS capsule, TAKE 1 CAPSULE BY MOUTH EVERY DAY, Disp: 90 capsule, Rfl: 0   valACYclovir (VALTREX) 1000 MG tablet, TAKE 1 TABLET BY MOUTH EVERY DAY, Disp: 90 tablet, Rfl: 0   valsartan (DIOVAN) 160 MG tablet, TAKE 1 TABLET BY MOUTH EVERY DAY, Disp: 90 tablet, Rfl: 0   Vitamin D, Ergocalciferol, (DRISDOL) 1.25 MG (50000 UNIT) CAPS capsule,  TAKE 1 CAPSULE (50,000 UNITS TOTAL) BY MOUTH EVERY 7 (SEVEN) DAYS, Disp: 12 capsule, Rfl: 1  Allergies  Allergen Reactions   Ace Inhibitors Swelling   Ciprofloxacin     Tendon damage   Jardiance [Empagliflozin] Rash    I personally reviewed active problem list, medication list, allergies, family history, social history, health maintenance with the patient/caregiver today.   ROS  Ten systems reviewed and is negative except as mentioned in HPI   Objective  Vitals:   01/15/23 1102  BP: 126/70  Pulse: 74  Resp: 18  Temp: 98.1 F (36.7 C)  TempSrc: Oral  SpO2: 97%  Weight: 201 lb 6.4 oz (91.4 kg)  Height: 5\' 11"  (1.803 m)    Body mass index is 28.09 kg/m.  Physical Exam Constitutional: Patient appears well-developed and well-nourished.  No distress.  HEENT: head atraumatic, normocephalic, pupils equal and reactive to light, neck supple Cardiovascular: Normal rate, regular rhythm and normal heart sounds.  No murmur heard. No BLE edema. Pulmonary/Chest: Effort normal and breath sounds normal. No respiratory distress. Abdominal: Soft.  There is no tenderness. Psychiatric: Patient has a normal mood and affect. behavior is normal. Judgment and thought content normal.    PHQ2/9:    01/15/2023   11:04 AM 10/16/2022   10:11 AM 09/06/2022    8:09 AM 08/01/2022    9:10 AM 06/15/2022   10:22 AM  Depression screen PHQ 2/9  Decreased Interest 1 0 0 0 0  Down, Depressed, Hopeless 0 0 0 0 0  PHQ - 2 Score 1 0 0 0 0  Altered sleeping 2 1 1 1 3   Tired, decreased energy 1 1 1 1  0  Change in appetite 0 0 0 0 0  Feeling bad or failure about yourself  0 0 0 0 0  Trouble concentrating 0 0 0 1 1  Moving slowly or fidgety/restless 0 0 0 0 0  Suicidal thoughts 0 0 0 0 0  PHQ-9 Score 4 2 2 3 4   Difficult doing work/chores Not difficult at all Not difficult at all       phq 9  is negative   Fall Risk:    01/15/2023   11:03 AM 10/16/2022   10:09 AM 09/06/2022    8:04 AM 08/01/2022     9:09 AM 06/15/2022   10:22 AM  Fall Risk   Falls in the past year? 1 0 0 0 0  Number falls in past yr: 0  0  0  Injury with Fall? 0  0  0  Risk for fall due to : History of fall(s) No Fall Risks No Fall Risks No Fall Risks No Fall Risks  Follow up Falls prevention discussed;Education provided;Falls evaluation completed Falls prevention discussed;Education provided;Falls evaluation completed Falls prevention discussed Falls prevention discussed;Education provided Falls prevention discussed    Assessment & Plan  1. Benign hypertension with chronic kidney disease, stage III (HCC)  - valsartan (DIOVAN) 160 MG tablet; Take 1 tablet (160 mg total) by mouth daily.  Dispense: 90 tablet; Refill: 0  2. Stage 3a chronic kidney disease (HCC)  - valsartan (DIOVAN) 160 MG tablet; Take 1 tablet (160 mg total) by mouth daily.  Dispense: 90 tablet; Refill: 0  3. Essential hypertension  - valsartan (DIOVAN) 160 MG tablet; Take 1 tablet (160 mg total) by mouth daily.  Dispense: 90 tablet; Refill: 0  4. Chronic insomnia  - Lemborexant (DAYVIGO) 5 MG TABS; Take 1 tablet (5 mg total) by mouth at bedtime.  Dispense: 90 tablet; Refill: 0  5. Prediabetes   6. History of TIA (transient ischemic attack)  - atorvastatin (LIPITOR) 20 MG tablet; Take 1 tablet (20 mg total) by mouth daily.  Dispense: 90 tablet; Refill: 1 - clopidogrel (PLAVIX) 75 MG tablet; Take 1 tablet (75 mg total) by mouth daily.  Dispense: 90 tablet; Refill: 1  7. BPH with obstruction/lower urinary tract symptoms  - tamsulosin (FLOMAX) 0.4 MG CAPS capsule; Take 1 capsule (0.4 mg total) by mouth daily.  Dispense: 90 capsule; Refill: 0  8. History of syncope

## 2023-01-15 ENCOUNTER — Encounter: Payer: Self-pay | Admitting: Family Medicine

## 2023-01-15 ENCOUNTER — Ambulatory Visit (INDEPENDENT_AMBULATORY_CARE_PROVIDER_SITE_OTHER): Payer: Medicare HMO | Admitting: Family Medicine

## 2023-01-15 VITALS — BP 126/70 | HR 74 | Temp 98.1°F | Resp 18 | Ht 71.0 in | Wt 201.4 lb

## 2023-01-15 DIAGNOSIS — Z8673 Personal history of transient ischemic attack (TIA), and cerebral infarction without residual deficits: Secondary | ICD-10-CM

## 2023-01-15 DIAGNOSIS — N1831 Chronic kidney disease, stage 3a: Secondary | ICD-10-CM

## 2023-01-15 DIAGNOSIS — I129 Hypertensive chronic kidney disease with stage 1 through stage 4 chronic kidney disease, or unspecified chronic kidney disease: Secondary | ICD-10-CM | POA: Diagnosis not present

## 2023-01-15 DIAGNOSIS — N183 Chronic kidney disease, stage 3 unspecified: Secondary | ICD-10-CM

## 2023-01-15 DIAGNOSIS — N138 Other obstructive and reflux uropathy: Secondary | ICD-10-CM | POA: Diagnosis not present

## 2023-01-15 DIAGNOSIS — I10 Essential (primary) hypertension: Secondary | ICD-10-CM | POA: Diagnosis not present

## 2023-01-15 DIAGNOSIS — R7303 Prediabetes: Secondary | ICD-10-CM

## 2023-01-15 DIAGNOSIS — N401 Enlarged prostate with lower urinary tract symptoms: Secondary | ICD-10-CM | POA: Diagnosis not present

## 2023-01-15 DIAGNOSIS — F5104 Psychophysiologic insomnia: Secondary | ICD-10-CM | POA: Diagnosis not present

## 2023-01-15 DIAGNOSIS — Z87898 Personal history of other specified conditions: Secondary | ICD-10-CM

## 2023-01-15 MED ORDER — ATORVASTATIN CALCIUM 20 MG PO TABS: 20.0000 mg | ORAL_TABLET | Freq: Every day | ORAL | 1 refills | Status: DC

## 2023-01-15 MED ORDER — DAYVIGO 5 MG PO TABS
1.0000 | ORAL_TABLET | Freq: Every evening | ORAL | 0 refills | Status: DC
Start: 2023-01-15 — End: 2023-03-01

## 2023-01-15 MED ORDER — CLOPIDOGREL BISULFATE 75 MG PO TABS: 75.0000 mg | ORAL_TABLET | Freq: Every day | ORAL | 1 refills | Status: DC

## 2023-01-15 MED ORDER — VALSARTAN 160 MG PO TABS: 160.0000 mg | ORAL_TABLET | Freq: Every day | ORAL | 0 refills | Status: DC

## 2023-01-15 MED ORDER — TAMSULOSIN HCL 0.4 MG PO CAPS: 0.4000 mg | ORAL_CAPSULE | Freq: Every day | ORAL | 0 refills | Status: DC

## 2023-01-17 DIAGNOSIS — H35033 Hypertensive retinopathy, bilateral: Secondary | ICD-10-CM | POA: Diagnosis not present

## 2023-01-17 DIAGNOSIS — Z0100 Encounter for examination of eyes and vision without abnormal findings: Secondary | ICD-10-CM | POA: Diagnosis not present

## 2023-01-17 DIAGNOSIS — H524 Presbyopia: Secondary | ICD-10-CM | POA: Diagnosis not present

## 2023-01-19 ENCOUNTER — Other Ambulatory Visit: Payer: Self-pay | Admitting: Family Medicine

## 2023-01-19 DIAGNOSIS — A6002 Herpesviral infection of other male genital organs: Secondary | ICD-10-CM

## 2023-02-15 DIAGNOSIS — H1032 Unspecified acute conjunctivitis, left eye: Secondary | ICD-10-CM | POA: Diagnosis not present

## 2023-02-26 DIAGNOSIS — H6993 Unspecified Eustachian tube disorder, bilateral: Secondary | ICD-10-CM | POA: Diagnosis not present

## 2023-02-27 ENCOUNTER — Other Ambulatory Visit: Payer: Self-pay | Admitting: Family Medicine

## 2023-02-27 DIAGNOSIS — F5104 Psychophysiologic insomnia: Secondary | ICD-10-CM

## 2023-02-28 ENCOUNTER — Telehealth: Payer: Self-pay | Admitting: Family Medicine

## 2023-02-28 NOTE — Telephone Encounter (Signed)
Pt is calling in because the pharmacy sent a request to have his medication Lemborexant (DAYVIGO) 5 MG TABS [[id number]] filled and he was told it was denied. Pt wants to know if there is another provider that can handle this matter. Pt says it is important that he gets this medication because he can't sleep without it and he could potentially have a stroke without it. Pt says he cannot wait until Dr. Carlynn Purl returns to have this medication filled. Please advise.

## 2023-03-01 ENCOUNTER — Other Ambulatory Visit: Payer: Self-pay | Admitting: Family Medicine

## 2023-03-01 DIAGNOSIS — F5104 Psychophysiologic insomnia: Secondary | ICD-10-CM

## 2023-03-01 MED ORDER — DAYVIGO 5 MG PO TABS
1.0000 | ORAL_TABLET | Freq: Every evening | ORAL | 0 refills | Status: DC
Start: 2023-03-01 — End: 2023-04-17

## 2023-04-16 NOTE — Progress Notes (Unsigned)
Name: Frederick Nelson   MRN: [id number]    DOB: [birth year]   Date:04/17/2023       Progress Note  Subjective  Chief Complaint  Follow Up  HPI  HTN with CKI stage IIIa: discussed results with patient. BP is back to normal. Discussed adding SGL-2 agonist but he developed a rash on his neck, currently only taking Valsartan for bp and kidney protection .  He denies chest pain or palpitation  Insomnia: he stopped taking Belsomra 10 mg due to causing him sleepy during the day. He has been taking some otc anti-histamine. He states works well for him. He has tried and failed Trazodone, Temazepam, Seroquel and now Belsomra. He is now taking Dayvigo 5 mg did not keep asleep all night, but 10 mg makes too somnolent the following day, currently taking one and half and is doing well. No side effects  OSA:  he states has not decided on starting CPAP machine yet, states he is very sensitive to having anything on his face. He is doing better on Dayvigo.  Positive home study and advised to have an auto adjusting CPAP at 4-20 cm H2O - he does not want to go forward with that   Balance problems/ visual changes and also paresthesia: he was having  intermittent balance problems, also paresthesia - on and off - feels like burning or itching of skin anywhere on his body it lasts a few minutes per episode on and off intermittently. He also has intermittent visual disturbance , he states it got worse prior to the TIA back in May 23. He is taking statin therapy and aspirin and is doing well, no recent episodes . He had a syncopal episode 10 days ago while shopping - similar symptoms two years ago. Both episodes preceded by taking viagra and while standing up. This time he felt dizzy and he trying to sit down and passed out before he reached the floor. He went to [facility] on 05/10 and had negative cardiac enzymes and also CT head . 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 Discussed referral to neurologist and he is willing to be  evaluated now   Left knee pain: seen by ortho,  he wore a brace for a while but no longer wearing it now , had an injection and symptoms are much better. He also has been doing home exercises after one session of PT   IMPRESSION:06/2022  1. Possible tiny undersurface tears of the medial meniscus body/posterior horn junction. Focal fraying of the posterior horn free edge. 2. Small focal near full-thickness cartilage defect over the medial trochlea. 3. Small joint effusion and Baker cyst.  Back pain: it was causing  hip pain, he was  by [facility]. Dr. Orson Slick  and had a couple of steroid injections on right hip, he states first injection worked better than second. He states pain is not as severe, he states symptoms worse at the end of the day, or when lifting objects.. He had to stop playing golf because it aggravated symptoms. He was seen by Dr. Adan Sis of hips no significant arthritis noticed , he had MRI and diagnosed with mild L5 stenosis He went to PT and is feeling better. He is no longer golfing and seems to be controlling without that activity   IMPRESSION: MRI done 09/2021  Chronic levoconvex lumbar scoliosis. But mild for age lumbar spine degeneration. No spinal stenosis or discrete disc herniation. No convincing right side neural impingement, with only mild bilateral  foraminal stenosis at the L5 nerve levels.   ED: he takes Viagra prn, he denies side effects, discussed risk of hypotenstion with NTG. Viagra does not work well but did not like the penile pump or using penile injections.He was seen by Urologist. He states he has been taking half dose and sometimes takes 75 mg to make it work. He took a dose last night without problems today. Advised to monitor and if he has another syncopal episode after taking medication he will need to stop taking it . Unchanged    Gout attack: he is off HCTZ now, he is doing well at this time , he is taking prednisone prn, last uric acid was  down from 9.2 to 6.4   Herpes genitalis: he is taking prophylactic medication and no recent episodes .  Continue Valtrex   Pre-diabetes: he has a history of elevated glucose, last A1C  was up to 6.3 % but today is down to 6.2 % , he is no longer eating bread . He has metabolic syndrome based on HTN, elevated glucose and increase .  Change in bowel movements: over the past year, but getting worse, having to have a bowel movement right after he eats .   Patient Active Problem List   Diagnosis Date Noted   Benign hypertension with chronic kidney disease, stage III (HCC) 10/16/2022   Prediabetes 10/16/2022   OSA (obstructive sleep apnea) 09/06/2022   History of TIA (transient ischemic attack) 09/06/2022   Hyperglycemia 09/06/2022   Vertebrobasilar insufficiency 01/18/2022   TIA (transient ischemic attack) 01/17/2022   BPH with obstruction/lower urinary tract symptoms 01/17/2022   Monoclonal gammopathy 07/13/2021   Chronic kidney disease 06/06/2021   Essential hypertension 06/06/2021   Sinus bradycardia 06/19/2018   Hyperlipidemia 11/02/2017   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 The tumor was 2.6 cm,low grade with zero of 14 nodes positive. No evidence of venous or lymphatic invasion   COLONOSCOPY  [id number]   COLONOSCOPY WITH PROPOFOL N/A 08/04/2015   Procedure: COLONOSCOPY WITH PROPOFOL;  Surgeon: Earline Mayotte, MD;  Location: [facility] ENDOSCOPY;  Service: Endoscopy;  Laterality: N/A;   COLONOSCOPY WITH PROPOFOL N/A 09/01/2020    Procedure: COLONOSCOPY WITH PROPOFOL;  Surgeon: Earline Mayotte, MD;  Location: [facility] ENDOSCOPY;  Service: Endoscopy;  Laterality: N/A;    Family History  Problem Relation Age of Onset   Cancer Brother        cancer of the thymus   Diabetes Mother    Hypertension Father    Stroke Father    Cancer Maternal Aunt        breast cancer    Social History   Tobacco Use   Smoking status: Never   Smokeless tobacco: Never  Substance Use Topics   Alcohol use: Yes    Alcohol/week: 1.0 - 2.0 standard drink of alcohol    Types: 1 - 2 Standard drinks or equivalent per week    Comment: Socially only - Very little     Current Outpatient Medications:    Ascorbic Acid (VITA-C  PO), Take 1 tablet by mouth daily., Disp: , Rfl:    Flaxseed, Linseed, (FLAXSEED OIL) 1000 MG CAPS, Take 1 capsule by mouth daily., Disp: , Rfl:    Lemborexant (DAYVIGO) 10 MG TABS, Take 1 tablet (10 mg total) by mouth at bedtime., Disp: 90 tablet, Rfl: 1   atorvastatin (LIPITOR) 20 MG tablet, Take 1 tablet (20 mg total) by mouth daily., Disp: 90 tablet, Rfl: 1   clopidogrel (PLAVIX) 75 MG tablet, Take 1 tablet (75 mg total) by mouth daily., Disp: 90 tablet, Rfl: 1   loratadine (CLARITIN) 10 MG tablet, Take 1 tablet (10 mg total) by mouth daily., Disp: 90 tablet, Rfl: 1   sildenafil (VIAGRA) 100 MG tablet, Take 0.5-1 tablets (50-100 mg total) by mouth daily as needed for erectile dysfunction., Disp: 30 tablet, Rfl: 0   tamsulosin (FLOMAX) 0.4 MG CAPS capsule, Take 1 capsule (0.4 mg total) by mouth daily., Disp: 90 capsule, Rfl: 1   valACYclovir (VALTREX) 1000 MG tablet, Take 1 tablet (1,000 mg total) by mouth daily., Disp: 90 tablet, Rfl: 1   valsartan (DIOVAN) 160 MG tablet, Take 1 tablet (160 mg total) by mouth daily., Disp: 90 tablet, Rfl: 1   Vitamin D, Ergocalciferol, (DRISDOL) 1.25 MG (50000 UNIT) CAPS capsule, Take 1 capsule (50,000 Units total) by mouth every 7 (seven) days., Disp: 12 capsule, Rfl: 1  Allergies   Allergen Reactions   Ace Inhibitors Swelling   Ciprofloxacin     Tendon damage   Jardiance [Empagliflozin] Rash    I personally reviewed active problem list, medication list, allergies, family history, social history, health maintenance with the patient/caregiver today.   ROS  Ten systems reviewed and is negative except as mentioned in HPI    Objective  Vitals:   04/17/23 1058  BP: 122/66  Pulse: 67  Resp: 14  Temp: 97.8 F (36.6 C)  TempSrc: Oral  SpO2: 99%  Weight: 197 lb 6.4 oz (89.5 kg)  Height: 5\' 11"  (1.803 m)    Body mass index is 27.53 kg/m.  Physical Exam  Constitutional: Patient appears well-developed and well-nourished.  No distress.  HEENT: head atraumatic, normocephalic, pupils equal and reactive to light, neck supple Cardiovascular: Normal rate, regular rhythm and normal heart sounds.  No murmur heard. No BLE edema. Pulmonary/Chest: Effort normal and breath sounds normal. No respiratory distress. Abdominal: Soft.  There is no tenderness. Neuro: no tremors, romberg negative Skin: mass on left posterior axillary line , non tender, smooth borders  Psychiatric: Patient has a normal mood and affect. behavior is normal. Judgment and thought content normal.   Recent Results (from the past 2160 hour(s))  POCT HgB A1C     Status: Abnormal   Collection Time: 04/17/23 11:11 AM  Result Value Ref Range   Hemoglobin A1C 6.3 (A) 4.0 - 5.6 %   HbA1c POC (<> result, manual entry)     HbA1c, POC (prediabetic range)     HbA1c, POC (controlled diabetic range)       PHQ2/9:    04/17/2023   11:09 AM 01/15/2023   11:04 AM 10/16/2022   10:11 AM 09/06/2022    8:09 AM 08/01/2022    9:10 AM  Depression screen PHQ 2/9  Decreased Interest 0 1 0 0 0  Down, Depressed, Hopeless 0 0 0 0 0  PHQ - 2 Score 0 1 0 0 0  Altered sleeping 0 2 1 1 1   Tired, decreased energy 0 1 1 1 1   Change in appetite 0  0 0 0 0  Feeling bad or failure about yourself  0 0 0 0 0  Trouble  concentrating 0 0 0 0 1  Moving slowly or fidgety/restless 0 0 0 0 0  Suicidal thoughts 0 0 0 0 0  PHQ-9 Score 0 4 2 2 3   Difficult doing work/chores  Not difficult at all Not difficult at all      phq 9 is negative   Fall Risk:    04/17/2023   11:09 AM 01/15/2023   11:03 AM 10/16/2022   10:09 AM 09/06/2022    8:04 AM 08/01/2022    9:09 AM  Fall Risk   Falls in the past year? 1 1 0 0 0  Number falls in past yr: 0 0  0   Injury with Fall? 0 0  0   Risk for fall due to : History of fall(s) History of fall(s) No Fall Risks No Fall Risks No Fall Risks  Follow up Falls evaluation completed;Education provided;Falls prevention discussed Falls prevention discussed;Education provided;Falls evaluation completed Falls prevention discussed;Education provided;Falls evaluation completed Falls prevention discussed Falls prevention discussed;Education provided      Functional Status Survey: Is the patient deaf or have difficulty hearing?: Yes Does the patient have difficulty seeing, even when wearing glasses/contacts?: No Does the patient have difficulty concentrating, remembering, or making decisions?: No Does the patient have difficulty walking or climbing stairs?: No Does the patient have difficulty dressing or bathing?: No Does the patient have difficulty doing errands alone such as visiting a doctor's office or shopping?: No    Assessment & Plan  1. Benign hypertension with chronic kidney disease, stage III (HCC)  - valsartan (DIOVAN) 160 MG tablet; Take 1 tablet (160 mg total) by mouth daily.  Dispense: 90 tablet; Refill: 1  2. Stage 3a chronic kidney disease (HCC)  - valsartan (DIOVAN) 160 MG tablet; Take 1 tablet (160 mg total) by mouth daily.  Dispense: 90 tablet; Refill: 1  3. Prediabetes  - POCT HgB A1C  4. History of TIA (transient ischemic attack)  - clopidogrel (PLAVIX) 75 MG tablet; Take 1 tablet (75 mg total) by mouth daily.  Dispense: 90 tablet; Refill: 1 -  atorvastatin (LIPITOR) 20 MG tablet; Take 1 tablet (20 mg total) by mouth daily.  Dispense: 90 tablet; Refill: 1 - Ambulatory referral to Neurology  5. BPH with obstruction/lower urinary tract symptoms  - tamsulosin (FLOMAX) 0.4 MG CAPS capsule; Take 1 capsule (0.4 mg total) by mouth daily.  Dispense: 90 capsule; Refill: 1  6. History of syncope  - Ambulatory referral to Neurology  7. Chronic insomnia  - Lemborexant (DAYVIGO) 10 MG TABS; Take 1 tablet (10 mg total) by mouth at bedtime.  Dispense: 90 tablet; Refill: 1  8. Vitamin D deficiency  - Vitamin D, Ergocalciferol, (DRISDOL) 1.25 MG (50000 UNIT) CAPS capsule; Take 1 capsule (50,000 Units total) by mouth every 7 (seven) days.  Dispense: 12 capsule; Refill: 1  9. Change in bowel movement  - Ambulatory referral to Gastroenterology  10. Herpes genitalis in men  - valACYclovir (VALTREX) 1000 MG tablet; Take 1 tablet (1,000 mg total) by mouth daily.  Dispense: 90 tablet; Refill: 1  11. Erectile dysfunction, unspecified erectile dysfunction type  - sildenafil (VIAGRA) 100 MG tablet; Take 0.5-1 tablets (50-100 mg total) by mouth daily as needed for erectile dysfunction.  Dispense: 30 tablet; Refill: 0  12. Mass of skin of back  - Ambulatory referral to General Surgery  13. Seasonal allergic rhinitis,  unspecified trigger  - loratadine (CLARITIN) 10 MG tablet; Take 1 tablet (10 mg total) by mouth daily.  Dispense: 90 tablet; Refill: 1

## 2023-04-17 ENCOUNTER — Encounter: Payer: Self-pay | Admitting: Family Medicine

## 2023-04-17 ENCOUNTER — Ambulatory Visit: Payer: Medicare HMO | Admitting: Family Medicine

## 2023-04-17 VITALS — BP 122/66 | HR 67 | Temp 97.8°F | Resp 14 | Ht 71.0 in | Wt 197.4 lb

## 2023-04-17 DIAGNOSIS — N529 Male erectile dysfunction, unspecified: Secondary | ICD-10-CM

## 2023-04-17 DIAGNOSIS — N183 Chronic kidney disease, stage 3 unspecified: Secondary | ICD-10-CM | POA: Diagnosis not present

## 2023-04-17 DIAGNOSIS — R7303 Prediabetes: Secondary | ICD-10-CM | POA: Diagnosis not present

## 2023-04-17 DIAGNOSIS — R222 Localized swelling, mass and lump, trunk: Secondary | ICD-10-CM

## 2023-04-17 DIAGNOSIS — I129 Hypertensive chronic kidney disease with stage 1 through stage 4 chronic kidney disease, or unspecified chronic kidney disease: Secondary | ICD-10-CM

## 2023-04-17 DIAGNOSIS — Z8673 Personal history of transient ischemic attack (TIA), and cerebral infarction without residual deficits: Secondary | ICD-10-CM | POA: Diagnosis not present

## 2023-04-17 DIAGNOSIS — R198 Other specified symptoms and signs involving the digestive system and abdomen: Secondary | ICD-10-CM | POA: Diagnosis not present

## 2023-04-17 DIAGNOSIS — N401 Enlarged prostate with lower urinary tract symptoms: Secondary | ICD-10-CM | POA: Diagnosis not present

## 2023-04-17 DIAGNOSIS — N138 Other obstructive and reflux uropathy: Secondary | ICD-10-CM

## 2023-04-17 DIAGNOSIS — E559 Vitamin D deficiency, unspecified: Secondary | ICD-10-CM

## 2023-04-17 DIAGNOSIS — A6002 Herpesviral infection of other male genital organs: Secondary | ICD-10-CM

## 2023-04-17 DIAGNOSIS — F5104 Psychophysiologic insomnia: Secondary | ICD-10-CM

## 2023-04-17 DIAGNOSIS — N1831 Chronic kidney disease, stage 3a: Secondary | ICD-10-CM | POA: Diagnosis not present

## 2023-04-17 DIAGNOSIS — Z87898 Personal history of other specified conditions: Secondary | ICD-10-CM

## 2023-04-17 DIAGNOSIS — J302 Other seasonal allergic rhinitis: Secondary | ICD-10-CM

## 2023-04-17 LAB — POCT GLYCOSYLATED HEMOGLOBIN (HGB A1C): Hemoglobin A1C: 6.3 % — AB (ref 4.0–5.6)

## 2023-04-17 MED ORDER — SILDENAFIL CITRATE 100 MG PO TABS
50.0000 mg | ORAL_TABLET | Freq: Every day | ORAL | 0 refills | Status: AC | PRN
Start: 2023-04-17 — End: ?

## 2023-04-17 MED ORDER — VITAMIN D (ERGOCALCIFEROL) 1.25 MG (50000 UNIT) PO CAPS
50000.0000 [IU] | ORAL_CAPSULE | ORAL | 1 refills | Status: DC
Start: 2023-04-17 — End: 2023-09-14

## 2023-04-17 MED ORDER — ATORVASTATIN CALCIUM 20 MG PO TABS: 20.0000 mg | ORAL_TABLET | Freq: Every day | ORAL | 1 refills | Status: DC

## 2023-04-17 MED ORDER — CLOPIDOGREL BISULFATE 75 MG PO TABS: 75.0000 mg | ORAL_TABLET | Freq: Every day | ORAL | 1 refills | Status: DC

## 2023-04-17 MED ORDER — VALACYCLOVIR HCL 1 G PO TABS: 1000.0000 mg | ORAL_TABLET | Freq: Every day | ORAL | 1 refills | Status: DC

## 2023-04-17 MED ORDER — TAMSULOSIN HCL 0.4 MG PO CAPS: 0.4000 mg | ORAL_CAPSULE | Freq: Every day | ORAL | 1 refills | Status: DC

## 2023-04-17 MED ORDER — DAYVIGO 10 MG PO TABS
1.0000 | ORAL_TABLET | Freq: Every day | ORAL | 1 refills | Status: DC
Start: 2023-04-17 — End: 2023-11-13

## 2023-04-17 MED ORDER — VALSARTAN 160 MG PO TABS: 160.0000 mg | ORAL_TABLET | Freq: Every day | ORAL | 1 refills | Status: DC

## 2023-04-17 MED ORDER — LORATADINE 10 MG PO TABS: 10.0000 mg | ORAL_TABLET | Freq: Every day | ORAL | 1 refills | Status: DC

## 2023-04-19 ENCOUNTER — Encounter: Payer: Self-pay | Admitting: Surgery

## 2023-04-19 ENCOUNTER — Ambulatory Visit (INDEPENDENT_AMBULATORY_CARE_PROVIDER_SITE_OTHER): Payer: Medicare HMO | Admitting: Surgery

## 2023-04-19 VITALS — BP 152/69 | HR 56 | Temp 98.0°F | Ht 71.0 in | Wt 197.2 lb

## 2023-04-19 DIAGNOSIS — R2232 Localized swelling, mass and lump, left upper limb: Secondary | ICD-10-CM

## 2023-04-19 DIAGNOSIS — M7989 Other specified soft tissue disorders: Secondary | ICD-10-CM

## 2023-04-19 NOTE — Progress Notes (Signed)
Patient ID: Frederick Nelson, male   DOB: 1951/04/02, 72 y.o.   MRN: [id number]  Chief Complaint: Mass under left arm  History of Present Illness Frederick Nelson is a 72 y.o. male with mass overlying his left latissimus dorsi, present for 6 months.  Slight change in size with diminishment initially, and subsequent increase.  No significant discomfort, no drainage.  No inflammatory changes, no fevers no chills.  He denies any history of discharge.  He is currently taking Plavix which he began taking May 2023.  He would like to proceed with excision to ensure no neoplastic threat.  Past Medical History Past Medical History:  Diagnosis Date   Allergy    Arthritis    Benign hypertension with chronic kidney disease, stage III (HCC)    Chronic low back pain    CKD (chronic kidney disease), stage III (HCC)    Degenerative joint disease (DJD) of hip    Genital herpes    Takes Valtrex for prevention   GERD (gastroesophageal reflux disease) 1995   Hyperlipidemia    Hypertension    Malignant neoplasm of ascending colon (HCC) 2011   Special screening for malignant neoplasms, colon 2011   Stroke [facility])       Past Surgical History:  Procedure Laterality Date   COLON SURGERY  06/26/2007   right hemicolectomy-T2N0 carcinoma of right colon. The tumor was 2.6 cm,low grade with zero of 14 nodes positive. No evidence of venous or lymphatic invasion   COLONOSCOPY  [id number]   COLONOSCOPY WITH PROPOFOL N/A 08/04/2015   Procedure: COLONOSCOPY WITH PROPOFOL;  Surgeon: Earline Mayotte, MD;  Location: [facility] ENDOSCOPY;  Service: Endoscopy;  Laterality: N/A;   COLONOSCOPY WITH PROPOFOL N/A 09/01/2020   Procedure: COLONOSCOPY WITH PROPOFOL;  Surgeon: Earline Mayotte, MD;  Location: [facility] ENDOSCOPY;  Service: Endoscopy;  Laterality: N/A;    Allergies  Allergen Reactions   Ace Inhibitors Swelling   Ciprofloxacin     Tendon damage   Jardiance [Empagliflozin] Rash    Current Outpatient Medications   Medication Sig Dispense Refill   Ascorbic Acid (VITA-C PO) Take 1 tablet by mouth daily.     atorvastatin (LIPITOR) 20 MG tablet Take 1 tablet (20 mg total) by mouth daily. 90 tablet 1   clopidogrel (PLAVIX) 75 MG tablet Take 1 tablet (75 mg total) by mouth daily. 90 tablet 1   Flaxseed, Linseed, (FLAXSEED OIL) 1000 MG CAPS Take 1 capsule by mouth daily.     Lemborexant (DAYVIGO) 10 MG TABS Take 1 tablet (10 mg total) by mouth at bedtime. 90 tablet 1   loratadine (CLARITIN) 10 MG tablet Take 1 tablet (10 mg total) by mouth daily. 90 tablet 1   sildenafil (VIAGRA) 100 MG tablet Take 0.5-1 tablets (50-100 mg total) by mouth daily as needed for erectile dysfunction. 30 tablet 0   tamsulosin (FLOMAX) 0.4 MG CAPS capsule Take 1 capsule (0.4 mg total) by mouth daily. 90 capsule 1   valACYclovir (VALTREX) 1000 MG tablet Take 1 tablet (1,000 mg total) by mouth daily. 90 tablet 1   valsartan (DIOVAN) 160 MG tablet Take 1 tablet (160 mg total) by mouth daily. 90 tablet 1   Vitamin D, Ergocalciferol, (DRISDOL) 1.25 MG (50000 UNIT) CAPS capsule Take 1 capsule (50,000 Units total) by mouth every 7 (seven) days. 12 capsule 1   No current facility-administered medications for this visit.    Family History Family History  Problem Relation Age of Onset   Cancer Brother  cancer of the thymus   Diabetes Mother    Hypertension Father    Stroke Father    Cancer Maternal Aunt        breast cancer      Social History Social History   Tobacco Use   Smoking status: Never    Passive exposure: Never   Smokeless tobacco: Never  Vaping Use   Vaping status: Never Used  Substance Use Topics   Alcohol use: Yes    Alcohol/week: 1.0 - 2.0 standard drink of alcohol    Types: 1 - 2 Standard drinks or equivalent per week    Comment: Socially only - Very little   Drug use: No        Review of Systems  Constitutional: Negative.   HENT:  Positive for hearing loss.   Eyes: Negative.   Respiratory:  Negative.    Cardiovascular: Negative.   Gastrointestinal:  Positive for diarrhea and melena.  Genitourinary:  Positive for frequency and urgency.  Neurological:  Positive for dizziness and loss of consciousness.  Psychiatric/Behavioral: Negative.       Physical Exam Blood pressure (!) 152/69, pulse (!) 56, temperature 98 F (36.7 C), height 5\' 11"  (1.803 m), weight 197 lb 3.2 oz (89.4 kg), SpO2 97%. Last Weight  Most recent update: 04/19/2023 11:35 AM    Weight  89.4 kg (197 lb 3.2 oz)             CONSTITUTIONAL: Well developed, and nourished, appropriately responsive and aware without distress.   EYES: Sclera non-icteric.   EARS, NOSE, MOUTH AND THROAT:  The oropharynx is clear. Oral mucosa is pink and moist.   Hearing is intact to voice.  NECK: Trachea is midline, and there is no jugular venous distension.  LYMPH NODES:  Lymph nodes in the neck are not appreciated. RESPIRATORY:  Normal respiratory effort without pathologic use of accessory muscles. CARDIOVASCULAR: Well perfused.  GI: The abdomen is  soft, nontender, and nondistended. There were no palpable masses.   MUSCULOSKELETAL:  Symmetrical muscle tone appreciated in all four extremities.    SKIN: Skin turgor is normal. No pathologic skin lesions appreciated.   There is a subdermal mass overlying the left latissimus dorsi approximately 2.6 cm in largest diameter, it is discrete and mobile does not appear to be tethered to the underlying soft tissue or fascia.  NEUROLOGIC:  Motor and sensation appear grossly normal.  Cranial nerves are grossly without defect. PSYCH:  Alert and oriented to person, place and time. Affect is appropriate for situation.  Data Reviewed I have personally reviewed what is currently available of the patient's imaging, recent labs and medical records.   Labs:     Latest Ref Rng & Units 01/05/2023    4:47 PM 09/06/2022    9:01 AM 04/12/2022    3:32 PM  CBC  WBC 4.0 - 10.5 K/uL 5.9  5.2  5.4    Hemoglobin 13.0 - 17.0 g/dL 16.1  09.6  04.5   Hematocrit 39.0 - 52.0 % 41.3  42.7  43.3   Platelets 150 - 400 K/uL 215  177  211       Latest Ref Rng & Units 01/05/2023    4:47 PM 09/06/2022    9:01 AM 06/15/2022   11:25 AM  CMP  Glucose 70 - 99 mg/dL 95  409  87   BUN 8 - 23 mg/dL 24  17  24    Creatinine 0.61 - 1.24 mg/dL 8.11  9.14  7.82  Sodium 135 - 145 mmol/L 140  144  141   Potassium 3.5 - 5.1 mmol/L 3.8  4.4  4.7   Chloride 98 - 111 mmol/L 105  108  106   CO2 22 - 32 mmol/L 25  28  27    Calcium 8.9 - 10.3 mg/dL 8.4  8.9  9.2   Total Protein 6.5 - 8.1 g/dL 7.0  7.1    Total Bilirubin 0.3 - 1.2 mg/dL 0.8  0.5    Alkaline Phos 38 - 126 U/L 52     AST 15 - 41 U/L 21  20    ALT 0 - 44 U/L 26  24        Imaging: Radiological images reviewed:   Within last 24 hrs: No results found.  Assessment    2.6 cm discrete soft tissue mass overlying the left latissimus dorsi. Patient Active Problem List   Diagnosis Date Noted   Benign hypertension with chronic kidney disease, stage III (HCC) 10/16/2022   Prediabetes 10/16/2022   OSA (obstructive sleep apnea) 09/06/2022   History of TIA (transient ischemic attack) 09/06/2022   Hyperglycemia 09/06/2022   Vertebrobasilar insufficiency 01/18/2022   TIA (transient ischemic attack) 01/17/2022   BPH with obstruction/lower urinary tract symptoms 01/17/2022   Monoclonal gammopathy 07/13/2021   Chronic kidney disease 06/06/2021   Essential hypertension 06/06/2021   Sinus bradycardia 06/19/2018   Hyperlipidemia 11/02/2017   Herpes simplex infection of penis 11/01/2017   Erectile dysfunction 08/09/2016   Vitamin D deficiency 08/09/2016   Blurred vision, bilateral 04/27/2016   Degenerative joint disease (DJD) of hip 04/27/2016   Stage III chronic kidney disease (HCC) 01/26/2016   Chronic insomnia 01/26/2016   Bilateral leg edema 01/26/2016   History of colon cancer 06/10/2015    Plan    Excision of left flank/back soft tissue  mass approximately 2.6 cm in estimated diameter.  Risks reviewed with the patient which include but are not limited to local anesthesia, bleeding, hematoma, infection, recurrence.  I believe he understands these are not all inclusive and having had this questions adequately answered, desires to proceed.  Guarantees were ever expressed or implied.   Face-to-face time spent with the patient and accompanying care providers(if present) was 20 minutes, with more than 50% of the time spent counseling, educating, and coordinating care of the patient.    These notes generated with voice recognition software. I apologize for typographical errors.  Campbell Lerner M.D., FACS 04/21/2023, 6:05 PM

## 2023-04-19 NOTE — Patient Instructions (Addendum)
We would like you to stop your Plavix until we do your surgery next Thursday. We will have you restart this after your surgery on next Saturday.   Lipoma Removal  Lipoma removal is a surgical procedure to remove a lipoma, which is a noncancerous (benign) tumor that is made up of fat cells. Most lipomas are small and painless and do not require treatment. They can form in many areas of the body but are most common under the skin of the back, arms, shoulders, buttocks, and thighs. You may need lipoma removal if you have a lipoma that is large, growing, or causing discomfort. Lipoma removal may also be done for cosmetic reasons. Tell a health care provider about: Any allergies you have. All medicines you are taking, including vitamins, herbs, eye drops, creams, and over-the-counter medicines. Any problems you or family members have had with anesthetic medicines. Any bleeding problems you have. Any surgeries you have had. Any medical conditions you have. Whether you are pregnant or may be pregnant. What are the risks? Generally, this is a safe procedure. However, problems may occur, including: Infection. Bleeding. Scarring. Allergic reactions to medicines. Damage to nearby structures or organs, such as damage to nerves or blood vessels near the lipoma. What happens before the procedure? Medicines Ask your health care provider about: Changing or stopping your regular medicines. This is especially important if you are taking diabetes medicines or blood thinners. Taking medicines such as aspirin and ibuprofen. These medicines can thin your blood. Do not take these medicines unless your health care provider tells you to take them. Taking over-the-counter medicines, vitamins, herbs, and supplements. General instructions You will have a physical exam. Your health care provider will check the size of the lipoma and whether it can be removed easily. You may have a biopsy and imaging tests, such as  X-rays, a CT scan, and an MRI. Do not use any products that contain nicotine or tobacco for at least 4 weeks before the procedure. These products include cigarettes, chewing tobacco, and vaping devices, such as e-cigarettes. If you need help quitting, ask your health care provider. Ask your health care provider: How your surgery site will be marked. What steps will be taken to help prevent infection. These may include: Washing skin with a germ-killing soap. Taking antibiotic medicine. What happens during the procedure?  You will be given the following: A medicine to numb the area (local anesthetic). An incision will be made into the skin over the lipoma or very near the lipoma. The incision may be made in a natural skin line or crease. Tissues, nerves, and blood vessels near the lipoma will be moved out of the way. The lipoma and the capsule that surrounds it will be separated from the surrounding tissues. The lipoma will be removed. The incision may be closed with stitches (sutures). Surgical glue will be used to close the top layer of tissue.   After the procedure: Do not take baths, swim, or use a hot tub until your health care provider approves. Ask your health care provider if you may take showers. You may only be allowed to take sponge baths. Do not scratch or pick at the wound. Keep it covered as told by your health care provider. Return to your normal activities as told by your health care provider. Ask your health care provider what activities are safe for you. Protect your wound from the sun when you are outside for the first 6 months, or for as long as  told by your health care provider. Cover up the scar area or apply sunscreen that has an SPF of at least 30.

## 2023-04-21 DIAGNOSIS — M7989 Other specified soft tissue disorders: Secondary | ICD-10-CM | POA: Insufficient documentation

## 2023-04-26 ENCOUNTER — Encounter: Payer: Self-pay | Admitting: Surgery

## 2023-04-26 ENCOUNTER — Ambulatory Visit: Payer: Medicare HMO | Admitting: Surgery

## 2023-04-26 VITALS — BP 154/72 | HR 81 | Temp 98.2°F | Ht 71.0 in | Wt 199.0 lb

## 2023-04-26 DIAGNOSIS — M7989 Other specified soft tissue disorders: Secondary | ICD-10-CM

## 2023-04-26 DIAGNOSIS — R222 Localized swelling, mass and lump, trunk: Secondary | ICD-10-CM | POA: Diagnosis not present

## 2023-04-26 NOTE — Patient Instructions (Addendum)
Today we have removed a Lipoma in our office. Please see information below regarding this type of tumor. You may restart your Plavix on Saturday.   You are free to shower tomorrow, do not scrub at the area.  Your incision was closed with Dermabond.  It is best to keep it clean and dry, it will tolerate a brief shower, but do not soak it or apply any creams or lotions to the incisions.  The Dermabond should gradually flake off over time.  Keep it open to air so you can evaluate your incisions.  Dermabond assists the underlying sutures to keep your incision closed and protected from infection.  Should you develop some drainage from your incision, some drops of drainage would be okay but if it persists continue to put keep a dry dressing over it.   You have glue on your skin and sutures under the skin. The glue will come off on it's own in 10-14 days. You may shower normally until this occurs but do not submerge.  Please use Tylenol or Ibuprofen for pain as needed. You may use ice to the area 3-4 times today and tomorrow for any achiness.   You may continue your regular activities right away but if you are having pain while doing something, stop what you are doing and try this activity once again in 3 days. Please call our office with any questions or concerns prior to your appointment.   Lipoma Removal Lipoma removal is a surgical procedure to remove a noncancerous (benign) tumor that is made up of fat cells (lipoma). Most lipomas are small and painless and do not require treatment. They can form in many areas of the body but are most common under the skin of the back, shoulders, arms, and thighs. You may need lipoma removal if you have a lipoma that is large, growing, or causing discomfort. Lipoma removal may also be done for cosmetic reasons. Tell a health care provider about: Any allergies you have. All medicines you are taking, including vitamins, herbs, eye drops, creams, and over-the-counter  medicines. Any problems you or family members have had with anesthetic medicines. Any blood disorders you have. Any surgeries you have had. Any medical conditions you have. Whether you are pregnant or may be pregnant. What are the risks? Generally, this is a safe procedure. However, problems may occur, including: Infection. Bleeding. Allergic reactions to medicines. Damage to nerves or blood vessels near the lipoma. Scarring.  Medicines Ask your health care provider about: Changing or stopping your regular medicines. This is especially important if you are taking diabetes medicines or blood thinners. Taking medicines such as aspirin and ibuprofen. These medicines can thin your blood. Do not take these medicines before your procedure if your health care provider instructs you not to. You may be given antibiotic medicine to help prevent infection. General instructions Ask your health care provider how your surgical site will be marked or identified. You will have a physical exam. Your health care provider will check the size of the lipoma and whether it can be moved easily.  What happens during the procedure? To reduce your risk of infection: Your health care team will wash or sanitize their hands. Your skin will be washed with surgical soap. You will be given the following: A medicine to numb the area (local anesthetic). An incision will be made over the lipoma or very near the lipoma. The incision may be made in a natural skin line or crease. Tissues, nerves, and  blood vessels near the lipoma will be moved out of the way. The lipoma and the capsule that surrounds it will be separated from the surrounding tissues. The lipoma will be removed. The incision may be closed with stitches and surgical glue

## 2023-04-26 NOTE — Progress Notes (Signed)
Excision of 2.6 cm soft tissue mass left latissimus dorsi area.  Pre-operative Diagnosis: Symptomatic less than 3 cm soft tissue mass left latissimus dorsi area.  Post-operative Diagnosis: same, with benign lipomatous tissue.  Surgeon: Campbell Lerner, M.D., FACS  Anesthesia: Local   Findings: Distant with benign lipomatous tissue  Estimated Blood Loss: 4 mL         Specimens: Equivalent to 2.6 cm of multilobulated lipomatous tissues.  Benign, discarded.          Complications: none              Procedure Details  The patient was evaluated, the benefits, complications, treatment options, and expected outcomes were discussed with the patient. The risks of bleeding, infection, recurrence of symptoms, failure to resolve symptoms, unanticipated injury, any of which could require further surgery were reviewed with the patient. The likelihood of improving the patient's symptoms with return to their baseline status is expected.  The patient and/or family concurred with the proposed plan, giving informed consent.  The patient was taken to our procedure room, identified and the procedure verified.    The patient was positioned in the prone position and the lateral latissimus area was prepped with  Chloraprep and draped in the sterile fashion.  A Time Out was held and the above information confirmed.  Local infiltration of 1% lidocaine with epinephrine is utilized until adequate anesthetic effect obtained.  An incision was made along skin lines over the palpable mass.  Incision was carried down through the subcutaneous tissues encountering the lipomatous lesion.  This was circumferentially excised to ensure adequate excision of all abnormal adipose tissue from the area, eliminating the mass.  Hemostasis was obtained.  Deep infiltration of local anesthesia was applied.  Incision was closed with interrupted dermal sutures of 3-0 Vicryl.  As the skin was well-approximated at that point was then sealed  with Dermabond.  Patient tolerated procedure well.

## 2023-05-07 NOTE — Progress Notes (Unsigned)
Frederick Amy, PA-C [address]  [address]  [place], [place] [address]  Main: [phone]  Fax: [phone]   Gastroenterology Consultation  Referring Provider:     Alba Cory, MD Primary Care Physician:  Frederick Cory, MD Primary Gastroenterologist:  Frederick Amy, PA-C / Frederick Nelson   Reason for Consultation:     Change in bowel habits        HPI:   Frederick Nelson is a 71 y.o. y/o male referred for consultation & management  by Frederick Cory, MD.    Current symptoms: Patient states he has had episodes of loose stools for a few years.  He had a TIA last year.  Was started on Plavix and Lipitor.  Has noticed increased loose stools since then.  Episodes occur once every few weeks.  Typically has 1 bowel movement daily.  When the episodes occur he has flushing with multiple watery bowel movements and loose stools which last 2 or 3 days.  Episodes are intermittent.  He has not had any abdominal pain, nausea, vomiting, rectal bleeding, or weight loss.  He has noticed certain food triggers such as nuts, milk, and salads.  He is concerned about food allergy.  He denies having any rashes.  Patient has history of colon cancer diagnosed in 2008 on Colonoscopy by Frederick Nelson.  Had a large mass at the hepatic flexure.  Underwent right hemicolectomy.  Last colonoscopy done by Frederick Nelson 08/2020 showed 1 small 6 mm sessile serrated polyp removed from hepatic flexure.  Excellent prep.  5-year repeat.  Also had colonoscopies done in 2016 (Normal - Frederick Nelson), 2011 (Negative), and 2008 (Colon Cancer Hepatic Flexure).  Has history of stroke and takes Plavix daily.  Past Medical History:  Diagnosis Date   Allergy    Arthritis    Benign hypertension with chronic kidney disease, stage III (HCC)    Chronic low back pain    CKD (chronic kidney disease), stage III (HCC)    Degenerative joint disease (DJD) of hip    Genital herpes    Takes Valtrex for prevention   GERD  (gastroesophageal reflux disease) 1995   Hyperlipidemia    Hypertension    Malignant neoplasm of ascending colon (HCC) 2011   Special screening for malignant neoplasms, colon 2011   Stroke [facility])     Past Surgical History:  Procedure Laterality Date   COLON SURGERY  06/26/2007   right hemicolectomy-T2N0 carcinoma of right colon. The tumor was 2.6 cm,low grade with zero of 14 nodes positive. No evidence of venous or lymphatic invasion   COLONOSCOPY  [id number]   COLONOSCOPY WITH PROPOFOL N/A 08/04/2015   Procedure: COLONOSCOPY WITH PROPOFOL;  Surgeon: Earline Mayotte, MD;  Location: [facility] ENDOSCOPY;  Service: Endoscopy;  Laterality: N/A;   COLONOSCOPY WITH PROPOFOL N/A 09/01/2020   Procedure: COLONOSCOPY WITH PROPOFOL;  Surgeon: Earline Mayotte, MD;  Location: [facility] ENDOSCOPY;  Service: Endoscopy;  Laterality: N/A;    Prior to Admission medications   Medication Sig Start Date End Date Taking? Authorizing Provider  Ascorbic Acid (VITA-C PO) Take 1 tablet by mouth daily.    [provider]  atorvastatin (LIPITOR) 20 MG tablet Take 1 tablet (20 mg total) by mouth daily. 04/17/23   Frederick Cory, MD  clopidogrel (PLAVIX) 75 MG tablet Take 1 tablet (75 mg total) by mouth daily. 04/17/23   Frederick Cory, MD  Flaxseed, Linseed, (FLAXSEED OIL) 1000 MG CAPS Take 1 capsule by mouth daily.  [provider]  Lemborexant (DAYVIGO) 10 MG TABS Take 1 tablet (10 mg total) by mouth at bedtime. 04/17/23   Frederick Cory, MD  loratadine (CLARITIN) 10 MG tablet Take 1 tablet (10 mg total) by mouth daily. 04/17/23   Frederick Cory, MD  sildenafil (VIAGRA) 100 MG tablet Take 0.5-1 tablets (50-100 mg total) by mouth daily as needed for erectile dysfunction. 04/17/23   Frederick Cory, MD  tamsulosin (FLOMAX) 0.4 MG CAPS capsule Take 1 capsule (0.4 mg total) by mouth daily. 04/17/23   Frederick Cory, MD  valACYclovir (VALTREX) 1000 MG tablet Take 1 tablet (1,000 mg total) by mouth daily.  04/17/23   Frederick Cory, MD  valsartan (DIOVAN) 160 MG tablet Take 1 tablet (160 mg total) by mouth daily. 04/17/23   Frederick Cory, MD  Vitamin D, Ergocalciferol, (DRISDOL) 1.25 MG (50000 UNIT) CAPS capsule Take 1 capsule (50,000 Units total) by mouth every 7 (seven) days. 04/17/23   Frederick Cory, MD    Family History  Problem Relation Age of Onset   Cancer Brother        cancer of the thymus   Diabetes Mother    Hypertension Father    Stroke Father    Cancer Maternal Aunt        breast cancer     Social History   Tobacco Use   Smoking status: Never    Passive exposure: Never   Smokeless tobacco: Never  Vaping Use   Vaping status: Never Used  Substance Use Topics   Alcohol use: Yes    Alcohol/week: 1.0 - 2.0 standard drink of alcohol    Types: 1 - 2 Standard drinks or equivalent per week    Comment: Socially only - Very little   Drug use: No    Allergies as of 05/08/2023 - Review Complete 05/08/2023  Allergen Reaction Noted   Ace inhibitors Swelling 11/01/2017   Ciprofloxacin  06/09/2015   Jardiance [empagliflozin] Rash 01/15/2023    Review of Systems:    All systems reviewed and negative except where noted in HPI.   Physical Exam:  BP 138/73   Pulse (!) 52   Temp 98.1 F (36.7 C)   Ht 5\' 11"  (1.803 m)   Wt 198 lb 3.2 oz (89.9 kg)   BMI 27.64 kg/m  No LMP for male patient.  General:   Alert,  Well-developed, well-nourished, pleasant and cooperative in NAD Lungs:  Respirations even and unlabored.  Clear throughout to auscultation.   No wheezes, crackles, or rhonchi. No acute distress. Heart:  Regular rate and rhythm; no murmurs, clicks, rubs, or gallops. Abdomen:  Normal bowel sounds.  No bruits.  Soft, and non-distended without masses, hepatosplenomegaly or hernias noted.  No Tenderness.  No guarding or rebound tenderness.    Neurologic:  Alert and oriented x3;  grossly normal neurologically. Psych:  Alert and cooperative. Normal mood and  affect.  Imaging Studies: No results found.  Assessment and Plan:   Frederick Nelson is a 72 y.o. y/o male presents for chronic intermittent episodes of loose stools.  I am suspicious for adverse side effect of medication (atorvastatin), versus adverse side effect of hemicolectomy.  Food allergies and IBS are also in the differential.  Episodes occur once every 2 weeks.  Between episodes stools are soft formed.  Has 1 bowel movement daily.  I am not suspicious for infectious pathogens.  1.  Change in bowel habits: Episodes of Diarrhea / Loose Stools  Labs: Celiac, Food Allergy Panal, Alpha  Gal  Start Fibercon take 2 tablets twice daily with large glass of water.  2.  History of colon cancer status post right hemicolectomy in 2008.  Follow-up colonoscopies in 2011, 2016, and 2022 showed NO recurrent cancer.   Follow up 6 weeks with TG.  Frederick Amy, PA-C

## 2023-05-08 ENCOUNTER — Encounter: Payer: Self-pay | Admitting: Physician Assistant

## 2023-05-08 ENCOUNTER — Ambulatory Visit: Payer: Medicare HMO | Admitting: Physician Assistant

## 2023-05-08 VITALS — BP 138/73 | HR 52 | Temp 98.1°F | Ht 71.0 in | Wt 198.2 lb

## 2023-05-08 DIAGNOSIS — R194 Change in bowel habit: Secondary | ICD-10-CM | POA: Diagnosis not present

## 2023-05-08 DIAGNOSIS — R195 Other fecal abnormalities: Secondary | ICD-10-CM | POA: Diagnosis not present

## 2023-05-08 DIAGNOSIS — R197 Diarrhea, unspecified: Secondary | ICD-10-CM | POA: Diagnosis not present

## 2023-05-11 LAB — FOOD ALLERGY PROFILE
Allergen Corn, IgE: <0.1 kU/L
Clam IgE: <0.1 kU/L
Codfish IgE: <0.1 kU/L
Egg White IgE: <0.1 kU/L
Milk IgE: <0.1 kU/L
Peanut IgE: <0.1 kU/L
Scallop IgE: <0.1 kU/L
Sesame Seed IgE: <0.1 kU/L
Shrimp IgE: <0.1 kU/L
Soybean IgE: <0.1 kU/L
Walnut IgE: <0.1 kU/L
Wheat IgE: <0.1 kU/L

## 2023-05-11 LAB — CELIAC DISEASE AB SCREEN W/RFX
Antigliadin Abs, IgA: 3 U (ref 0–19)
IgA/Immunoglobulin A, Serum: 143 mg/dL (ref 61–437)
Transglutaminase IgA: <2 U/mL (ref 0–3)

## 2023-05-11 LAB — ALPHA-GAL PANEL
Allergen Lamb IgE: <0.1 kU/L
Beef IgE: <0.1 kU/L
IgE (Immunoglobulin E), Serum: 228 [IU]/mL (ref 6–495)
O215-IgE Alpha-Gal: <0.1 kU/L
Pork IgE: <0.1 kU/L

## 2023-06-19 ENCOUNTER — Ambulatory Visit: Payer: Medicare HMO | Admitting: Physician Assistant

## 2023-06-19 ENCOUNTER — Encounter: Payer: Self-pay | Admitting: Physician Assistant

## 2023-06-19 VITALS — BP 160/76 | HR 51 | Temp 97.9°F | Ht 71.0 in | Wt 201.4 lb

## 2023-06-19 DIAGNOSIS — K529 Noninfective gastroenteritis and colitis, unspecified: Secondary | ICD-10-CM

## 2023-06-19 DIAGNOSIS — Z860100 Personal history of colon polyps, unspecified: Secondary | ICD-10-CM

## 2023-06-19 DIAGNOSIS — K580 Irritable bowel syndrome with diarrhea: Secondary | ICD-10-CM

## 2023-06-19 DIAGNOSIS — Z85038 Personal history of other malignant neoplasm of large intestine: Secondary | ICD-10-CM

## 2023-06-19 DIAGNOSIS — R195 Other fecal abnormalities: Secondary | ICD-10-CM

## 2023-06-19 NOTE — Progress Notes (Signed)
Celso Amy, PA-C [address]  [address]  [place], [place] [address]  Main: [phone]  Fax: [phone]   Primary Care Physician: Alba Cory, MD  Primary Gastroenterologist:  Celso Amy, PA-C   CC: F/U Loose stools  HPI: Frederick Nelson is a 72 y.o. male returns for 6-week follow-up of loose stools.  History of chronic loose stools for a few years after he was started on Lipitor and Plavix for TIA.  Intermittent episodes of loose stool every 2 weeks.  Was started on FiberCon tablets 6 weeks ago with benefit.  Stools are more formed and normal.  Typically has 1 bowel movement daily.  Now he only notices loose stools if he eats salads.  FiberCon has helped.  Labs 05/08/2023 showed negative alpha gal, negative celiac panel, negative food allergy panel.  No recent stool studies.  Labs 12/2022 showed normal CBC and CMP, except mild CKD with GFR 48.  Lab 08/2022 showed normal TSH.  History of colon cancer diagnosed in 2008.  Underwent right hemicolectomy for mass at the hepatic flexure.  Last colonoscopy done by Dr. Lemar Livings 08/2020 showed 1 small 6 mm sessile serrated polyp removed from hepatic flexure.  Excellent prep.  5-year repeat.  He had negative colonoscopies done in 2016 and 2011.  Current Outpatient Medications  Medication Sig Dispense Refill   Ascorbic Acid (VITA-C PO) Take 1 tablet by mouth daily.     atorvastatin (LIPITOR) 20 MG tablet Take 1 tablet (20 mg total) by mouth daily. 90 tablet 1   clopidogrel (PLAVIX) 75 MG tablet Take 1 tablet (75 mg total) by mouth daily. 90 tablet 1   Lemborexant (DAYVIGO) 10 MG TABS Take 1 tablet (10 mg total) by mouth at bedtime. 90 tablet 1   loratadine (CLARITIN) 10 MG tablet Take 1 tablet (10 mg total) by mouth daily. 90 tablet 1   sildenafil (VIAGRA) 100 MG tablet Take 0.5-1 tablets (50-100 mg total) by mouth daily as needed for erectile dysfunction. 30 tablet 0   tamsulosin (FLOMAX) 0.4 MG CAPS capsule Take 1 capsule (0.4  mg total) by mouth daily. 90 capsule 1   valACYclovir (VALTREX) 1000 MG tablet Take 1 tablet (1,000 mg total) by mouth daily. 90 tablet 1   valsartan (DIOVAN) 160 MG tablet Take 1 tablet (160 mg total) by mouth daily. 90 tablet 1   Vitamin D, Ergocalciferol, (DRISDOL) 1.25 MG (50000 UNIT) CAPS capsule Take 1 capsule (50,000 Units total) by mouth every 7 (seven) days. 12 capsule 1   No current facility-administered medications for this visit.    Allergies as of 06/19/2023 - Review Complete 06/19/2023  Allergen Reaction Noted   Ace inhibitors Swelling 11/01/2017   Ciprofloxacin  06/09/2015   Jardiance [empagliflozin] Rash 01/15/2023    Past Medical History:  Diagnosis Date   Allergy    Arthritis    Benign hypertension with chronic kidney disease, stage III (HCC)    Chronic low back pain    CKD (chronic kidney disease), stage III (HCC)    Degenerative joint disease (DJD) of hip    Genital herpes    Takes Valtrex for prevention   GERD (gastroesophageal reflux disease) 1995   Hyperlipidemia    Hypertension    Malignant neoplasm of ascending colon (HCC) 2011   Special screening for malignant neoplasms, colon 2011   Stroke [facility])     Past Surgical History:  Procedure Laterality Date   COLON SURGERY  06/26/2007   right hemicolectomy-T2N0 carcinoma of right  colon. The tumor was 2.6 cm,low grade with zero of 14 nodes positive. No evidence of venous or lymphatic invasion   COLONOSCOPY  [id number]   COLONOSCOPY WITH PROPOFOL N/A 08/04/2015   Procedure: COLONOSCOPY WITH PROPOFOL;  Surgeon: Earline Mayotte, MD;  Location: [facility] ENDOSCOPY;  Service: Endoscopy;  Laterality: N/A;   COLONOSCOPY WITH PROPOFOL N/A 09/01/2020   Procedure: COLONOSCOPY WITH PROPOFOL;  Surgeon: Earline Mayotte, MD;  Location: [facility] ENDOSCOPY;  Service: Endoscopy;  Laterality: N/A;    Review of Systems:    All systems reviewed and negative except where noted in HPI.   Physical Examination:   BP (!) 160/76    Pulse (!) 51   Temp 97.9 F (36.6 C)   Ht 5\' 11"  (1.803 m)   Wt 201 lb 6.4 oz (91.4 kg)   BMI 28.09 kg/m   General: Well-nourished, well-developed in no acute distress.  Neuro: Alert and oriented x 3.  Grossly intact.  Psych: Alert and cooperative, normal mood and affect.   Imaging Studies: No results found.  Assessment and Plan:   Frederick Nelson is a 72 y.o. y/o male returns for follow-up of chronic loose stools.  Recent labs showed negative alpha gal, negative celiac, negative food allergy panel.  Episodes of loose stool occur once every 2 weeks.  Currently taking FiberCon tablets with benefit.  History of colon cancer diagnosed in 2008, s/p right hemicolectomy.  Negative colonoscopies in 2011 and 2016.  Last colonoscopy in 2022 showed 1 small polyp removed, 5-year repeat will be due in 2027.  1.  Intermittent loose stools  Continue FiberCon tablets daily  Reassurance  We discussed low FODMAP diet.  Also discussed avoiding trigger foods such as salads.  2.  Remote history of colon cancer (Dx 2008).  Last colonoscopy in 2022.  5-year repeat colonoscopy will be due in 2027.  Celso Amy, PA-C  Follow up as needed if recurrent or worsening GI symptoms.

## 2023-06-25 ENCOUNTER — Encounter: Payer: Medicare HMO | Admitting: Family Medicine

## 2023-06-26 NOTE — Progress Notes (Unsigned)
Name: Frederick Nelson   MRN: [id number]    DOB: 10/17/1950   Date:06/27/2023       Progress Note  Subjective  Chief Complaint  Annual Exam  HPI  Patient presents for annual CPE.  IPSS Questionnaire (AUA-7): Over the past month.   1)  How often have you had a sensation of not emptying your bladder completely after you finish urinating?  2 - Less than half the time  2)  How often have you had to urinate again less than two hours after you finished urinating? 3 - About half the time  3)  How often have you found you stopped and started again several times when you urinated?  1 - Less than 1 time in 5  4) How difficult have you found it to postpone urination?  3 - About half the time  5) How often have you had a weak urinary stream?  1 - Less than 1 time in 5  6) How often have you had to push or strain to begin urination?  1 - Less than 1 time in 5  7) How many times did you most typically get up to urinate from the time you went to bed until the time you got up in the morning?  4 - 4 times  Total score:  0-7 mildly symptomatic   8-19 moderately symptomatic   20-35 severely symptomatic     Diet: eating more salads , avoiding sweets and bread  Exercise: continue regular physical activity  Last Dental Exam: up to date  Last Eye Exam: up to date   Depression: phq 9 is negative    06/27/2023   10:12 AM 04/17/2023   11:09 AM 01/15/2023   11:04 AM 10/16/2022   10:11 AM 09/06/2022    8:09 AM  Depression screen PHQ 2/9  Decreased Interest 0 0 1 0 0  Down, Depressed, Hopeless 0 0 0 0 0  PHQ - 2 Score 0 0 1 0 0  Altered sleeping 2 0 2 1 1   Tired, decreased energy 1 0 1 1 1   Change in appetite 0 0 0 0 0  Feeling bad or failure about yourself  0 0 0 0 0  Trouble concentrating 0 0 0 0 0  Moving slowly or fidgety/restless 0 0 0 0 0  Suicidal thoughts 0 0 0 0 0  PHQ-9 Score 3 0 4 2 2   Difficult doing work/chores Somewhat difficult  Not difficult at all Not difficult at all      Hypertension:  BP Readings from Last 3 Encounters:  06/27/23 (!) 152/76  06/19/23 (!) 160/76  05/08/23 138/73    Obesity: Wt Readings from Last 3 Encounters:  06/27/23 198 lb 11.2 oz (90.1 kg)  06/19/23 201 lb 6.4 oz (91.4 kg)  05/08/23 198 lb 3.2 oz (89.9 kg)   BMI Readings from Last 3 Encounters:  06/27/23 27.14 kg/m  06/19/23 28.09 kg/m  05/08/23 27.64 kg/m     Lipids:  Lab Results  Component Value Date   CHOL 112 04/14/2022   CHOL 190 01/17/2022   CHOL 197 04/29/2021   Lab Results  Component Value Date   HDL 39 (L) 04/14/2022   HDL 50 01/17/2022   HDL 50 04/29/2021   Lab Results  Component Value Date   LDLCALC 56 04/14/2022   LDLCALC 110 (H) 01/17/2022   LDLCALC 128 (H) 04/29/2021   Lab Results  Component Value Date   TRIG 86 04/14/2022   TRIG  148 01/17/2022   TRIG 93 04/29/2021   Lab Results  Component Value Date   CHOLHDL 2.9 04/14/2022   CHOLHDL 3.8 01/17/2022   CHOLHDL 3.9 04/29/2021   No results found for: "LDLDIRECT" Glucose:  Glucose, Bld  Date Value Ref Range Status  01/05/2023 95 70 - 99 mg/dL Final    Comment:    Glucose reference range applies only to samples taken after fasting for at least 8 hours.  09/06/2022 103 (H) 65 - 99 mg/dL Final    Comment:    .            Fasting reference interval . For someone without known diabetes, a glucose value between 100 and 125 mg/dL is consistent with prediabetes and should be confirmed with a follow-up test. .   06/15/2022 87 65 - 99 mg/dL Final    Comment:    .            Fasting reference interval .    Glucose-Capillary  Date Value Ref Range Status  01/16/2022 116 (H) 70 - 99 mg/dL Final    Comment:    Glucose reference range applies only to samples taken after fasting for at least 8 hours.    Flowsheet Row Clinical Support from 07/28/2021 in [facility]  AUDIT-C Score 1       Divorced STD testing and prevention (HIV/chl/gon/syphilis):  same partner  Sexual history: he has ED and takes viagra - does not work great - he saw Insurance underwriter and was given injections but he did not like it - too painful , pump did not work either  Hep C Screening: 05/29/17 Skin cancer: Discussed monitoring for atypical lesions Colorectal cancer: 09/01/20, repeat in 2027  Prostate cancer:  no family history of prostate cancer, discussed USPTF, he has LUTS seen by Urologist   Lab Results  Component Value Date   PSA 1.29 06/15/2022   PSA 1.25 04/29/2021   PSA 0.9 07/02/2018     Lung cancer:  Low Dose CT Chest recommended if Age 62-80 years, 30 pack-year currently smoking OR have quit w/in 15years. Patient  is not a candidate for screening   AAA: The USPSTF recommends one-time screening with ultrasonography in men ages 14 to 75 years who have ever smoked. Patient  is not a candidate for screening  ECG:  01/08/23  Vaccines:   RSV: discussed with patient  Tdap: 2014, due and he will go to the local pharmacy  Shingrix: he will get it at local pharmacy  Pneumonia: up to date Flu: due COVID-19: up to date  Advanced Care Planning: A voluntary discussion about advance care planning including the explanation and discussion of advance directives.  Discussed health care proxy and Living will, and the patient was able to identify a health care proxy as daughter - Lucile Shutters .  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    Monoclonal gammopathy 07/13/2021   Chronic kidney disease 06/06/2021   Essential hypertension 06/06/2021  Sinus bradycardia 06/19/2018   Hyperlipidemia 11/02/2017   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 The tumor was 2.6 cm,low grade with zero of 14 nodes positive. No evidence of venous or lymphatic invasion   COLONOSCOPY  [id number]   COLONOSCOPY WITH PROPOFOL N/A 08/04/2015   Procedure: COLONOSCOPY WITH PROPOFOL;  Surgeon: Earline Mayotte, MD;  Location: [facility] ENDOSCOPY;  Service: Endoscopy;  Laterality: N/A;   COLONOSCOPY WITH PROPOFOL N/A 09/01/2020   Procedure: COLONOSCOPY WITH PROPOFOL;  Surgeon: Earline Mayotte, MD;  Location: [facility] ENDOSCOPY;  Service: Endoscopy;  Laterality: N/A;    Family History  Problem Relation Age of Onset   Cancer Brother        cancer of the thymus   Diabetes Mother    Hypertension Father    Stroke Father    Cancer Maternal Aunt        breast cancer    Social History   Socioeconomic History   Marital status: Divorced    Spouse name: Not on file   Number of children: 1   Years of education: 12   Highest education level: Some college, no degree  Occupational History   Not on file  Tobacco Use   Smoking status: Never    Passive exposure: Never   Smokeless tobacco: Never  Vaping Use   Vaping status: Never Used  Substance and Sexual Activity   Alcohol use: Yes    Alcohol/week: 1.0 - 2.0 standard drink of alcohol    Types: 1 - 2 Standard drinks or equivalent per week    Comment:  Socially only - Very little   Drug use: No   Sexual activity: Yes    Birth control/protection: None  Other Topics Concern   Not on file  Social History Narrative   Pt lives alone; lives in [place]; worked in Acupuncturist; 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 Non-smokers; Hx of marijuana smoking- when young; rare alcohol.    Social Determinants of Health   Financial Resource Strain: Low Risk  (06/15/2022)   Overall Financial Resource Strain (CARDIA)    Difficulty of Paying Living Expenses: Not hard at all  Food Insecurity: No Food Insecurity (06/15/2022)   Hunger Vital Sign    Worried About Running Out of Food in the Last Year: Never true    Ran Out of Food in the Last Year: Never true  Transportation Needs: No Transportation Needs (06/15/2022)   PRAPARE - Administrator, Civil Service (Medical): No    Lack of Transportation (Non-Medical): No  Physical Activity: Insufficiently Active (06/15/2022)   Exercise Vital Sign    Days of Exercise per Week: 1 day    Minutes of Exercise per Session: 20 min  Stress: Stress Concern Present (06/15/2022)   [employer] of Occupational Health - Occupational Stress Questionnaire    Feeling of Stress : To some extent  Social Connections: Moderately Isolated (06/15/2022)   Social Connection and Isolation Panel [NHANES]    Frequency of Communication with Friends and Family: More than three times a week    Frequency  of Social Gatherings with Friends and Family: Twice a week    Attends Religious Services: Never    Database administrator or Organizations: Yes    Attends Engineer, structural: More than 4 times per year    Marital Status: Divorced  Intimate Partner Violence: Not At Risk (06/15/2022)   Humiliation, Afraid, Rape, and Kick questionnaire    Fear of Current or Ex-Partner: No    Emotionally Abused: No    Physically Abused: No    Sexually Abused: No     Current Outpatient Medications:    Ascorbic Acid (VITA-C PO), Take 1  tablet by mouth daily., Disp: , Rfl:    atorvastatin (LIPITOR) 20 MG tablet, Take 1 tablet (20 mg total) by mouth daily., Disp: 90 tablet, Rfl: 1   clopidogrel (PLAVIX) 75 MG tablet, Take 1 tablet (75 mg total) by mouth daily., Disp: 90 tablet, Rfl: 1   Lemborexant (DAYVIGO) 10 MG TABS, Take 1 tablet (10 mg total) by mouth at bedtime., Disp: 90 tablet, Rfl: 1   loratadine (CLARITIN) 10 MG tablet, Take 1 tablet (10 mg total) by mouth daily., Disp: 90 tablet, Rfl: 1   sildenafil (VIAGRA) 100 MG tablet, Take 0.5-1 tablets (50-100 mg total) by mouth daily as needed for erectile dysfunction., Disp: 30 tablet, Rfl: 0   tamsulosin (FLOMAX) 0.4 MG CAPS capsule, Take 1 capsule (0.4 mg total) by mouth daily., Disp: 90 capsule, Rfl: 1   valACYclovir (VALTREX) 1000 MG tablet, Take 1 tablet (1,000 mg total) by mouth daily., Disp: 90 tablet, Rfl: 1   valsartan (DIOVAN) 160 MG tablet, Take 1 tablet (160 mg total) by mouth daily., Disp: 90 tablet, Rfl: 1   Vitamin D, Ergocalciferol, (DRISDOL) 1.25 MG (50000 UNIT) CAPS capsule, Take 1 capsule (50,000 Units total) by mouth every 7 (seven) days., Disp: 12 capsule, Rfl: 1  Allergies  Allergen Reactions   Ace Inhibitors Swelling   Ciprofloxacin     Tendon damage   Jardiance [Empagliflozin] Rash     ROS  Constitutional: Negative for fever or weight change.  Respiratory: Negative for cough and shortness of breath.   Cardiovascular: Negative for chest pain or palpitations.  Gastrointestinal: Negative for abdominal pain, diarrhea is improving, seen by GI  Musculoskeletal: Negative for gait problem or joint swelling.  Skin: Negative for rash.  Neurological: Negative for dizziness or headache.  No other specific complaints in a complete review of systems (except as listed in HPI above).    Objective  Vitals:   06/27/23 1010  BP: (!) 152/76  Pulse: 63  Resp: 16  Temp: 97.9 F (36.6 C)  TempSrc: Oral  SpO2: 99%  Weight: 198 lb 11.2 oz (90.1 kg)   Height: 5' 11.75" (1.822 m)    Body mass index is 27.14 kg/m.  Physical Exam  Constitutional: Patient appears well-developed and well-nourished. No distress.  HENT: Head: Normocephalic and atraumatic. Ears: B TMs ok, no erythema or effusion; Nose: Nose normal. Mouth/Throat: Oropharynx is clear and moist. No oropharyngeal exudate.  Eyes: Conjunctivae and EOM are normal. Pupils are equal, round, and reactive to light. No scleral icterus.  Neck: Normal range of motion. Neck supple. No JVD present. No thyromegaly present.  Cardiovascular: Normal rate, regular rhythm and normal heart sounds.  No murmur heard. No BLE edema. Pulmonary/Chest: Effort normal and breath sounds normal. No respiratory distress. Abdominal: Soft. Bowel sounds are normal, no distension. There is no tenderness. no masses MALE GENITALIA: Normal descended testes bilaterally, no masses palpated,  no hernias, no lesions, no discharge RECTAL: N/A Musculoskeletal: Normal range of motion, no joint effusions. No gross deformities Neurological: he is alert and oriented to person, place, and time. No cranial nerve deficit. Coordination, balance, strength, speech and gait are normal.  Skin: Skin is warm and dry. Tinea versicolor on her back.  No erythema.  Psychiatric: Patient has a normal mood and affect. behavior is normal. Judgment and thought content normal.   Recent Results (from the past 2160 hour(s))  POCT HgB A1C     Status: Abnormal   Collection Time: 04/17/23 11:11 AM  Result Value Ref Range   Hemoglobin A1C 6.3 (A) 4.0 - 5.6 %   HbA1c POC (<> result, manual entry)     HbA1c, POC (prediabetic range)     HbA1c, POC (controlled diabetic range)    Alpha-Gal Panel     Status: None   Collection Time: 05/08/23  9:26 AM  Result Value Ref Range   Class Description Allergens Comment     Comment:     Levels of Specific IgE       Class  Description of Class     ---------------------------  -----  --------------------                     < 0.10         0         Negative            0.10 -    0.31         0/I       Equivocal/Low            0.32 -    0.55         I         Low            0.56 -    1.40         II        Moderate            1.41 -    3.90         III       High            3.91 -   19.00         IV        Very High           19.01 -  100.00         V         Very High                   >100.00         VI        Very High    IgE (Immunoglobulin E), Serum 228 6 - 495 IU/mL   Pork IgE <0.10 Class 0 kU/L   Beef IgE <0.10 Class 0 kU/L   Allergen Lamb IgE <0.10 Class 0 kU/L   O215-IgE Alpha-Gal <0.10 Class 0 kU/L  Celiac Disease Ab Screen w/Rfx     Status: None   Collection Time: 05/08/23  9:26 AM  Result Value Ref Range   Antigliadin Abs, IgA 3 0 - 19 units    Comment:                    Negative  0 - 19                    Weak Positive             20 - 30                    Moderate to Strong Positive   >30    Transglutaminase IgA <2 0 - 3 U/mL    Comment:                               Negative        0 -  3                               Weak Positive   4 - 10                               Positive           >10  Tissue Transglutaminase (tTG) has been identified  as the endomysial antigen.  Studies have demonstr-  ated that endomysial IgA antibodies have over 99%  specificity for gluten sensitive enteropathy.    IgA/Immunoglobulin A, Serum 143 61 - 437 mg/dL  Food Allergy Profile     Status: None   Collection Time: 05/08/23  9:26 AM  Result Value Ref Range   Egg White IgE <0.10 Class 0 kU/L   Peanut IgE <0.10 Class 0 kU/L   Soybean IgE <0.10 Class 0 kU/L   Milk IgE <0.10 Class 0 kU/L   Clam IgE <0.10 Class 0 kU/L   Shrimp IgE <0.10 Class 0 kU/L   Walnut IgE <0.10 Class 0 kU/L   Codfish IgE <0.10 Class 0 kU/L   Scallop IgE <0.10 Class 0 kU/L   Wheat IgE <0.10 Class 0 kU/L   Allergen Corn, IgE <0.10 Class 0 kU/L   Sesame Seed IgE <0.10 Class 0 kU/L     Fall  Risk:    06/27/2023   10:12 AM 04/17/2023   11:09 AM 01/15/2023   11:03 AM 10/16/2022   10:09 AM 09/06/2022    8:04 AM  Fall Risk   Falls in the past year? 1 1 1  0 0  Number falls in past yr: 0 0 0  0  Injury with Fall? 0 0 0  0  Risk for fall due to : History of fall(s) History of fall(s) History of fall(s) No Fall Risks No Fall Risks  Follow up Falls evaluation completed;Education provided;Falls prevention discussed Falls evaluation completed;Education provided;Falls prevention discussed Falls prevention discussed;Education provided;Falls evaluation completed Falls prevention discussed;Education provided;Falls evaluation completed Falls prevention discussed     Functional Status Survey: Is the patient deaf or have difficulty hearing?: Yes Does the patient have difficulty seeing, even when wearing glasses/contacts?: No Does the patient have difficulty concentrating, remembering, or making decisions?: Yes Does the patient have difficulty walking or climbing stairs?: No Does the patient have difficulty dressing or bathing?: No Does the patient have difficulty doing errands alone such as visiting a doctor's office or shopping?: No    Assessment & Plan  1. Well adult exam  Discussed vaccines to get at local pharmacy     -Prostate cancer screening and PSA options (with potential risks and benefits of testing vs not testing)  were discussed along with recent recs/guidelines. -USPSTF grade A and B recommendations reviewed with patient; age-appropriate recommendations, preventive care, screening tests, etc discussed and encouraged; healthy living encouraged; see AVS for patient education given to patient -Discussed importance of 150 minutes of physical activity weekly, eat two servings of fish weekly, eat one serving of tree nuts ( cashews, pistachios, pecans, almonds.Marland Kitchen) every other day, eat 6 servings of fruit/vegetables daily and drink plenty of water and avoid sweet beverages.  -Reviewed  Health Maintenance: yes

## 2023-06-27 ENCOUNTER — Ambulatory Visit (INDEPENDENT_AMBULATORY_CARE_PROVIDER_SITE_OTHER): Payer: Medicare HMO | Admitting: Family Medicine

## 2023-06-27 ENCOUNTER — Encounter: Payer: Self-pay | Admitting: Family Medicine

## 2023-06-27 VITALS — BP 148/78 | HR 63 | Temp 97.9°F | Resp 16 | Ht 71.75 in | Wt 198.7 lb

## 2023-06-27 DIAGNOSIS — Z23 Encounter for immunization: Secondary | ICD-10-CM

## 2023-06-27 DIAGNOSIS — Z9049 Acquired absence of other specified parts of digestive tract: Secondary | ICD-10-CM | POA: Insufficient documentation

## 2023-06-27 DIAGNOSIS — Z0000 Encounter for general adult medical examination without abnormal findings: Secondary | ICD-10-CM | POA: Diagnosis not present

## 2023-06-27 NOTE — Patient Instructions (Signed)
Go to your pharmacy for Tdap, RSV and shingrix

## 2023-07-04 ENCOUNTER — Encounter: Payer: Self-pay | Admitting: Family Medicine

## 2023-07-31 ENCOUNTER — Other Ambulatory Visit: Payer: Self-pay | Admitting: Family Medicine

## 2023-07-31 DIAGNOSIS — Z8673 Personal history of transient ischemic attack (TIA), and cerebral infarction without residual deficits: Secondary | ICD-10-CM

## 2023-08-01 NOTE — Telephone Encounter (Signed)
Pt already have appt for March. Dr Carlynn Purl approved 3 month supply with 1 refill

## 2023-08-01 NOTE — Telephone Encounter (Signed)
Needs sooner appt was suppose to f/u in 4 months

## 2023-08-07 ENCOUNTER — Ambulatory Visit: Payer: Medicare HMO

## 2023-09-06 ENCOUNTER — Ambulatory Visit (INDEPENDENT_AMBULATORY_CARE_PROVIDER_SITE_OTHER): Payer: Medicare HMO

## 2023-09-06 ENCOUNTER — Telehealth: Payer: Self-pay

## 2023-09-06 DIAGNOSIS — Z0000 Encounter for general adult medical examination without abnormal findings: Secondary | ICD-10-CM

## 2023-09-06 NOTE — Patient Instructions (Addendum)
 Frederick Nelson , Thank you for taking time to come for your Medicare Wellness Visit. I appreciate your ongoing commitment to your health goals. Please review the following plan we discussed and let me know if I can assist you in the future.   Referrals/Orders/Follow-Ups/Clinician Recommendations: none  This is a list of the screening recommended for you and due dates:  Health Maintenance  Topic Date Due   Zoster (Shingles) Vaccine (1 of 2) Never done   COVID-19 Vaccine (3 - 2024-25 season) 04/29/2023   Medicare Annual Wellness Visit  09/05/2024   Colon Cancer Screening  09/01/2025   Pneumonia Vaccine  Completed   Flu Shot  Completed   Hepatitis C Screening  Completed   HPV Vaccine  Aged Out   DTaP/Tdap/Td vaccine  Discontinued    Advanced directives: (ACP Link)Information on Advanced Care Planning can be found at [place]  Secretary of [facility] Advance Health Care Directives Advance Health Care Directives ([email or web address]   Next Medicare Annual Wellness Visit scheduled for next year: Yes    09/11/24 @ 8:50 AM BY VIDEO

## 2023-09-06 NOTE — Telephone Encounter (Signed)
 I tried to call pt twice and was told I had the wrong #. I sent pt a MyChart message about an appt.

## 2023-09-06 NOTE — Telephone Encounter (Signed)
-----   Message from Dorette JULIANNA Loron sent at 09/06/2023 10:32 AM EST ----- Regarding: FW: MED Can he come sooner for follow up ----- Message ----- From: Gwenn Jhonnie RAMAN, LPN Sent: 8/0/7974   9:09 AM EST To: Dorette Loron, MD Subject: MED                                            Hi Dr.Sowles, I did his AWV this morning. He is requesting a new prescription for valsartan  since the dosing has been changed to 1 1/2 pills daily. He said he will run out under the current prescription. Thank you, Lorrie

## 2023-09-06 NOTE — Progress Notes (Signed)
 Subjective:   Frederick Nelson is a 73 y.o. male who presents for Medicare Annual/Subsequent preventive examination.  Visit Complete: Virtual I connected with  Frederick Nelson on 09/06/23 by a video and audio enabled telemedicine application and verified that I am speaking with the correct person using two identifiers.  Patient Location: Home  Provider Location: Office/Clinic  I discussed the limitations of evaluation and management by telemedicine. The patient expressed understanding and agreed to proceed.  Vital Signs: Because this visit was a virtual/telehealth visit, some criteria may be missing or patient reported. Any vitals not documented were not able to be obtained and vitals that have been documented are patient reported.    Cardiac Risk Factors include: advanced age (>42men, >74 women);hypertension;male gender     Objective:    There were no vitals filed for this visit. There is no height or weight on file to calculate BMI.     09/06/2023    8:58 AM 01/05/2023    4:41 PM 04/12/2022    3:33 PM 01/17/2022    9:00 AM 01/16/2022    8:51 PM 07/28/2021    9:30 AM 09/01/2020    8:14 AM  Advanced Directives  Does Patient Have a Medical Advance Directive? No No No No No No No  Would patient like information on creating a medical advance directive? No - Patient declined   No - Patient declined  Yes (MAU/Ambulatory/Procedural Areas - Information given) No - Patient declined    Current Medications (verified) Outpatient Encounter Medications as of 09/06/2023  Medication Sig   Ascorbic Acid  (VITA-C PO) Take 1 tablet by mouth daily.   atorvastatin  (LIPITOR) 20 MG tablet TAKE 1 TABLET BY MOUTH EVERY DAY   clopidogrel  (PLAVIX ) 75 MG tablet TAKE 1 TABLET BY MOUTH EVERY DAY   Lemborexant  (DAYVIGO ) 10 MG TABS Take 1 tablet (10 mg total) by mouth at bedtime.   loratadine  (CLARITIN ) 10 MG tablet Take 1 tablet (10 mg total) by mouth daily.   sildenafil  (VIAGRA ) 100 MG tablet Take 0.5-1  tablets (50-100 mg total) by mouth daily as needed for erectile dysfunction.   tamsulosin  (FLOMAX ) 0.4 MG CAPS capsule Take 1 capsule (0.4 mg total) by mouth daily.   valACYclovir  (VALTREX ) 1000 MG tablet Take 1 tablet (1,000 mg total) by mouth daily.   valsartan  (DIOVAN ) 160 MG tablet Take 1 tablet (160 mg total) by mouth daily.   Vitamin D , Ergocalciferol , (DRISDOL ) 1.25 MG (50000 UNIT) CAPS capsule Take 1 capsule (50,000 Units total) by mouth every 7 (seven) days.   No facility-administered encounter medications on file as of 09/06/2023.    Allergies (verified) Ace inhibitors, Ciprofloxacin, and Jardiance  [empagliflozin ]   History: Past Medical History:  Diagnosis Date   Allergy     Arthritis    Benign hypertension with chronic kidney disease, stage III (HCC)    Chronic low back pain    CKD (chronic kidney disease), stage III (HCC)    Degenerative joint disease (DJD) of hip    Genital herpes    Takes Valtrex  for prevention   GERD (gastroesophageal reflux disease) 1995   Hyperlipidemia    Hypertension    Malignant neoplasm of ascending colon (HCC) 2011   Special screening for malignant neoplasms, colon 2011   Stroke [facility])    Past Surgical History:  Procedure Laterality Date   COLON SURGERY  06/26/2007   right hemicolectomy-T2N0 carcinoma of right colon. The tumor was 2.6 cm,low grade with zero of 14 nodes positive. No evidence of  venous or lymphatic invasion   COLONOSCOPY  [id number]   COLONOSCOPY WITH PROPOFOL  N/A 08/04/2015   Procedure: COLONOSCOPY WITH PROPOFOL ;  Surgeon: Frederick Nelson Cota, MD;  Location: [facility] ENDOSCOPY;  Service: Endoscopy;  Laterality: N/A;   COLONOSCOPY WITH PROPOFOL  N/A 09/01/2020   Procedure: COLONOSCOPY WITH PROPOFOL ;  Surgeon: Nelson Frederick LELON, MD;  Location: [facility] ENDOSCOPY;  Service: Endoscopy;  Laterality: N/A;   Family History  Problem Relation Age of Onset   Cancer Brother        cancer of the thymus   Diabetes Mother    Hypertension Father     Stroke Father    Cancer Maternal Aunt        breast cancer   Social History   Socioeconomic History   Marital status: Divorced    Spouse name: Not on file   Number of children: 1   Years of education: 12   Highest education level: Some college, no degree  Occupational History   Not on file  Tobacco Use   Smoking status: Never    Passive exposure: Never   Smokeless tobacco: Never  Vaping Use   Vaping status: Never Used  Substance and Sexual Activity   Alcohol use: Yes    Alcohol/week: 1.0 - 2.0 standard drink of alcohol    Types: 1 - 2 Standard drinks or equivalent per week    Comment: Socially only - Very little   Drug use: No   Sexual activity: Yes    Birth control/protection: None  Other Topics Concern   Not on file  Social History Narrative   Pt lives alone; lives in [place]; worked in acupuncturist; 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 Non-smokers; Hx of marijuana smoking- when young; rare alcohol.    Social Drivers of Corporate Investment Banker Strain: Low Risk  (09/06/2023)   Overall Financial Resource Strain (CARDIA)    Difficulty of Paying Living Expenses: Not hard at all  Food Insecurity: No Food Insecurity (09/06/2023)   Hunger Vital Sign    Worried About Running Out of Food in the Last Year: Never true    Ran Out of Food in the Last Year: Never true  Transportation Needs: No Transportation Needs (09/06/2023)   PRAPARE - Administrator, Civil Service (Medical): No    Lack of Transportation (Non-Medical): No  Physical Activity: Insufficiently Active (09/06/2023)   Exercise Vital Sign    Days of Exercise per Week: 2 days    Minutes of Exercise per Session: 30 min  Stress: No Stress Concern Present (09/06/2023)   [employer] of Occupational Health - Occupational Stress Questionnaire    Feeling of Stress : Not at all  Social Connections: Moderately Integrated (09/06/2023)   Social Connection and Isolation Panel [NHANES]    Frequency of Communication with Friends  and Family: More than three times a week    Frequency of Social Gatherings with Friends and Family: Twice a week    Attends Religious Services: 1 to 4 times per year    Active Member of [employer] or Organizations: Yes    Attends Engineer, Structural: More than 4 times per year    Marital Status: Divorced    Tobacco Counseling Counseling given: Not Answered   Clinical Intake:  Pre-visit preparation completed: Yes  Pain : No/denies pain     Nutritional Risks: None Diabetes: No  How often do you need to have someone help you when you read instructions, pamphlets, or other written materials from your doctor  or pharmacy?: 1 - Never  Interpreter Needed?: No  Information entered by :: JHONNIE DAS, LPN   Activities of Daily Living    09/06/2023    8:59 AM 06/27/2023   10:12 AM  In your present state of health, do you have any difficulty performing the following activities:  Hearing? 0 1  Vision? 0 0  Difficulty concentrating or making decisions? 0 1  Walking or climbing stairs? 0 0  Dressing or bathing? 0 0  Doing errands, shopping? 0 0  Preparing Food and eating ? N   Using the Toilet? N   In the past six months, have you accidently leaked urine? N   Do you have problems with loss of bowel control? N   Managing your Medications? N   Managing your Finances? N   Housekeeping or managing your Housekeeping? N     Patient Care Team: Sowles, Krichna, MD as PCP - General (Family Medicine) Dessa, Frederick ORN, MD as Consulting Physician (General Surgery) Lateef, Munsoor, MD (Nephrology) Rennie Cindy SAUNDERS, MD as Consulting Physician (Internal Medicine) Elma Zachary RAMAN [facility])  Indicate any recent Medical Services you may have received from other than [facility] providers in the past year (date may be approximate).     Assessment:   This is a routine wellness examination for Antonino.  Hearing/Vision screen Hearing Screening - Comments:: NO AIDS Vision Screening  - Comments:: WEARS GLASSES ALL THE TIME- [facility]   Goals Addressed             This Visit's Progress    DIET - EAT MORE FRUITS AND VEGETABLES         Depression Screen    09/06/2023    8:55 AM 06/27/2023   10:12 AM 04/17/2023   11:09 AM 01/15/2023   11:04 AM 10/16/2022   10:11 AM 09/06/2022    8:09 AM 08/01/2022    9:10 AM  PHQ 2/9 Scores  PHQ - 2 Score 2 0 0 1 0 0 0  PHQ- 9 Score 2 3 0 4 2 2 3     Fall Risk    09/06/2023    8:59 AM 06/27/2023   10:12 AM 04/17/2023   11:09 AM 01/15/2023   11:03 AM 10/16/2022   10:09 AM  Fall Risk   Falls in the past year? 1 1 1 1  0  Number falls in past yr: 0 0 0 0   Injury with Fall? 0 0 0 0   Risk for fall due to : History of fall(s) History of fall(s) History of fall(s) History of fall(s) No Fall Risks  Follow up Falls prevention discussed;Falls evaluation completed Falls evaluation completed;Education provided;Falls prevention discussed Falls evaluation completed;Education provided;Falls prevention discussed Falls prevention discussed;Education provided;Falls evaluation completed Falls prevention discussed;Education provided;Falls evaluation completed    MEDICARE RISK AT HOME: Medicare Risk at Home Any stairs in or around the home?: Yes If so, are there any without handrails?: No Home free of loose throw rugs in walkways, pet beds, electrical cords, etc?: Yes Adequate lighting in your home to reduce risk of falls?: Yes Life alert?: No Use of a cane, walker or w/c?: No Grab bars in the bathroom?: Yes Shower chair or bench in shower?: No Elevated toilet seat or a handicapped toilet?: Yes  TIMED UP AND GO:  Was the test performed?  No    Cognitive Function:        09/06/2023    9:00 AM 08/01/2022    9:08 AM 07/27/2020  9:54 AM 07/18/2019    9:42 AM 05/29/2017   11:16 AM  6CIT Screen  What Year? 0 points 0 points 0 points 0 points 0 points  What month? 0 points 0 points 0 points 0 points 0 points  What time? 0 points 0 points  0 points 0 points 0 points  Count back from 20 0 points 0 points 0 points 0 points 0 points  Months in reverse 0 points 0 points 0 points 0 points 0 points  Repeat phrase 0 points 0 points 2 points 0 points 2 points  Total Score 0 points 0 points 2 points 0 points 2 points    Immunizations Immunization History  Administered Date(s) Administered   Fluad Quad(high Dose 65+) 07/27/2020, 04/29/2021, 06/15/2022   Fluad Trivalent(High Dose 65+) 06/27/2023   Influenza, High Dose Seasonal PF 05/29/2017, 06/27/2018, 06/25/2019   Influenza,inj,Quad PF,6+ Mos 06/18/2015, 05/11/2016   Influenza-Unspecified 06/25/2019   Moderna Sars-Covid-2 Vaccination 10/06/2019, 11/05/2019   Pneumococcal Conjugate-13 05/29/2017   Pneumococcal Polysaccharide-23 07/02/2018   Tdap 01/31/2013    TDAP status: Due, Education has been provided regarding the importance of this vaccine. Advised may receive this vaccine at local pharmacy or Health Dept. Aware to provide a copy of the vaccination record if obtained from local pharmacy or Health Dept. Verbalized acceptance and understanding.  Flu Vaccine status: Up to date  Pneumococcal vaccine status: Up to date  Covid-19 vaccine status: Completed vaccines  Qualifies for Shingles Vaccine? Yes   Zostavax completed No   Shingrix  Completed?: No.    Education has been provided regarding the importance of this vaccine. Patient has been advised to call insurance company to determine out of pocket expense if they have not yet received this vaccine. Advised may also receive vaccine at local pharmacy or Health Dept. Verbalized acceptance and understanding.  Screening Tests Health Maintenance  Topic Date Due   Zoster Vaccines- Shingrix  (1 of 2) Never done   COVID-19 Vaccine (3 - 2024-25 season) 04/29/2023   Medicare Annual Wellness (AWV)  09/05/2024   Colonoscopy  09/01/2025   Pneumonia Vaccine 70+ Years old  Completed   INFLUENZA VACCINE  Completed   Hepatitis C Screening   Completed   HPV VACCINES  Aged Out   DTaP/Tdap/Td  Discontinued    Health Maintenance  Health Maintenance Due  Topic Date Due   Zoster Vaccines- Shingrix  (1 of 2) Never done   COVID-19 Vaccine (3 - 2024-25 season) 04/29/2023    Colorectal cancer screening: Type of screening: Colonoscopy. Completed 09/01/20. Repeat every 5 years  Lung Cancer Screening: (Low Dose CT Chest recommended if Age 42-80 years, 20 pack-year currently smoking OR have quit w/in 15years.) does not qualify.    Additional Screening:  Hepatitis C Screening: does qualify; Completed 05/29/17  Vision Screening: Recommended annual ophthalmology exams for early detection of glaucoma and other disorders of the eye. Is the patient up to date with their annual eye exam?  Yes  Who is the provider or what is the name of the office in which the patient attends annual eye exams? DR.THURMOND If pt is not established with a provider, would they like to be referred to a provider to establish care? No .   Dental Screening: Recommended annual dental exams for proper oral hygiene   Community Resource Referral / Chronic Care Management: CRR required this visit?  No   CCM required this visit?  No     Plan:     I have personally reviewed and noted  the following in the patient's chart:   Medical and social history Use of alcohol, tobacco or illicit drugs  Current medications and supplements including opioid prescriptions. Patient is not currently taking opioid prescriptions. Functional ability and status Nutritional status Physical activity Advanced directives List of other physicians Hospitalizations, surgeries, and ER visits in previous 12 months Vitals Screenings to include cognitive, depression, and falls Referrals and appointments  In addition, I have reviewed and discussed with patient certain preventive protocols, quality metrics, and best practice recommendations. A written personalized care plan for  preventive services as well as general preventive health recommendations were provided to patient.     Jhonnie GORMAN Das, LPN   8/0/7974   After Visit Summary: (MyChart) Due to this being a telephonic visit, the after visit summary with patients personalized plan was offered to patient via MyChart   Nurse Notes: NONE

## 2023-09-12 NOTE — Telephone Encounter (Signed)
 Please call pt to get him on the nurse schedule for BP check for further adjustments on medications. Thanks

## 2023-09-13 NOTE — Telephone Encounter (Signed)
Lvm for pt to call and schedule a nurse visit for BP

## 2023-09-14 ENCOUNTER — Encounter: Payer: Self-pay | Admitting: Family Medicine

## 2023-09-14 ENCOUNTER — Ambulatory Visit (INDEPENDENT_AMBULATORY_CARE_PROVIDER_SITE_OTHER): Payer: Medicare HMO | Admitting: Family Medicine

## 2023-09-14 VITALS — BP 132/78 | HR 69 | Temp 97.9°F | Resp 18 | Ht 71.75 in | Wt 202.6 lb

## 2023-09-14 DIAGNOSIS — N183 Chronic kidney disease, stage 3 unspecified: Secondary | ICD-10-CM | POA: Diagnosis not present

## 2023-09-14 DIAGNOSIS — F338 Other recurrent depressive disorders: Secondary | ICD-10-CM | POA: Diagnosis not present

## 2023-09-14 DIAGNOSIS — I129 Hypertensive chronic kidney disease with stage 1 through stage 4 chronic kidney disease, or unspecified chronic kidney disease: Secondary | ICD-10-CM | POA: Diagnosis not present

## 2023-09-14 MED ORDER — VALSARTAN 320 MG PO TABS: 320.0000 mg | ORAL_TABLET | Freq: Every day | ORAL | 0 refills | Status: DC

## 2023-09-14 NOTE — Progress Notes (Signed)
Name: Frederick Nelson   MRN: [id number]    DOB: [birth year]   Date:09/14/2023       Progress Note  Subjective  Chief Complaint  Chief Complaint  Patient presents with   Medical Management of Chronic Issues   Medication Refill   Hypertension   Discussed the use of AI scribe software for clinical note transcription with the patient, who gave verbal consent to proceed.  History of Present Illness   The patient, with a history of hypertension and chronic kidney disease stage 3, presented today for bp follow up. He reported an increase in blood pressure readings, which had been consistently elevated to around 150, despite adherence to his prescribed valsartan 160mg . To manage this, the patient independently increased his valsartan dosage to one and a half tablets, which resulted in improved blood pressure control.  The patient also reported a seasonal pattern of feeling down, particularly after Christmas and into the early months of the year. He described this as a lifelong pattern and suspected it to be a form of seasonal affective disorder. The patient denied any other significant changes in his health status.       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   Monoclonal gammopathy 07/13/2021   Chronic kidney disease 06/06/2021   Essential hypertension 06/06/2021   Sinus bradycardia 06/19/2018   Hyperlipidemia 11/02/2017   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 The tumor was 2.6 cm,low grade with zero of 14 nodes positive. No evidence of venous or lymphatic invasion   COLONOSCOPY  [id number]   COLONOSCOPY WITH PROPOFOL N/A 08/04/2015   Procedure: COLONOSCOPY WITH PROPOFOL;  Surgeon: Earline Mayotte, MD;  Location: [facility] ENDOSCOPY;  Service: Endoscopy;  Laterality: N/A;   COLONOSCOPY WITH PROPOFOL N/A 09/01/2020   Procedure: COLONOSCOPY WITH PROPOFOL;  Surgeon: Earline Mayotte, MD;  Location: [facility] ENDOSCOPY;  Service: Endoscopy;  Laterality: N/A;    Family History  Problem Relation Age of Onset   Cancer Brother        cancer of the thymus   Diabetes Mother    Hypertension Father    Stroke Father    Cancer Maternal Aunt        breast cancer    Social History   Tobacco Use   Smoking status: Never    Passive exposure: Never   Smokeless tobacco: Never  Substance Use Topics   Alcohol use: Yes    Alcohol/week: 1.0 - 2.0 standard drink of alcohol    Types: 1 - 2 Standard drinks or equivalent per week    Comment: Socially only - Very little     Current  Outpatient Medications:    Ascorbic Acid (VITA-C PO), Take 1 tablet by mouth daily., Disp: , Rfl:    atorvastatin (LIPITOR) 20 MG tablet, TAKE 1 TABLET BY MOUTH EVERY DAY, Disp: 90 tablet, Rfl: 1   clopidogrel (PLAVIX) 75 MG tablet, TAKE 1 TABLET BY MOUTH EVERY DAY, Disp: 90 tablet, Rfl: 1   Lemborexant (DAYVIGO) 10 MG TABS, Take 1 tablet (10 mg total) by mouth at bedtime., Disp: 90 tablet, Rfl: 1   loratadine (CLARITIN) 10 MG tablet, Take 1 tablet (10 mg total) by mouth daily., Disp: 90 tablet, Rfl:  1   sildenafil (VIAGRA) 100 MG tablet, Take 0.5-1 tablets (50-100 mg total) by mouth daily as needed for erectile dysfunction., Disp: 30 tablet, Rfl: 0   tamsulosin (FLOMAX) 0.4 MG CAPS capsule, Take 1 capsule (0.4 mg total) by mouth daily., Disp: 90 capsule, Rfl: 1   valACYclovir (VALTREX) 1000 MG tablet, Take 1 tablet (1,000 mg total) by mouth daily., Disp: 90 tablet, Rfl: 1   valsartan (DIOVAN) 320 MG tablet, Take 1 tablet (320 mg total) by mouth daily., Disp: 90 tablet, Rfl: 0  Allergies  Allergen Reactions   Ace Inhibitors Swelling   Ciprofloxacin     Tendon damage   Jardiance [Empagliflozin] Rash    I personally reviewed active problem list, medication list, allergies with the patient/caregiver today.   ROS  Ten systems reviewed and is negative except as mentioned in HPI    Objective  Vitals:   09/14/23 1545  BP: 132/78  Pulse: 69  Resp: 18  Temp: 97.9 F (36.6 C)  SpO2: 100%  Weight: 202 lb 9.6 oz (91.9 kg)  Height: 5' 11.75" (1.822 m)    Body mass index is 27.67 kg/m.  Physical Exam  Constitutional: Patient appears well-developed and well-nourished.  No distress.  HEENT: head atraumatic, normocephalic, pupils equal and reactive to light,, neck supple, throat within normal limits Cardiovascular: Normal rate, regular rhythm and normal heart sounds.  No murmur heard. No BLE edema. Pulmonary/Chest: Effort normal and breath sounds normal. No respiratory distress. Abdominal: Soft.  There is no tenderness. Psychiatric: Patient has a normal mood and affect. behavior is normal. Judgment and thought content normal.   Diabetic Foot Exam:     PHQ2/9:    09/06/2023    8:55 AM 06/27/2023   10:12 AM 04/17/2023   11:09 AM 01/15/2023   11:04 AM 10/16/2022   10:11 AM  Depression screen PHQ 2/9  Decreased Interest 1 0 0 1 0  Down, Depressed, Hopeless 1 0 0 0 0  PHQ - 2 Score 2 0 0 1 0  Altered sleeping 0 2 0 2 1  Tired, decreased energy 0 1 0 1 1  Change in appetite 0  0 0 0 0  Feeling bad or failure about yourself  0 0 0 0 0  Trouble concentrating 0 0 0 0 0  Moving slowly or fidgety/restless 0 0 0 0 0  Suicidal thoughts 0 0 0 0 0  PHQ-9 Score 2 3 0 4 2  Difficult doing work/chores Not difficult at all Somewhat difficult  Not difficult at all Not difficult at all    phq 9 is positive   Fall Risk:    09/06/2023    8:59 AM 06/27/2023   10:12 AM 04/17/2023   11:09 AM 01/15/2023   11:03 AM 10/16/2022   10:09 AM  Fall Risk   Falls in the past year? 1 1 1 1  0  Number falls in past  yr: 0 0 0 0   Injury with Fall? 0 0 0 0   Risk for fall due to : History of fall(s) History of fall(s) History of fall(s) History of fall(s) No Fall Risks  Follow up Falls prevention discussed;Falls evaluation completed Falls evaluation completed;Education provided;Falls prevention discussed Falls evaluation completed;Education provided;Falls prevention discussed Falls prevention discussed;Education provided;Falls evaluation completed Falls prevention discussed;Education provided;Falls evaluation completed     Assessment and Plan    Hypertension with CKI stage 3 Elevated blood pressure readings despite valsartan 160mg . Patient self-adjusted dose to 1.5 tablets with improved control. Discussed options including increasing valsartan dose or switching to olmesartan. Patient prefers to stay on valsartan. -Increase valsartan to 320mg  daily. -Check blood pressure in 1 month to assess response to increased dose.  Seasonal Affective Disorder Patient reports feeling down after Christmas, a pattern observed throughout his life. No interest in pharmacological intervention. -Recommended use of UV light and increased physical activity. -Follow-up in 1 month to assess mood.  Chronic Kidney Disease (Stage 3) No new issues. -Continue current management. -Plan to check kidney function at next visit in 1 month.

## 2023-10-22 ENCOUNTER — Other Ambulatory Visit: Payer: Self-pay | Admitting: Family Medicine

## 2023-10-22 ENCOUNTER — Encounter: Payer: Self-pay | Admitting: Family Medicine

## 2023-10-22 ENCOUNTER — Ambulatory Visit
Admission: RE | Admit: 2023-10-22 | Discharge: 2023-10-22 | Disposition: A | Discharge status: Home / Self Care | Payer: Medicare HMO | Source: Ambulatory Visit | Attending: Family Medicine | Admitting: Family Medicine

## 2023-10-22 ENCOUNTER — Ambulatory Visit (INDEPENDENT_AMBULATORY_CARE_PROVIDER_SITE_OTHER): Payer: Medicare HMO | Admitting: Family Medicine

## 2023-10-22 VITALS — BP 132/74 | HR 58 | Temp 97.7°F | Resp 16 | Ht 71.75 in | Wt 200.0 lb

## 2023-10-22 DIAGNOSIS — I129 Hypertensive chronic kidney disease with stage 1 through stage 4 chronic kidney disease, or unspecified chronic kidney disease: Secondary | ICD-10-CM

## 2023-10-22 DIAGNOSIS — Z8673 Personal history of transient ischemic attack (TIA), and cerebral infarction without residual deficits: Secondary | ICD-10-CM | POA: Diagnosis not present

## 2023-10-22 DIAGNOSIS — R7303 Prediabetes: Secondary | ICD-10-CM

## 2023-10-22 DIAGNOSIS — G4733 Obstructive sleep apnea (adult) (pediatric): Secondary | ICD-10-CM | POA: Diagnosis not present

## 2023-10-22 DIAGNOSIS — N1831 Chronic kidney disease, stage 3a: Secondary | ICD-10-CM

## 2023-10-22 DIAGNOSIS — R202 Paresthesia of skin: Secondary | ICD-10-CM | POA: Insufficient documentation

## 2023-10-22 DIAGNOSIS — E559 Vitamin D deficiency, unspecified: Secondary | ICD-10-CM | POA: Diagnosis not present

## 2023-10-22 DIAGNOSIS — M109 Gout, unspecified: Secondary | ICD-10-CM

## 2023-10-22 DIAGNOSIS — F338 Other recurrent depressive disorders: Secondary | ICD-10-CM

## 2023-10-22 DIAGNOSIS — N401 Enlarged prostate with lower urinary tract symptoms: Secondary | ICD-10-CM | POA: Diagnosis not present

## 2023-10-22 DIAGNOSIS — N183 Hypertensive chronic kidney disease with stage 1 through stage 4 chronic kidney disease, or unspecified chronic kidney disease: Secondary | ICD-10-CM

## 2023-10-22 DIAGNOSIS — G459 Transient cerebral ischemic attack, unspecified: Secondary | ICD-10-CM | POA: Diagnosis not present

## 2023-10-22 DIAGNOSIS — R739 Hyperglycemia, unspecified: Secondary | ICD-10-CM

## 2023-10-22 DIAGNOSIS — F5104 Psychophysiologic insomnia: Secondary | ICD-10-CM

## 2023-10-22 NOTE — Patient Instructions (Signed)
 Referral has been sent to [facility]   P: [phone] F: [phone]   Release ID # [id number]

## 2023-10-22 NOTE — Progress Notes (Signed)
 Name: Frederick Nelson   MRN: [id number]    DOB: 15-Feb-1951   Date:10/26/2023       Progress Note  Subjective  Chief Complaint  Chief Complaint  Patient presents with   Medical Management of Chronic Issues    HTN- pt states at home systolic runs 130s-140s and diastolic average 82N   HPI   HTN with CKI stage IIIa: He is currently taking Valsartan 320 mg daily since BP was trending up and he had adjusted his medication of 160 mg to one and a half daily. He denies chest pain, palpitation or SOB. We will recheck labs today    Insomnia: he stopped taking Belsomra 10 mg due to causing him sleepy during the day. He has been taking some otc anti-histamine. He states works well for him. He has tried and failed Trazodone, Temazepam, Seroquel and  Belsomra. He is now taking Dayvigo 10mg  and seems to work a little for him    OSA:  he states has not decided on starting CPAP machine yet, states he is very sensitive to having anything on his face. Positive home study and advised to have an auto adjusting CPAP at 4-20 cm H2O - he does not want to go forward with that    Balance problems/ visual changes and also paresthesia: he was having  intermittent balance problems, also paresthesia - on and off - feels like burning or itching of skin anywhere on his body it lasts a few minutes per episode on and off intermittently. He also has intermittent visual disturbance , he states it got worse prior to the TIA back in May 23, ut have improved since. He had a syncopal episode in 2024. He had another syncopal episode last weekend, he blamed that on taking Valsartan 160 mg one night and the new dose of Valsartan 320 mg, he  was standing for a while and he felt dizzy, sweaty, sat down and vomited once , he had a brief loss of consciousness since he does not recall vomiting. He denies chest pain. It was witnessed but nobody called 911. He states after he vomited he felt fine. He states last night he also took a dose of  Nyquil due to sinus congestion, he woke up at 4:30 am to void , he got back in bed and he reached for his phone and his right hand was numb, he resolved with movement of his hand. He is taking Atorvastatin and plavix . He had a visit to see neurologist - Dr. Sherryll Burger last Fall but missed appointment , he is willing to go back to see him    Seasonal Affective Disorder: feeling better now    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   Monoclonal gammopathy 07/13/2021   Chronic kidney disease 06/06/2021   Essential hypertension 06/06/2021   Sinus bradycardia 06/19/2018   Hyperlipidemia 11/02/2017   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 The tumor was 2.6 cm,low grade with zero of 14 nodes positive. No evidence of venous or lymphatic invasion   COLONOSCOPY  [id number]   COLONOSCOPY WITH PROPOFOL N/A 08/04/2015   Procedure: COLONOSCOPY WITH PROPOFOL;  Surgeon: Earline Mayotte, MD;  Location: [facility] ENDOSCOPY;  Service: Endoscopy;  Laterality: N/A;   COLONOSCOPY WITH PROPOFOL N/A 09/01/2020    Procedure: COLONOSCOPY WITH PROPOFOL;  Surgeon: Earline Mayotte, MD;  Location: [facility] ENDOSCOPY;  Service: Endoscopy;  Laterality: N/A;    Family History  Problem Relation Age of Onset   Cancer Brother        cancer of the thymus   Diabetes Mother    Hypertension Father    Stroke Father    Cancer Maternal Aunt        breast cancer    Social History   Tobacco Use   Smoking status: Never    Passive exposure: Never   Smokeless tobacco: Never  Substance Use Topics   Alcohol use: Yes    Alcohol/week: 1.0 - 2.0 standard drink of alcohol    Types: 1 - 2 Standard drinks or equivalent per week    Comment: Socially only - Very little     Current Outpatient Medications:    Ascorbic Acid (VITA-C PO), Take 1 tablet by mouth daily., Disp: , Rfl:    atorvastatin (LIPITOR) 20 MG tablet, TAKE 1 TABLET BY MOUTH EVERY DAY, Disp: 90 tablet, Rfl: 1   clopidogrel (PLAVIX) 75 MG tablet, TAKE 1 TABLET BY MOUTH EVERY DAY, Disp: 90 tablet, Rfl: 1   Lemborexant (DAYVIGO) 10 MG TABS, Take 1 tablet (10 mg total) by mouth at bedtime., Disp: 90 tablet, Rfl: 1   loratadine (CLARITIN) 10 MG tablet, Take 1 tablet (10 mg total) by mouth daily., Disp: 90 tablet, Rfl: 1   sildenafil (VIAGRA) 100 MG tablet, Take 0.5-1 tablets (50-100 mg total) by mouth daily as needed for erectile dysfunction., Disp: 30 tablet, Rfl: 0   tamsulosin (FLOMAX) 0.4 MG CAPS capsule, Take 1 capsule (0.4 mg total) by mouth daily., Disp: 90 capsule, Rfl: 1   valACYclovir (VALTREX) 1000 MG tablet, Take 1 tablet (1,000 mg total) by mouth daily., Disp: 90 tablet, Rfl: 1   valsartan (DIOVAN) 320 MG tablet, Take 1 tablet (320 mg total) by mouth daily., Disp: 90 tablet, Rfl: 0  Allergies  Allergen Reactions   Ace Inhibitors Swelling   Ciprofloxacin     Tendon damage   Jardiance [Empagliflozin] Rash    I personally reviewed active problem list, medication list, allergies, family history with the patient/caregiver  today.   ROS  Ten systems reviewed and is negative except as mentioned in HPI    Objective  Vitals:   10/22/23 0932  BP: 132/74  Pulse: (!) 58  Resp: 16  Temp: 97.7 F (36.5 C)  TempSrc: Oral  SpO2: 100%  Weight: 200 lb (90.7 kg)  Height: 5' 11.75" (1.822 m)    Body mass index is 27.31 kg/m.  Physical Exam  Constitutional: Patient appears well-developed and well-nourished. Obese  No distress.  HEENT: head atraumatic, normocephalic, pupils equal and reactive to light, ears normal TM , neck supple, throat within normal limits Cardiovascular: Normal rate, regular rhythm  and normal heart sounds.  No murmur heard. No BLE edema. Pulmonary/Chest: Effort normal and breath sounds normal. No respiratory distress. Abdominal: Soft.  There is no tenderness. Neuro: romberg negative  Psychiatric: Patient has a normal mood and affect. behavior is normal. Judgment and thought content normal.   Diabetic Foot Exam:     PHQ2/9:    10/22/2023    9:32 AM 09/06/2023    8:55 AM 06/27/2023   10:12 AM 04/17/2023   11:09 AM 01/15/2023   11:04 AM  Depression screen PHQ 2/9  Decreased Interest 0 1 0 0 1  Down, Depressed, Hopeless 0 1 0 0 0  PHQ - 2 Score 0 2 0 0 1  Altered sleeping 0 0 2 0 2  Tired, decreased energy 0 0 1 0 1  Change in appetite 0 0 0 0 0  Feeling bad or failure about yourself  0 0 0 0 0  Trouble concentrating 0 0 0 0 0  Moving slowly or fidgety/restless 0 0 0 0 0  Suicidal thoughts 0 0 0 0 0  PHQ-9 Score 0 2 3 0 4  Difficult doing work/chores Not difficult at all Not difficult at all Somewhat difficult  Not difficult at all    phq 9 is negative  Fall Risk:    09/06/2023    8:59 AM 06/27/2023   10:12 AM 04/17/2023   11:09 AM 01/15/2023   11:03 AM 10/16/2022   10:09 AM  Fall Risk   Falls in the past year? 1 1 1 1  0  Number falls in past yr: 0 0 0 0   Injury with Fall? 0 0 0 0   Risk for fall due to : History of fall(s) History of fall(s) History of fall(s) History  of fall(s) No Fall Risks  Follow up Falls prevention discussed;Falls evaluation completed Falls evaluation completed;Education provided;Falls prevention discussed Falls evaluation completed;Education provided;Falls prevention discussed Falls prevention discussed;Education provided;Falls evaluation completed Falls prevention discussed;Education provided;Falls evaluation completed     Assessment & Plan  1. Benign hypertension with chronic kidney disease, stage III (HCC) (Primary)  - Lipid panel - COMPLETE METABOLIC PANEL WITH GFR - TSH  2. Seasonal affective disorder (HCC)  Doing better  3. Stage 3a chronic kidney disease (HCC)  - COMPLETE METABOLIC PANEL WITH GFR - CBC with Differential/Platelet  4. BPH with obstruction/lower urinary tract symptoms  Still gets up multiple times a night  5. Vitamin D deficiency  - VITAMIN D 25 Hydroxy (Vit-D Deficiency, Fractures)  6. Chronic insomnia  Continue medication  7. Prediabetes  Recheck labs   8. OSA (obstructive sleep apnea)  Refused CPAP  9. Controlled gout  - Uric acid  10. History of TIA (transient ischemic attack)  - CT HEAD WO CONTRAST ( ); Future - Lipid panel  11. Paresthesias in right hand  - CT HEAD WO CONTRAST ( ); Future - B12 and Folate Panel  12. Hyperglycemia  - Hemoglobin A1c

## 2023-10-23 ENCOUNTER — Encounter: Payer: Self-pay | Admitting: Family Medicine

## 2023-10-23 LAB — CBC WITH DIFFERENTIAL/PLATELET
Absolute Lymphocytes: 2306 {cells}/uL (ref 850–3900)
Absolute Monocytes: 686 {cells}/uL (ref 200–950)
Basophils Absolute: 40 {cells}/uL (ref 0–200)
Basophils Relative: 0.9 %
Eosinophils Absolute: 361 {cells}/uL (ref 15–500)
Eosinophils Relative: 8.2 %
HCT: 44.5 % (ref 38.5–50.0)
Hemoglobin: 14.7 g/dL (ref 13.2–17.1)
MCH: 30.6 pg (ref 27.0–33.0)
MCHC: 33 g/dL (ref 32.0–36.0)
MCV: 92.7 fL (ref 80.0–100.0)
MPV: 11.8 fL (ref 7.5–12.5)
Monocytes Relative: 15.6 %
Neutro Abs: 1008 {cells}/uL — ABNORMAL LOW (ref 1500–7800)
Neutrophils Relative %: 22.9 %
Platelets: 160 10*3/uL (ref 140–400)
RBC: 4.8 10*6/uL (ref 4.20–5.80)
RDW: 12.5 % (ref 11.0–15.0)
Total Lymphocyte: 52.4 %
WBC: 4.4 10*3/uL (ref 3.8–10.8)

## 2023-10-23 LAB — HEMOGLOBIN A1C
Hgb A1c MFr Bld: 6.1 %{Hb} — ABNORMAL HIGH (ref <5.7)
Mean Plasma Glucose: 128 mg/dL
eAG (mmol/L): 7.1 mmol/L

## 2023-10-23 LAB — COMPLETE METABOLIC PANEL WITH GFR
AG Ratio: 1.4 (calc) (ref 1.0–2.5)
ALT: 21 U/L (ref 9–46)
AST: 20 U/L (ref 10–35)
Albumin: 4 g/dL (ref 3.6–5.1)
Alkaline phosphatase (APISO): 66 U/L (ref 35–144)
BUN/Creatinine Ratio: 12 (calc) (ref 6–22)
BUN: 18 mg/dL (ref 7–25)
CO2: 28 mmol/L (ref 20–32)
Calcium: 9.2 mg/dL (ref 8.6–10.3)
Chloride: 107 mmol/L (ref 98–110)
Creat: 1.46 mg/dL — ABNORMAL HIGH (ref 0.70–1.28)
Globulin: 2.9 g/dL (ref 1.9–3.7)
Glucose, Bld: 104 mg/dL — ABNORMAL HIGH (ref 65–99)
Potassium: 5.1 mmol/L (ref 3.5–5.3)
Sodium: 145 mmol/L (ref 135–146)
Total Bilirubin: 0.4 mg/dL (ref 0.2–1.2)
Total Protein: 6.9 g/dL (ref 6.1–8.1)
eGFR: 51 mL/min/{1.73_m2} — ABNORMAL LOW (ref >=60)

## 2023-10-23 LAB — LIPID PANEL
Cholesterol: 116 mg/dL (ref <200)
HDL: 45 mg/dL (ref >=40)
LDL Cholesterol (Calc): 57 mg/dL
Non-HDL Cholesterol (Calc): 71 mg/dL (ref <130)
Total CHOL/HDL Ratio: 2.6 (calc) (ref <5.0)
Triglycerides: 68 mg/dL (ref <150)

## 2023-10-23 LAB — VITAMIN D 25 HYDROXY (VIT D DEFICIENCY, FRACTURES): Vit D, 25-Hydroxy: 51 ng/mL (ref 30–100)

## 2023-10-23 LAB — B12 AND FOLATE PANEL
Folate: 11 ng/mL
Vitamin B-12: 414 pg/mL (ref 200–1100)

## 2023-10-23 LAB — TSH: TSH: 2.68 m[IU]/L (ref 0.40–4.50)

## 2023-10-23 LAB — URIC ACID: Uric Acid, Serum: 5.6 mg/dL (ref 4.0–8.0)

## 2023-10-24 ENCOUNTER — Encounter: Payer: Self-pay | Admitting: Family Medicine

## 2023-10-30 DIAGNOSIS — G3184 Mild cognitive impairment, so stated: Secondary | ICD-10-CM | POA: Diagnosis not present

## 2023-11-13 ENCOUNTER — Other Ambulatory Visit: Payer: Self-pay | Admitting: Family Medicine

## 2023-11-13 DIAGNOSIS — F5104 Psychophysiologic insomnia: Secondary | ICD-10-CM

## 2023-11-13 MED ORDER — DAYVIGO 10 MG PO TABS: 10.0000 mg | ORAL_TABLET | Freq: Every day | ORAL | 1 refills | Status: DC

## 2023-11-13 NOTE — Telephone Encounter (Signed)
 Pt had a follow up appt 09/2023

## 2023-11-13 NOTE — Addendum Note (Signed)
 Addended by: Forde Radon on: 11/13/2023 02:51 PM   Modules accepted: Orders

## 2023-11-15 ENCOUNTER — Ambulatory Visit: Payer: Medicare HMO | Admitting: Family Medicine

## 2023-12-03 ENCOUNTER — Other Ambulatory Visit: Payer: Self-pay | Admitting: Family Medicine

## 2023-12-03 DIAGNOSIS — E559 Vitamin D deficiency, unspecified: Secondary | ICD-10-CM

## 2023-12-09 ENCOUNTER — Other Ambulatory Visit: Payer: Self-pay | Admitting: Family Medicine

## 2023-12-09 DIAGNOSIS — I129 Hypertensive chronic kidney disease with stage 1 through stage 4 chronic kidney disease, or unspecified chronic kidney disease: Secondary | ICD-10-CM

## 2023-12-10 ENCOUNTER — Other Ambulatory Visit: Payer: Self-pay | Admitting: Family Medicine

## 2023-12-10 DIAGNOSIS — E559 Vitamin D deficiency, unspecified: Secondary | ICD-10-CM

## 2023-12-24 ENCOUNTER — Other Ambulatory Visit: Payer: Self-pay | Admitting: Family Medicine

## 2023-12-24 DIAGNOSIS — E559 Vitamin D deficiency, unspecified: Secondary | ICD-10-CM

## 2023-12-24 DIAGNOSIS — J302 Other seasonal allergic rhinitis: Secondary | ICD-10-CM

## 2023-12-24 DIAGNOSIS — N183 Chronic kidney disease, stage 3 unspecified: Secondary | ICD-10-CM

## 2023-12-24 DIAGNOSIS — N138 Other obstructive and reflux uropathy: Secondary | ICD-10-CM

## 2023-12-25 ENCOUNTER — Other Ambulatory Visit: Payer: Self-pay

## 2023-12-25 DIAGNOSIS — J302 Other seasonal allergic rhinitis: Secondary | ICD-10-CM

## 2023-12-25 DIAGNOSIS — N401 Enlarged prostate with lower urinary tract symptoms: Secondary | ICD-10-CM

## 2023-12-25 DIAGNOSIS — N138 Other obstructive and reflux uropathy: Secondary | ICD-10-CM

## 2023-12-25 MED ORDER — LORATADINE 10 MG PO TABS: 10.0000 mg | ORAL_TABLET | Freq: Every day | ORAL | 0 refills | Status: AC

## 2023-12-25 MED ORDER — TAMSULOSIN HCL 0.4 MG PO CAPS: 0.4000 mg | ORAL_CAPSULE | Freq: Every day | ORAL | 0 refills | Status: DC

## 2024-01-02 ENCOUNTER — Other Ambulatory Visit: Payer: Self-pay | Admitting: Family Medicine

## 2024-01-02 DIAGNOSIS — E559 Vitamin D deficiency, unspecified: Secondary | ICD-10-CM

## 2024-01-04 ENCOUNTER — Other Ambulatory Visit: Payer: Self-pay | Admitting: Family Medicine

## 2024-01-04 DIAGNOSIS — E559 Vitamin D deficiency, unspecified: Secondary | ICD-10-CM

## 2024-02-19 ENCOUNTER — Other Ambulatory Visit: Payer: Self-pay | Admitting: Family Medicine

## 2024-02-19 DIAGNOSIS — E559 Vitamin D deficiency, unspecified: Secondary | ICD-10-CM

## 2024-02-19 DIAGNOSIS — I129 Hypertensive chronic kidney disease with stage 1 through stage 4 chronic kidney disease, or unspecified chronic kidney disease: Secondary | ICD-10-CM

## 2024-02-19 DIAGNOSIS — J302 Other seasonal allergic rhinitis: Secondary | ICD-10-CM

## 2024-03-03 DIAGNOSIS — G459 Transient cerebral ischemic attack, unspecified: Secondary | ICD-10-CM | POA: Diagnosis not present

## 2024-03-03 DIAGNOSIS — G4733 Obstructive sleep apnea (adult) (pediatric): Secondary | ICD-10-CM | POA: Diagnosis not present

## 2024-03-03 DIAGNOSIS — R55 Syncope and collapse: Secondary | ICD-10-CM | POA: Diagnosis not present

## 2024-03-03 DIAGNOSIS — I672 Cerebral atherosclerosis: Secondary | ICD-10-CM | POA: Diagnosis not present

## 2024-03-03 DIAGNOSIS — I10 Essential (primary) hypertension: Secondary | ICD-10-CM | POA: Diagnosis not present

## 2024-03-03 DIAGNOSIS — F5101 Primary insomnia: Secondary | ICD-10-CM | POA: Diagnosis not present

## 2024-03-25 ENCOUNTER — Other Ambulatory Visit: Payer: Self-pay | Admitting: Family Medicine

## 2024-03-25 DIAGNOSIS — E559 Vitamin D deficiency, unspecified: Secondary | ICD-10-CM

## 2024-03-25 DIAGNOSIS — N138 Other obstructive and reflux uropathy: Secondary | ICD-10-CM

## 2024-03-26 DIAGNOSIS — E7800 Pure hypercholesterolemia, unspecified: Secondary | ICD-10-CM | POA: Diagnosis not present

## 2024-03-26 DIAGNOSIS — I10 Essential (primary) hypertension: Secondary | ICD-10-CM | POA: Diagnosis not present

## 2024-03-26 DIAGNOSIS — R55 Syncope and collapse: Secondary | ICD-10-CM | POA: Diagnosis not present

## 2024-04-12 DIAGNOSIS — R55 Syncope and collapse: Secondary | ICD-10-CM | POA: Diagnosis not present

## 2024-04-21 ENCOUNTER — Other Ambulatory Visit: Payer: Self-pay | Admitting: Family Medicine

## 2024-04-21 DIAGNOSIS — I129 Hypertensive chronic kidney disease with stage 1 through stage 4 chronic kidney disease, or unspecified chronic kidney disease: Secondary | ICD-10-CM

## 2024-04-21 DIAGNOSIS — N1831 Chronic kidney disease, stage 3a: Secondary | ICD-10-CM

## 2024-04-29 DIAGNOSIS — R55 Syncope and collapse: Secondary | ICD-10-CM | POA: Diagnosis not present

## 2024-05-04 ENCOUNTER — Other Ambulatory Visit: Payer: Self-pay | Admitting: Family Medicine

## 2024-05-04 DIAGNOSIS — Z8673 Personal history of transient ischemic attack (TIA), and cerebral infarction without residual deficits: Secondary | ICD-10-CM

## 2024-05-05 ENCOUNTER — Other Ambulatory Visit: Payer: Self-pay | Admitting: Family Medicine

## 2024-05-05 DIAGNOSIS — Z8673 Personal history of transient ischemic attack (TIA), and cerebral infarction without residual deficits: Secondary | ICD-10-CM

## 2024-05-07 DIAGNOSIS — Z8673 Personal history of transient ischemic attack (TIA), and cerebral infarction without residual deficits: Secondary | ICD-10-CM | POA: Diagnosis not present

## 2024-05-07 DIAGNOSIS — I471 Supraventricular tachycardia, unspecified: Secondary | ICD-10-CM | POA: Diagnosis not present

## 2024-05-07 DIAGNOSIS — I10 Essential (primary) hypertension: Secondary | ICD-10-CM | POA: Diagnosis not present

## 2024-05-07 DIAGNOSIS — R55 Syncope and collapse: Secondary | ICD-10-CM | POA: Diagnosis not present

## 2024-05-07 DIAGNOSIS — E7800 Pure hypercholesterolemia, unspecified: Secondary | ICD-10-CM | POA: Diagnosis not present

## 2024-05-19 ENCOUNTER — Other Ambulatory Visit: Payer: Self-pay | Admitting: Family Medicine

## 2024-05-19 DIAGNOSIS — F5104 Psychophysiologic insomnia: Secondary | ICD-10-CM

## 2024-05-20 ENCOUNTER — Other Ambulatory Visit: Payer: Self-pay | Admitting: Family Medicine

## 2024-05-20 DIAGNOSIS — A6002 Herpesviral infection of other male genital organs: Secondary | ICD-10-CM

## 2024-05-25 DIAGNOSIS — G4733 Obstructive sleep apnea (adult) (pediatric): Secondary | ICD-10-CM | POA: Diagnosis not present

## 2024-06-18 DIAGNOSIS — R55 Syncope and collapse: Secondary | ICD-10-CM | POA: Diagnosis not present

## 2024-06-18 DIAGNOSIS — G459 Transient cerebral ischemic attack, unspecified: Secondary | ICD-10-CM | POA: Diagnosis not present

## 2024-06-18 DIAGNOSIS — G4733 Obstructive sleep apnea (adult) (pediatric): Secondary | ICD-10-CM | POA: Diagnosis not present

## 2024-06-18 DIAGNOSIS — Z1331 Encounter for screening for depression: Secondary | ICD-10-CM | POA: Diagnosis not present

## 2024-06-21 ENCOUNTER — Other Ambulatory Visit: Payer: Self-pay | Admitting: Family Medicine

## 2024-06-21 DIAGNOSIS — N138 Other obstructive and reflux uropathy: Secondary | ICD-10-CM

## 2024-07-01 ENCOUNTER — Other Ambulatory Visit: Payer: Self-pay

## 2024-07-01 ENCOUNTER — Encounter: Payer: Self-pay | Admitting: Family Medicine

## 2024-07-01 ENCOUNTER — Ambulatory Visit: Payer: Self-pay | Admitting: Family Medicine

## 2024-07-01 VITALS — BP 128/76 | HR 64 | Resp 16 | Ht 71.75 in | Wt 198.0 lb

## 2024-07-01 DIAGNOSIS — F5104 Psychophysiologic insomnia: Secondary | ICD-10-CM | POA: Diagnosis not present

## 2024-07-01 DIAGNOSIS — A6002 Herpesviral infection of other male genital organs: Secondary | ICD-10-CM | POA: Diagnosis not present

## 2024-07-01 DIAGNOSIS — N401 Enlarged prostate with lower urinary tract symptoms: Secondary | ICD-10-CM | POA: Diagnosis not present

## 2024-07-01 DIAGNOSIS — F338 Other recurrent depressive disorders: Secondary | ICD-10-CM | POA: Diagnosis not present

## 2024-07-01 DIAGNOSIS — G4733 Obstructive sleep apnea (adult) (pediatric): Secondary | ICD-10-CM | POA: Diagnosis not present

## 2024-07-01 DIAGNOSIS — N138 Other obstructive and reflux uropathy: Secondary | ICD-10-CM | POA: Diagnosis not present

## 2024-07-01 DIAGNOSIS — Z23 Encounter for immunization: Secondary | ICD-10-CM

## 2024-07-01 DIAGNOSIS — Z8673 Personal history of transient ischemic attack (TIA), and cerebral infarction without residual deficits: Secondary | ICD-10-CM | POA: Diagnosis not present

## 2024-07-01 DIAGNOSIS — Z0001 Encounter for general adult medical examination with abnormal findings: Secondary | ICD-10-CM

## 2024-07-01 DIAGNOSIS — N1831 Chronic kidney disease, stage 3a: Secondary | ICD-10-CM | POA: Diagnosis not present

## 2024-07-01 DIAGNOSIS — E559 Vitamin D deficiency, unspecified: Secondary | ICD-10-CM

## 2024-07-01 DIAGNOSIS — R739 Hyperglycemia, unspecified: Secondary | ICD-10-CM | POA: Diagnosis not present

## 2024-07-01 DIAGNOSIS — M109 Gout, unspecified: Secondary | ICD-10-CM | POA: Diagnosis not present

## 2024-07-01 DIAGNOSIS — I129 Hypertensive chronic kidney disease with stage 1 through stage 4 chronic kidney disease, or unspecified chronic kidney disease: Secondary | ICD-10-CM

## 2024-07-01 DIAGNOSIS — N183 Chronic kidney disease, stage 3 unspecified: Secondary | ICD-10-CM | POA: Diagnosis not present

## 2024-07-01 MED ORDER — TAMSULOSIN HCL 0.4 MG PO CAPS: 0.4000 mg | ORAL_CAPSULE | Freq: Every day | ORAL | 1 refills | Status: AC

## 2024-07-01 MED ORDER — ATORVASTATIN CALCIUM 20 MG PO TABS: 20.0000 mg | ORAL_TABLET | Freq: Every day | ORAL | 1 refills | Status: AC

## 2024-07-01 MED ORDER — CLOPIDOGREL BISULFATE 75 MG PO TABS: 75.0000 mg | ORAL_TABLET | Freq: Every day | ORAL | 1 refills | Status: AC

## 2024-07-01 MED ORDER — VALACYCLOVIR HCL 1 G PO TABS: 1000.0000 mg | ORAL_TABLET | Freq: Every day | ORAL | 0 refills | Status: DC

## 2024-07-01 MED ORDER — VALACYCLOVIR HCL 1 G PO TABS: 1000.0000 mg | ORAL_TABLET | Freq: Every day | ORAL | 1 refills | Status: AC

## 2024-07-01 MED ORDER — FINASTERIDE 5 MG PO TABS: 5.0000 mg | ORAL_TABLET | Freq: Every day | ORAL | 3 refills | Status: AC

## 2024-07-01 MED ORDER — VALSARTAN 320 MG PO TABS
320.0000 mg | ORAL_TABLET | Freq: Every day | ORAL | 1 refills | Status: AC
Start: 2024-07-01 — End: ?

## 2024-07-01 MED ORDER — DAYVIGO 10 MG PO TABS: 1.0000 | ORAL_TABLET | Freq: Every evening | ORAL | 1 refills | Status: AC

## 2024-07-01 NOTE — Progress Notes (Signed)
 Name: Frederick Nelson   MRN: [id number]    DOB: 1950/09/15   Date:07/01/2024       Progress Note  Subjective  Chief Complaint  Chief Complaint  Patient presents with   Annual Exam    HPI  Patient presents for annual CPE and follow up  Discussed the use of AI scribe software for clinical note transcription with the patient, who gave verbal consent to proceed.  History of Present Illness Frederick Nelson is a 73 year old male with hypertension and chronic kidney disease stage 3A who presents for a follow-up visit.  He has hypertension and chronic kidney disease stage 3A. His estimated glomerular filtration rate (eGFR) has ranged between 43 and 52 over the past four years. No symptoms of kidney insufficiency such as itching or confusion, but he notes frequent and urgent urination, which he attributes to prostate issues.  He has benign prostatic hyperplasia (BPH) and experiences frequent and urgent urination. He has not seen a urologist in a couple of years and is currently taking Flomax . He has not been prescribed medications like Avodart or finasteride before.  He experiences insomnia and is currently taking Dayvigo  10 mg, which helps him sleep if he is tired at night. He has tried other medications like trazodone , temazepam , Seroquel , and Belsomra  without success. He manages his fluid intake by hydrating early in the day to reduce nighttime urination.  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 He experienced a TIA in May 2023 and has had episodes of passing out, with the most recent occurring after February 2025. He attributes these episodes to standing with locked knees and feeling overheated. He has been evaluated by a neurologist and cardiologist, with no abnormalities found in EEG or cardiology workup. He takes aspirin , Plavix , and blood pressure medication to manage his condition.  He has a history of gout but is not currently taking  medication for it and reports no recent attacks. He also has a history of genital herpes and takes 1000 mg of medication daily. He experienced an outbreak when off the medication due to a prescription refill issue.  He reports feeling more alert after starting to drink an energy drink in the morning. He dislikes coffee and prefers the energy drink for its caffeine content.  He has a history of seasonal affective disorder, typically experiencing symptoms in January and February, but does not currently take medication for it.  He has a CPAP machine for obstructive sleep apnea but finds it intolerable, describing the sensation as 'drowning in air'.  He has a balanced diet, primarily eating out and choosing healthier options like salads and vegetables. He avoids fast food and prefers not to cook at home.  He is sexually active with the same partner and uses Viagra  as needed.    .   IPSS     Row Name 07/01/24 9078         International Prostate Symptom Score   How often have you had the sensation of not emptying your bladder? About half the time     How often have you had to urinate less than every two hours? About half the time     How often have you found you stopped and started again several times when you urinated? Less than 1 in 5 times     How often have you found it difficult to postpone urination? More than half the time     How often have  you had a weak urinary stream? Less than 1 in 5 times     How often have you had to strain to start urination? Not at All     How many times did you typically get up at night to urinate? 4 Times     Total IPSS Score 16       Quality of Life due to urinary symptoms   If you were to spend the rest of your life with your urinary condition just the way it is now how would you feel about that? Mostly Disatisfied        Diet: does not cook at home, eats out but chooses healthier choices  Exercise: discussed regular physical activity  Last Dental  Exam: up to date  Last Eye Exam: up to date   Depression: phq 9 is negative    07/01/2024    9:12 AM 10/22/2023    9:32 AM 09/06/2023    8:55 AM 06/27/2023   10:12 AM 04/17/2023   11:09 AM  Depression screen PHQ 2/9  Decreased Interest 0 0 1 0 0  Down, Depressed, Hopeless 0 0 1 0 0  PHQ - 2 Score 0 0 2 0 0  Altered sleeping  0 0 2 0  Tired, decreased energy  0 0 1 0  Change in appetite  0 0 0 0  Feeling bad or failure about yourself   0 0 0 0  Trouble concentrating  0 0 0 0  Moving slowly or fidgety/restless  0 0 0 0  Suicidal thoughts  0 0 0 0  PHQ-9 Score  0 2 3 0  Difficult doing work/chores  Not difficult at all Not difficult at all Somewhat difficult     Hypertension:  BP Readings from Last 3 Encounters:  07/01/24 128/76  10/22/23 132/74  09/14/23 132/78    Obesity: Wt Readings from Last 3 Encounters:  07/01/24 198 lb (89.8 kg)  10/22/23 200 lb (90.7 kg)  09/14/23 202 lb 9.6 oz (91.9 kg)   BMI Readings from Last 3 Encounters:  07/01/24 27.04 kg/m  10/22/23 27.31 kg/m  09/14/23 27.67 kg/m     Flowsheet Row Clinical Support from 09/06/2023 in [facility]  AUDIT-C Score 1     Divorced STD testing and prevention (HIV/chl/gon/syphilis):  not applicable Sexual history: same partner , uses viagra  as needed Hep C Screening: completed Skin cancer: Discussed monitoring for atypical lesions Colorectal cancer: repeat in 2027 Prostate cancer:  yes Lab Results  Component Value Date   PSA 1.29 06/15/2022   PSA 1.25 04/29/2021   PSA 0.9 07/02/2018     Lung cancer:  Low Dose CT Chest recommended if Age 41-80 years, 30 pack-year currently smoking OR have quit w/in 15years. Patient  is not a candidate for screening   AAA: The USPSTF recommends one-time screening with ultrasonography in men ages 62 to 75 years who have ever smoked. Patient   is not a candidate for screening  ECG:  2024  Vaccines: reviewed with the patient.   Advanced Care  Planning: A voluntary discussion about advance care planning including the explanation and discussion of advance directives.  Discussed health care proxy and Living will, and the patient was able to identify a health care proxy as daughter .  Patient does not have a living will and power of attorney of health care   Patient Active Problem List   Diagnosis Date Noted   History of right hemicolectomy 06/27/2023  Soft tissue mass 04/21/2023   Benign hypertension with chronic kidney disease, stage III (HCC) 10/16/2022   Prediabetes 10/16/2022   OSA (obstructive sleep apnea) 09/06/2022   History of TIA (transient ischemic attack) 09/06/2022   Hyperglycemia 09/06/2022   Vertebrobasilar insufficiency 01/18/2022   TIA (transient ischemic attack) 01/17/2022   BPH with obstruction/lower urinary tract symptoms 01/17/2022   Monoclonal gammopathy 07/13/2021   Chronic kidney disease 06/06/2021   Essential hypertension 06/06/2021   Sinus bradycardia 06/19/2018   Hyperlipidemia 11/02/2017   Herpes simplex infection of penis 11/01/2017   Erectile dysfunction 08/09/2016   Vitamin D  deficiency 08/09/2016   Blurred vision, bilateral 04/27/2016   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 The tumor was 2.6 cm,low grade with zero of 14 nodes positive. No evidence of venous or lymphatic invasion   COLONOSCOPY  [id number]   COLONOSCOPY WITH PROPOFOL  N/A 08/04/2015   Procedure: COLONOSCOPY WITH PROPOFOL ;  Surgeon: Reyes LELON Cota, MD;  Location: [facility] ENDOSCOPY;  Service: Endoscopy;  Laterality: N/A;   COLONOSCOPY WITH PROPOFOL  N/A 09/01/2020   Procedure: COLONOSCOPY WITH PROPOFOL ;  Surgeon: Cota Reyes LELON,  MD;  Location: [facility] ENDOSCOPY;  Service: Endoscopy;  Laterality: N/A;    Family History  Problem Relation Age of Onset   Cancer Brother        cancer of the thymus   Diabetes Mother    Hypertension Father    Stroke Father    Cancer Maternal Aunt        breast cancer   Hearing loss Paternal Grandfather     Social History   Socioeconomic History   Marital status: Divorced    Spouse name: Not on file   Number of children: 1   Years of education: 12   Highest education level: Some college, no degree  Occupational History   Not on file  Tobacco Use   Smoking status: Never    Passive exposure: Never   Smokeless tobacco: Never  Vaping Use   Vaping status: Never Used  Substance and Sexual Activity   Alcohol use: Not on file    Comment: Socially only - Very little   Drug use: No   Sexual activity: Yes    Birth control/protection: None  Other Topics Concern   Not on file  Social History Narrative   Pt lives alone; lives in [place]; worked in acupuncturist; retd Financial Trader. Non-smokers; Hx of marijuana smoking- when young; rare alcohol.    Social Drivers of Corporate Investment Banker Strain: Low Risk  (09/06/2023)   Overall Financial Resource Strain (CARDIA)    Difficulty of Paying Living Expenses: Not hard at all  Food Insecurity: No Food Insecurity (09/06/2023)   Hunger Vital Sign    Worried About Running Out of Food in the Last Year: Never true    Ran Out of Food in the Last Year: Never true  Transportation Needs: No Transportation Needs (09/06/2023)   PRAPARE - Administrator, Civil Service (Medical): No    Lack of Transportation (Non-Medical): No  Physical Activity: Insufficiently Active (09/06/2023)   Exercise Vital Sign    Days of Exercise per Week: 2 days    Minutes of Exercise per  Session: 30 min  Stress: No Stress Concern Present (09/06/2023)   [employer] of Occupational Health - Occupational Stress Questionnaire    Feeling of Stress : Not at  all  Social Connections: Moderately Integrated (09/06/2023)   Social Connection and Isolation Panel    Frequency of Communication with Friends and Family: More than three times a week    Frequency of Social Gatherings with Friends and Family: Twice a week    Attends Religious Services: 1 to 4 times per year    Active Member of [employer] or Organizations: Yes    Attends Engineer, Structural: More than 4 times per year    Marital Status: Divorced  Intimate Partner Violence: Not At Risk (09/06/2023)   Humiliation, Afraid, Rape, and Kick questionnaire    Fear of Current or Ex-Partner: No    Emotionally Abused: No    Physically Abused: No    Sexually Abused: No     Current Outpatient Medications:    Ascorbic Acid  (VITA-C PO), Take 1 tablet by mouth daily., Disp: , Rfl:    atorvastatin  (LIPITOR) 20 MG tablet, TAKE 1 TABLET BY MOUTH EVERY DAY, Disp: 90 tablet, Rfl: 0   clopidogrel  (PLAVIX ) 75 MG tablet, TAKE 1 TABLET BY MOUTH EVERY DAY, Disp: 90 tablet, Rfl: 0   DAYVIGO  10 MG TABS, TAKE 1 TABLET BY MOUTH EVERYDAY AT BEDTIME, Disp: 90 tablet, Rfl: 0   loratadine  (CLARITIN ) 10 MG tablet, Take 1 tablet (10 mg total) by mouth daily., Disp: 90 tablet, Rfl: 0   sildenafil  (VIAGRA ) 100 MG tablet, Take 0.5-1 tablets (50-100 mg total) by mouth daily as needed for erectile dysfunction., Disp: 30 tablet, Rfl: 0   tamsulosin  (FLOMAX ) 0.4 MG CAPS capsule, TAKE 1 CAPSULE BY MOUTH EVERY DAY, Disp: 90 capsule, Rfl: 0   valsartan  (DIOVAN ) 320 MG tablet, TAKE 1 TABLET BY MOUTH EVERY DAY, Disp: 90 tablet, Rfl: 0   valACYclovir  (VALTREX ) 1000 MG tablet, Take 1 tablet (1,000 mg total) by mouth daily., Disp: 90 tablet, Rfl: 0  Allergies  Allergen Reactions   Ace Inhibitors Swelling   Ciprofloxacin     Tendon damage   Jardiance  [Empagliflozin ] Rash     ROS  Ten systems reviewed and is negative except as mentioned in HPI     Objective  Vitals:   07/01/24 0924  BP: 128/76  Pulse: 64  Resp: 16   SpO2: 99%  Weight: 198 lb (89.8 kg)  Height: 5' 11.75 (1.822 m)    Body mass index is 27.04 kg/m.  Physical Exam  Constitutional: Patient appears well-developed and well-nourished. No distress.  HENT: Head: Normocephalic and atraumatic. Ears: B TMs ok, no erythema or effusion; Nose: Nose normal. Mouth/Throat: Oropharynx is clear and moist. No oropharyngeal exudate.  Eyes: Conjunctivae and EOM are normal. Pupils are equal, round, and reactive to light. No scleral icterus.  Neck: Normal range of motion. Neck supple. No JVD present. No thyromegaly present.  Cardiovascular: Normal rate, regular rhythm and normal heart sounds.  No murmur heard. No BLE edema. Pulmonary/Chest: Effort normal and breath sounds normal. No respiratory distress. Abdominal: Soft. Bowel sounds are normal, no distension. There is no tenderness. no masses MALE GENITALIA: Normal descended testes bilaterally, no masses palpated, no hernias, no lesions, no discharge RECTAL: Prostate normal  consistency, no nodules, but enlarged , no hemorrhoids Musculoskeletal: Normal range of motion, no joint effusions. No gross deformities Neurological: he is alert and oriented to person, place, and time. No cranial nerve deficit. Coordination, balance,  strength, speech and gait are normal.  Skin: Skin is warm and dry. No rash noted. No erythema.  Psychiatric: Patient has a normal mood and affect. behavior is normal. Judgment and thought content normal.      Assessment & Plan Hypertension with stage 3A chronic kidney disease Blood pressure controlled at 128/76 mmHg. Stable eGFR between 43 and 52. No kidney insufficiency symptoms. - Continue valsartan  320 mg daily. - Monitor kidney function. Consider SGLT2 agonist if decline.  Benign prostatic hyperplasia with lower urinary tract symptoms Frequent urination and urgency. IPSS score 16. On Flomax . No recent urology follow-up. - Prescribed finasteride. - Ordered PSA test. -  Advised urologist follow-up.  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 Medication sent on November 4th. - Ensured prescription sent to pharmacy.  Insomnia Dayvigo  10 mg somewhat effective. Sleep disrupted by nocturia. - Continue Dayvigo  10 mg as needed.  Obstructive sleep apnea Unable to tolerate CPAP. Drinking energy drinks to keep himself alert  History of transient ischemic attack and cerebral infarct TIA in May 2023. Neurology and cardiology evaluations normal. Blood pressure control crucial. - Continue aspirin  and Plavix . - Maintain blood pressure control.  Recurrent syncope Episodes possibly related to orthostatic changes versus vaso-vagal. No cause identified. - evaluated by neurologist and cardiologist  - Advised avoiding locking knees when standing.  Gout, unspecified No recent attacks. - Ordered uric acid level.  General Health Maintenance Received flu shot. Due for tetanus, shingles, and RSV vaccines. Colonoscopy due 2027. Eye exam scheduled. - Advised tetanus, shingles, and RSV vaccines at pharmacy. - Scheduled colonoscopy for 2027. - Continue regular eye exams.       -Prostate cancer screening and PSA options (with potential risks and benefits of testing vs not testing) were discussed along with recent recs/guidelines. -USPSTF grade A and B recommendations reviewed with patient; age-appropriate recommendations, preventive care, screening tests, etc discussed and encouraged; healthy living encouraged; see AVS for patient education given to patient -Discussed importance of 150 minutes of physical activity weekly, eat two servings of fish weekly, eat one serving of tree nuts ( cashews, pistachios, pecans, almonds.SABRA) every other day, eat 6 servings of fruit/vegetables daily and drink plenty of water and avoid sweet beverages.  -Reviewed Health Maintenance: yes

## 2024-07-02 ENCOUNTER — Ambulatory Visit: Payer: Self-pay | Admitting: Family Medicine

## 2024-07-02 LAB — CBC WITH DIFFERENTIAL/PLATELET
Absolute Lymphocytes: 2879 {cells}/uL (ref 850–3900)
Absolute Monocytes: 634 {cells}/uL (ref 200–950)
Basophils Absolute: 79 {cells}/uL (ref 0–200)
Basophils Relative: 1.3 %
Eosinophils Absolute: 293 {cells}/uL (ref 15–500)
Eosinophils Relative: 4.8 %
HCT: 43.9 % (ref 38.5–50.0)
Hemoglobin: 14.4 g/dL (ref 13.2–17.1)
MCH: 29.5 pg (ref 27.0–33.0)
MCHC: 32.8 g/dL (ref 32.0–36.0)
MCV: 90 fL (ref 80.0–100.0)
MPV: 11.7 fL (ref 7.5–12.5)
Monocytes Relative: 10.4 %
Neutro Abs: 2214 {cells}/uL (ref 1500–7800)
Neutrophils Relative %: 36.3 %
Platelets: 184 Thousand/uL (ref 140–400)
RBC: 4.88 Million/uL (ref 4.20–5.80)
RDW: 11.9 % (ref 11.0–15.0)
Total Lymphocyte: 47.2 %
WBC: 6.1 Thousand/uL (ref 3.8–10.8)

## 2024-07-02 LAB — COMPREHENSIVE METABOLIC PANEL WITH GFR
AG Ratio: 1.5 (calc) (ref 1.0–2.5)
ALT: 29 U/L (ref 9–46)
AST: 25 U/L (ref 10–35)
Albumin: 4.4 g/dL (ref 3.6–5.1)
Alkaline phosphatase (APISO): 58 U/L (ref 35–144)
BUN/Creatinine Ratio: 16 (calc) (ref 6–22)
BUN: 25 mg/dL (ref 7–25)
CO2: 28 mmol/L (ref 20–32)
Calcium: 9.4 mg/dL (ref 8.6–10.3)
Chloride: 107 mmol/L (ref 98–110)
Creat: 1.59 mg/dL — ABNORMAL HIGH (ref 0.70–1.28)
Globulin: 2.9 g/dL (ref 1.9–3.7)
Glucose, Bld: 108 mg/dL — ABNORMAL HIGH (ref 65–99)
Potassium: 5.4 mmol/L — ABNORMAL HIGH (ref 3.5–5.3)
Sodium: 142 mmol/L (ref 135–146)
Total Bilirubin: 0.5 mg/dL (ref 0.2–1.2)
Total Protein: 7.3 g/dL (ref 6.1–8.1)
eGFR: 46 mL/min/1.73m2 — ABNORMAL LOW (ref >=60)

## 2024-07-02 LAB — LIPID PANEL
Cholesterol: 124 mg/dL (ref <200)
HDL: 56 mg/dL (ref >=40)
LDL Cholesterol (Calc): 54 mg/dL
Non-HDL Cholesterol (Calc): 68 mg/dL (ref <130)
Total CHOL/HDL Ratio: 2.2 (calc) (ref <5.0)
Triglycerides: 49 mg/dL (ref <150)

## 2024-07-02 LAB — HEMOGLOBIN A1C
Hgb A1c MFr Bld: 6 % — ABNORMAL HIGH (ref <5.7)
Mean Plasma Glucose: 126 mg/dL
eAG (mmol/L): 7 mmol/L

## 2024-07-02 LAB — PSA: PSA: 2.07 ng/mL (ref <=4.00)

## 2024-07-07 DIAGNOSIS — H52223 Regular astigmatism, bilateral: Secondary | ICD-10-CM | POA: Diagnosis not present

## 2024-08-12 DIAGNOSIS — G4733 Obstructive sleep apnea (adult) (pediatric): Secondary | ICD-10-CM | POA: Diagnosis not present

## 2024-09-11 ENCOUNTER — Ambulatory Visit: Payer: Self-pay

## 2024-09-11 DIAGNOSIS — Z0000 Encounter for general adult medical examination without abnormal findings: Secondary | ICD-10-CM

## 2024-09-11 NOTE — Patient Instructions (Addendum)
 [name],  Thank you for taking the time for your Medicare Wellness Visit. I appreciate your continued commitment to your health goals. Please review the care plan we discussed, and feel free to reach out if I can assist you further.  Please note that Annual Wellness Visits do not include a physical exam. Some assessments may be limited, especially if the visit was conducted virtually. If needed, we may recommend an in-person follow-up with your provider.  Ongoing Care Seeing your primary care provider every 3 to 6 months helps us  monitor your health and provide consistent, personalized care. 12/29/24 @ 9:00 AM APPT W/ DR.SOWLES  Referrals If a referral was made during today's visit and you haven't received any updates within two weeks, please contact the referred provider directly to check on the status.  Recommended Screenings:  Health Maintenance  Topic Date Due   DTaP/Tdap/Td vaccine (2 - Td or Tdap) 02/01/2023   COVID-19 Vaccine (3 - 2025-26 season) 04/28/2024   Zoster (Shingles) Vaccine (1 of 2) 09/30/2024*   Colon Cancer Screening  09/01/2025   Medicare Annual Wellness Visit  09/11/2025   Pneumococcal Vaccine for age over 12  Completed   Flu Shot  Completed   Hepatitis C Screening  Completed   Meningitis B Vaccine  Aged Out  *Topic was postponed. The date shown is not the original due date.     Vision: Annual vision screenings are recommended for early detection of glaucoma, cataracts, and diabetic retinopathy. These exams can also reveal signs of chronic conditions such as diabetes and high blood pressure.  Dental: Annual dental screenings help detect early signs of oral cancer, gum disease, and other conditions linked to overall health, including heart disease and diabetes.  Please see the attached documents for additional preventive care recommendations.   NEXT AWV 09/17/25 @ 8:10 AM BY VIDEO

## 2024-09-11 NOTE — Progress Notes (Signed)
 "  Chief Complaint  Patient presents with   Medicare Wellness     Subjective:   Frederick Nelson is a 74 y.o. male who presents for a Medicare Annual Wellness Visit.  Visit info / Clinical Intake: Medicare Wellness Visit Type:: Subsequent Annual Wellness Visit Persons participating in visit and providing information:: patient Medicare Wellness Visit Mode:: Video Since this visit was completed virtually, some vitals may be partially provided or unavailable. Missing vitals are due to the limitations of the virtual format.: Unable to obtain vitals - no equipment If Telephone or Video please confirm:: I connected with patient using audio/video enable telemedicine. I verified patient identity with two identifiers, discussed telehealth limitations, and patient agreed to proceed. Patient Location:: HOME Provider Location:: OFFICE Interpreter Needed?: No Pre-visit prep was completed: yes AWV questionnaire completed by patient prior to visit?: yes Date:: 09/09/24 Living arrangements:: (!) lives alone Patient's Overall Health Status Rating: (!) fair Typical amount of pain: none Does pain affect daily life?: no Are you currently prescribed opioids?: no  Dietary Habits and Nutritional Risks How many meals a day?: 2 Eats fruit and vegetables daily?: yes Most meals are obtained by: eating out In the last 2 weeks, have you had any of the following?: (!) nausea, vomiting, diarrhea (OCC. DIARRHEA) Diabetic:: no  Functional Status Activities of Daily Living (to include ambulation/medication): Independent Ambulation: Independent Medication Administration: Independent Home Management (perform basic housework or laundry): Independent Manage your own finances?: yes Primary transportation is: driving Concerns about vision?: no *vision screening is required for [facility]* (WEARS GLASSES ALL DAY- [place])  Fall Screening Falls in the past year?: 1 Number of falls in past year: (Patient-Rptd) 0 Was  there an injury with Fall?: (Patient-Rptd) 0 Fall Risk Category Calculator: (Patient-Rptd) 1 Patient Fall Risk Level: (Patient-Rptd) Low Fall Risk  Fall Risk Patient at Risk for Falls Due to: Impaired balance/gait Fall risk Follow up: Falls evaluation completed  Home and Transportation Safety: All rugs have non-skid backing?: yes All stairs or steps have railings?: yes Grab bars in the bathtub or shower?: yes Have non-skid surface in bathtub or shower?: yes Good home lighting?: yes Regular seat belt use?: yes Hospital stays in the last year:: no  Cognitive Assessment Difficulty concentrating, remembering, or making decisions? : yes    Allergies (verified) Ace inhibitors, Ciprofloxacin, and Jardiance  [empagliflozin ]   Current Medications (verified) Outpatient Encounter Medications as of 09/11/2024  Medication Sig   Ascorbic Acid  (VITA-C PO) Take 1 tablet by mouth daily.   atorvastatin  (LIPITOR) 20 MG tablet Take 1 tablet (20 mg total) by mouth daily.   clopidogrel  (PLAVIX ) 75 MG tablet Take 1 tablet (75 mg total) by mouth daily.   finasteride  (PROSCAR ) 5 MG tablet Take 1 tablet (5 mg total) by mouth daily.   Lemborexant  (DAYVIGO ) 10 MG TABS Take 1 tablet (10 mg total) by mouth at bedtime.   loratadine  (CLARITIN ) 10 MG tablet Take 1 tablet (10 mg total) by mouth daily. (Patient taking differently: Take 10 mg by mouth daily. TAKES PRN)   sildenafil  (VIAGRA ) 100 MG tablet Take 0.5-1 tablets (50-100 mg total) by mouth daily as needed for erectile dysfunction.   tamsulosin  (FLOMAX ) 0.4 MG CAPS capsule Take 1 capsule (0.4 mg total) by mouth daily.   valACYclovir  (VALTREX ) 1000 MG tablet Take 1 tablet (1,000 mg total) by mouth daily.   valsartan  (DIOVAN ) 320 MG tablet Take 1 tablet (320 mg total) by mouth daily.   No facility-administered encounter medications on file as of 09/11/2024.  History: Past Medical History:  Diagnosis Date   Allergy     Arthritis    Benign hypertension  with chronic kidney disease, stage III (HCC)    Chronic low back pain    CKD (chronic kidney disease), stage III (HCC)    Degenerative joint disease (DJD) of hip    Genital herpes    Takes Valtrex  for prevention   GERD (gastroesophageal reflux disease) 1995   Hyperlipidemia    Hypertension    Malignant neoplasm of ascending colon (HCC) 2011   Special screening for malignant neoplasms, colon 2011   Stroke [facility])    Past Surgical History:  Procedure Laterality Date   COLON SURGERY  06/26/2007   right hemicolectomy-T2N0 carcinoma of right colon. The tumor was 2.6 cm,low grade with zero of 14 nodes positive. No evidence of venous or lymphatic invasion   COLONOSCOPY  [id number]   COLONOSCOPY WITH PROPOFOL  N/A 08/04/2015   Procedure: COLONOSCOPY WITH PROPOFOL ;  Surgeon: Reyes LELON Cota, MD;  Location: [facility] ENDOSCOPY;  Service: Endoscopy;  Laterality: N/A;   COLONOSCOPY WITH PROPOFOL  N/A 09/01/2020   Procedure: COLONOSCOPY WITH PROPOFOL ;  Surgeon: Cota Reyes LELON, MD;  Location: [facility] ENDOSCOPY;  Service: Endoscopy;  Laterality: N/A;   Family History  Problem Relation Age of Onset   Cancer Brother        cancer of the thymus   Diabetes Mother    Hypertension Father    Stroke Father    Cancer Maternal Aunt        breast cancer   Hearing loss Paternal Grandfather    Social History   Occupational History   Not on file  Tobacco Use   Smoking status: Never    Passive exposure: Never   Smokeless tobacco: Never  Vaping Use   Vaping status: Never Used  Substance and Sexual Activity   Alcohol use: Not on file    Comment: Socially only - Very little   Drug use: No   Sexual activity: Yes    Birth control/protection: None   Tobacco Counseling Counseling given: Not Answered  SDOH Screenings   Food Insecurity: No Food Insecurity (09/09/2024)  Housing: Low Risk (09/09/2024)  Transportation Needs: No Transportation Needs (09/09/2024)  Utilities: Not At Risk (09/06/2023)  Alcohol  Screen: Low Risk (09/09/2024)  Depression (PHQ2-9): Low Risk (07/01/2024)  Financial Resource Strain: Low Risk (09/09/2024)  Physical Activity: Insufficiently Active (09/09/2024)  Social Connections: Moderately Integrated (09/09/2024)  Stress: No Stress Concern Present (09/09/2024)  Tobacco Use: Low Risk (09/11/2024)  Health Literacy: Adequate Health Literacy (09/06/2023)   See flowsheets for full screening details  Depression Screen PHQ 2 & 9 Depression Scale- Over the past 2 weeks, how often have you been bothered by any of the following problems? Little interest or pleasure in doing things: 0 Feeling down, depressed, or hopeless (PHQ Adolescent also includes...irritable): 0 PHQ-2 Total Score: 0 Trouble falling or staying asleep, or sleeping too much: 0 Feeling tired or having little energy: 0 Poor appetite or overeating (PHQ Adolescent also includes...weight loss): 0 Feeling bad about yourself - or that you are a failure or have let yourself or your family down: 0 Trouble concentrating on things, such as reading the newspaper or watching television (PHQ Adolescent also includes...like school work): 0 Moving or speaking so slowly that other people could have noticed. Or the opposite - being so fidgety or restless that you have been moving around a lot more than usual: 0 Thoughts that you would be better off dead, or of  hurting yourself in some way: 0 PHQ-9 Total Score: 0 If you checked off any problems, how difficult have these problems made it for you to do your work, take care of things at home, or get along with other people?: Not difficult at all     Goals Addressed             This Visit's Progress    DIET - INCREASE WATER INTAKE               Objective:    There were no vitals filed for this visit. There is no height or weight on file to calculate BMI.  Hearing/Vision screen No results found. Immunizations and Health Maintenance Health Maintenance  Topic Date Due    DTaP/Tdap/Td (2 - Td or Tdap) 02/01/2023   COVID-19 Vaccine (3 - 2025-26 season) 04/28/2024   Zoster Vaccines- Shingrix  (1 of 2) 09/30/2024 (Originally 12/12/2001)   Colonoscopy  09/01/2025   Medicare Annual Wellness (AWV)  09/11/2025   Pneumococcal Vaccine: 50+ Years  Completed   Influenza Vaccine  Completed   Hepatitis C Screening  Completed   Meningococcal B Vaccine  Aged Out        Assessment/Plan:  This is a routine wellness examination for Laken.  Patient Care Team: Sowles, Krichna, MD as PCP - General (Family Medicine) Dessa, Reyes ORN, MD as Consulting Physician (General Surgery) Lateef, Munsoor, MD (Nephrology) Rennie Cindy SAUNDERS, MD as Consulting Physician (Internal Medicine) Elma Zachary RAMAN [facility])  I have personally reviewed and noted the following in the patients chart:   Medical and social history Use of alcohol, tobacco or illicit drugs  Current medications and supplements including opioid prescriptions. Functional ability and status Nutritional status Physical activity Advanced directives List of other physicians Hospitalizations, surgeries, and ER visits in previous 12 months Vitals Screenings to include cognitive, depression, and falls Referrals and appointments  No orders of the defined types were placed in this encounter.  In addition, I have reviewed and discussed with patient certain preventive protocols, quality metrics, and best practice recommendations. A written personalized care plan for preventive services as well as general preventive health recommendations were provided to patient.   Jhonnie RAMAN Das, LPN   [id number]   Return in 1 year (on 09/11/2025).  After Visit Summary: (MyChart) Due to this being a telephonic visit, the after visit summary with patients personalized plan was offered to patient via MyChart   Nurse Notes: NEEDS TDAP, SHINGRIX ; UTD ON COLONOSCOPY   "

## 2024-12-29 ENCOUNTER — Ambulatory Visit: Admitting: Family Medicine

## 2025-09-17 ENCOUNTER — Ambulatory Visit
